# Patient Record
Sex: Male | Born: 1955 | Race: White | Hispanic: No | Smoking: Former smoker
Health system: Southern US, Community
[De-identification: ages and names within clinical notes are randomized; demographics above are authoritative.]

## PROBLEM LIST (undated history)

## (undated) DIAGNOSIS — R5382 Chronic fatigue, unspecified: Secondary | ICD-10-CM

## (undated) DIAGNOSIS — Z972 Presence of dental prosthetic device (complete) (partial): Secondary | ICD-10-CM

## (undated) DIAGNOSIS — C3491 Malignant neoplasm of unspecified part of right bronchus or lung: Secondary | ICD-10-CM

## (undated) DIAGNOSIS — E039 Hypothyroidism, unspecified: Secondary | ICD-10-CM

## (undated) DIAGNOSIS — K635 Polyp of colon: Secondary | ICD-10-CM

## (undated) DIAGNOSIS — I10 Essential (primary) hypertension: Secondary | ICD-10-CM

## (undated) DIAGNOSIS — E785 Hyperlipidemia, unspecified: Secondary | ICD-10-CM

## (undated) DIAGNOSIS — F209 Schizophrenia, unspecified: Secondary | ICD-10-CM

## (undated) HISTORY — PX: COLONOSCOPY: SHX174

## (undated) HISTORY — DX: Malignant neoplasm of unspecified part of right bronchus or lung: C34.91

---

## 2004-03-10 ENCOUNTER — Ambulatory Visit: Payer: Self-pay | Admitting: Internal Medicine

## 2004-03-23 ENCOUNTER — Ambulatory Visit: Payer: Self-pay | Admitting: Internal Medicine

## 2004-04-06 ENCOUNTER — Ambulatory Visit: Payer: Self-pay | Admitting: Internal Medicine

## 2008-09-10 ENCOUNTER — Ambulatory Visit: Payer: Self-pay | Admitting: Gastroenterology

## 2014-07-03 ENCOUNTER — Ambulatory Visit: Payer: Self-pay | Admitting: Internal Medicine

## 2014-07-03 ENCOUNTER — Ambulatory Visit
Admit: 2014-07-03 | Disposition: A | Discharge status: Home / Self Care | Payer: Self-pay | Attending: Internal Medicine | Admitting: Internal Medicine

## 2014-07-09 ENCOUNTER — Ambulatory Visit
Admit: 2014-07-09 | Disposition: A | Discharge status: Home / Self Care | Payer: Self-pay | Attending: Internal Medicine | Admitting: Internal Medicine

## 2014-07-11 LAB — CBC CANCER CENTER
Basophil #: 0 x10 3/mm (ref 0.0–0.1)
Basophil %: 0.1 %
EOS PCT: 1.1 %
Eosinophil #: 0.1 x10 3/mm (ref 0.0–0.7)
HCT: 36.4 % — ABNORMAL LOW (ref 40.0–52.0)
HGB: 12 g/dL — ABNORMAL LOW (ref 13.0–18.0)
Lymphocyte #: 1.7 x10 3/mm (ref 1.0–3.6)
Lymphocyte %: 16.7 %
MCH: 30.1 pg (ref 26.0–34.0)
MCHC: 33 g/dL (ref 32.0–36.0)
MCV: 91 fL (ref 80–100)
MONO ABS: 1 x10 3/mm (ref 0.2–1.0)
MONOS PCT: 10.5 %
Neutrophil #: 7.1 x10 3/mm — ABNORMAL HIGH (ref 1.4–6.5)
Neutrophil %: 71.6 %
PLATELETS: 386 x10 3/mm (ref 150–440)
RBC: 4 10*6/uL — ABNORMAL LOW (ref 4.40–5.90)
RDW: 11.9 % (ref 11.5–14.5)
WBC: 9.9 x10 3/mm (ref 3.8–10.6)

## 2014-07-11 LAB — HEPATIC FUNCTION PANEL A (ARMC)
ALBUMIN: 3.4 g/dL — AB
ALT: 115 U/L — AB
Alkaline Phosphatase: 186 U/L — ABNORMAL HIGH
Bilirubin, Direct: <0.1 mg/dL
Bilirubin,Total: 0.3 mg/dL
SGOT(AST): 87 U/L — ABNORMAL HIGH
TOTAL PROTEIN: 6.9 g/dL

## 2014-07-11 LAB — CREATININE, SERUM
CREATININE: 1.16 mg/dL
EGFR (African American): >60
EGFR (Non-African Amer.): >60

## 2014-07-11 LAB — CALCIUM: CALCIUM: 9.2 mg/dL

## 2014-07-14 LAB — CEA: CEA: 3.6 ng/mL (ref 0.0–4.7)

## 2014-07-17 ENCOUNTER — Ambulatory Visit
Admit: 2014-07-17 | Disposition: A | Discharge status: Home / Self Care | Payer: Self-pay | Attending: Internal Medicine | Admitting: Internal Medicine

## 2014-07-18 ENCOUNTER — Ambulatory Visit
Admit: 2014-07-18 | Disposition: A | Discharge status: Home / Self Care | Payer: Self-pay | Attending: Internal Medicine | Admitting: Internal Medicine

## 2014-07-23 LAB — CBC CANCER CENTER
BASOS PCT: 0.1 %
Basophil #: 0 x10 3/mm (ref 0.0–0.1)
Eosinophil #: 0.1 x10 3/mm (ref 0.0–0.7)
Eosinophil %: 0.6 %
HCT: 37.4 % — ABNORMAL LOW (ref 40.0–52.0)
HGB: 12.6 g/dL — AB (ref 13.0–18.0)
Lymphocyte #: 2.2 x10 3/mm (ref 1.0–3.6)
Lymphocyte %: 21.9 %
MCH: 30.2 pg (ref 26.0–34.0)
MCHC: 33.7 g/dL (ref 32.0–36.0)
MCV: 90 fL (ref 80–100)
Monocyte #: 1 x10 3/mm (ref 0.2–1.0)
Monocyte %: 10.3 %
Neutrophil #: 6.6 x10 3/mm — ABNORMAL HIGH (ref 1.4–6.5)
Neutrophil %: 67.1 %
Platelet: 470 x10 3/mm — ABNORMAL HIGH (ref 150–440)
RBC: 4.17 10*6/uL — AB (ref 4.40–5.90)
RDW: 12 % (ref 11.5–14.5)
WBC: 9.9 x10 3/mm (ref 3.8–10.6)

## 2014-07-23 LAB — HEPATIC FUNCTION PANEL A (ARMC)
ALT: 24 U/L
AST: 24 U/L
Albumin: 4.2 g/dL
Alkaline Phosphatase: 108 U/L
Bilirubin, Direct: <0.1 mg/dL
Bilirubin,Total: 0.5 mg/dL
Total Protein: 7.6 g/dL

## 2014-07-23 LAB — CREATININE, SERUM
Creatinine: 1.02 mg/dL
EGFR (African American): >60

## 2014-07-23 LAB — APTT: Activated PTT: 35.3 secs (ref 23.6–35.9)

## 2014-07-23 LAB — PROTIME-INR
INR: 1
PROTHROMBIN TIME: 13.3 s

## 2014-07-28 ENCOUNTER — Ambulatory Visit
Admit: 2014-07-28 | Disposition: A | Discharge status: Home / Self Care | Payer: Self-pay | Attending: Internal Medicine | Admitting: Internal Medicine

## 2014-07-28 LAB — BASIC METABOLIC PANEL
Anion Gap: 9 (ref 7–16)
BUN: 19 mg/dL
CO2: 25 mmol/L
Calcium, Total: 9.1 mg/dL
Chloride: 101 mmol/L
Creatinine: 1.36 mg/dL — ABNORMAL HIGH
GFR CALC NON AF AMER: 57 — AB
Glucose: 135 mg/dL — ABNORMAL HIGH
POTASSIUM: 4.3 mmol/L
SODIUM: 135 mmol/L

## 2014-07-30 ENCOUNTER — Ambulatory Visit
Admit: 2014-07-30 | Disposition: A | Discharge status: Home / Self Care | Payer: Self-pay | Attending: Internal Medicine | Admitting: Internal Medicine

## 2014-07-30 HISTORY — PX: ENDOBRONCHIAL ULTRASOUND: SHX5096

## 2014-07-31 ENCOUNTER — Institutional Professional Consult (permissible substitution): Payer: Self-pay | Admitting: Internal Medicine

## 2014-08-06 ENCOUNTER — Institutional Professional Consult (permissible substitution): Payer: Self-pay | Admitting: Pulmonary Disease

## 2014-08-11 LAB — CYTOLOGY - NON PAP

## 2014-08-18 ENCOUNTER — Ambulatory Visit: Payer: Medicare Other | Admitting: Radiation Oncology

## 2014-08-18 NOTE — Consult Note (Signed)
Reason for Visit: This 59 year old Male patient presents to the clinic for initial evaluation of  Lung cancer .   Referred by Dr Ma Hillock.  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  Pathology Report Pathology report reviewed   Imaging Report PET/CT and CT scans reviewed   Referral Report Clinical notes reviewed   Planned Treatment Regimen Concurrent chemotherapy and radiation therapy with curative intent   HPI   Patient is a 59 year old male presented with some slight weight loss and increasing dyspnea on exertion and intermittent cough. Chest x-ray and CT scan demonstrated a right hilar mass. Went on to have a PET CT scan demonstrating hypermetabolic central right hilar node concerning for primary bronchogenic carcinoma. There is also hypermetabolic nodule in the left upper lobe which may represent a secondary primary. He also had to hypermetabolic nodules in the right middle lobe concerning for either primary disease or metastatic disease. Bronchoscopy was performed and cytology was positive for squamous cell carcinoma. I have been asked to die with the patient for possible radiation therapy. He is doing fairly well. Having no pain at this time. PET CT did not demonstrate any disease outside his chest.  Past, Family and Social History:  Past Medical History positive   Cardiovascular hyperlipidemia; hypertension   Endocrine diabetes mellitus; hypothyroidism   Past Medical History Comments Chronic fatigue   Family History positive   Family History Comments Family history of stomach cancer   Social History positive   Social History Comments 30 year pack smoking history claims only 5 cigarettes a day. Social EtOH use history   Additional Past Medical and Surgical History Seen by himself today   Allergies:   No Known Allergies:    Home Meds:  Home Medications: Medication Instructions Status  dexamethasone 4 mg oral tablet Take 2 tablets  (8 mg) orally at 10PM the night before and 2 tablets (8 mg) at 7AM the morning of Taxol chemo.  (first dose of Taxol treatment is planned on 08/26/14) Active  hydrochlorothiazide 25 mg oral tablet 1 tab(s) orally once a day Active  levothyroxine 88 mcg (0.088 mg) oral capsule 1 cap(s) orally once a day Active  lisinopril 5 mg oral tablet 1 tab(s) orally once a day Active  OLANZapine 20 mg oral tablet 2 tab(s) orally once a day (at bedtime) Active  atorvastatin 10 mg oral tablet 1 tab(s) orally once a day (at bedtime) Active  Vitamin D 50,000 units 1 tab(s) orally once a week Active  propranolol 10 mg oral tablet 1 tab(s) orally 3 times a day Active   Review of Systems:  General negative   Performance Status (ECOG) 0   Skin negative   Breast negative   Ophthalmologic negative   ENMT negative   Respiratory and Thorax negative   Cardiovascular negative   Gastrointestinal negative   Genitourinary negative   Musculoskeletal negative   Neurological negative   Psychiatric negative   Hematology/Lymphatics negative   Endocrine negative   Allergic/Immunologic negative   Review of Systems   denies any weight loss, fatigue, weakness, fever, chills or night sweats. Patient denies any loss of vision, blurred vision. Patient denies any ringing  of the ears or hearing loss. No irregular heartbeat. Patient denies heart murmur or history of fainting. Patient denies any chest pain or pain radiating to her upper extremities. Patient denies any  shortness of breath, difficulty breathing at night, cough or hemoptysis. Patient denies any swelling in the lower legs. Patient denies any nausea vomiting, vomiting of blood, or coffee ground material in the vomitus. Patient denies any stomach pain. Patient states has had normal bowel movements no significant constipation or diarrhea.  Patient denies any dysuria, hematuria or significant nocturia. Patient denies any problems walking, swelling in the joints or loss of balance. Patient denies any skin changes, loss of hair or loss of weight. Patient denies any excessive worrying or anxiety or significant depression. Patient denies any problems with insomnia. Patient denies excessive thirst, polyuria, polydipsia. Patient denies any swollen glands, patient denies easy bruising or easy bleeding. Patient denies any recent infections, allergies or URI. Patient "s visual fields have not changed significantly in recent time.   Physical Exam:  General/Skin/HEENT:  General normal   Skin normal   Eyes normal   ENMT normal   Head and Neck normal   Additional PE Well-developed well-nourished male in NAD. Lungs are clear to A&P cardiac examination shows regular rate and rhythm abdomen is benign. No cervical or supraclavicular adenopathy is identified.   Breasts/Resp/CV/GI/GU:  Respiratory and Thorax normal   Cardiovascular normal   Gastrointestinal normal   Genitourinary normal   MS/Neuro/Psych/Lymph:  Musculoskeletal normal   Neurological normal   Lymphatics normal   Other Results:  Radiology Results: Nuclear Med:    31-Mar-16 09:57, PET/CT Scan Lung Cancer Diagnosis  PET/CT Scan Lung Cancer Diagnosis   REASON FOR EXAM:    Newly Diagnosed RUL Lung Nodule  COMMENTS:       PROCEDURE: PET - PET/CT DX LUNG CA  - Jul 17 2014  9:57AM     CLINICAL DATA:  Initial treatment strategy for lung carcinoma. Right  upper lobe lung nodule.Marland Kitchen    EXAM:  NUCLEAR MEDICINE PET SKULL BASE TO THIGH    TECHNIQUE:  12.8 mCi F-18 FDG was injected intravenously. Full-ring PET imaging  was performed from the skull base to thigh after the radiotracer. CT  data was obtained and used for attenuation correction and anatomic  localization.    FASTING BLOOD GLUCOSE:  Value: 105 mg/dl    COMPARISON:  CT  03/23/2004    FINDINGS:  NECK    No hypermetabolic lymph nodes in the neck.    CHEST    There is a central hypermetabolic nodule in the right hilum which is  difficult to measure on the noncontrast CT - 15 mm (image 163 of  series 3). This nodule has intense metabolic activity SUV max equal  16.5. Peripheral to this nodule there is right upper lobe  consolidation. This consolidative process measures 5.1 x 3.0 cm and  has moderate metabolic activity with SUV max equaled 8.8.    There is a second hypermetabolic nodule in the right middle lobe  which is small measuring 5 mm with SUV max equal 2.6. Additional  hypermetabolic nodule along the right middle lobe bronchus on image  112 of the fused data set.    Within the left upper lobe 15 mm irregular nodule (image 132, series  3) has moderate metabolic activity with SUV max equal 4.6    ABDOMEN/PELVIS  No abnormal metabolic activity within liver. Gallbladder normal. No  hypermetabolic abdominal lymph nodes. Mild activity through the  distal esophagus likely related to esophagitis.    SKELETON    No focal hypermetabolic activity to suggest skeletal metastasis.     IMPRESSION:  1. Hypermetabolic central right hilar nodule  is concern for primary  bronchogenic carcinoma.  2. Consolidative process peripheral to this central nodule is felt  to represent postobstructive pneumonitis; however cannot exclude  local tumor extension.  3. Hypermetabolic nodule associated witha central right middle lobe  bronchus is concern for other foci of carcinoma.  4. Hypermetabolic nodule in the parenchyma of the right middle lobe  concern for metastatic disease.  5. Hypermetabolic nodule the left upper lobe either represents a  secondary primary or metastatic disease.  6. No evidence of hypermetabolic mediastinal lymph nodes or distant  metastasis.      Electronically Signed    By: Suzy Bouchard M.D.    On: 07/17/2014 11:13          Verified By: Rennis Golden, M.D.,   Relevent Results:   Relevant Scans and Labs PET/CT and CT scans are reviewed and compatible with the above-stated findings.   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 Plan:   I have personally discussed the case with medical oncology. Can treat the entire hypermetabolic regions of the right lung within 1 treatment field with curative intent along with systemic chemotherapy. Faint left lung nodule which is slightly hypermetabolic I would continue to observe at this time and could always use SB RT on that if it becomes clear we are dealing with another malignancy. Risks and benefits of treatment including increasing dysphagia from possible radiation esophagitis, alteration of blood counts, increasing pulmonary symptoms secondary to loss of normal lung volume skin reaction fatigue all were discussed in detail with the patient. He seems to comprehend my treatment plan well. I have set up and ordered CT simulation on him for next week. Patient also be scheduled for concurrent chemotherapy.  I would like to take this opportunity for allowing me to participate in the care of your patient..  Electronic Signatures: Armstead Peaks (MD)  (Signed [phone] 11:42)  Authored: HPI, Diagnosis, PFSH, Allergies, Home Meds, ROS, Physical Exam, Other Results, Relevent Results, Encounter Assessment and Plan   Last Updated: 02-May-16 11:42 by Armstead Peaks (MD)

## 2014-08-19 ENCOUNTER — Inpatient Hospital Stay: Payer: Medicare Other | Attending: Internal Medicine

## 2014-08-19 DIAGNOSIS — Z79899 Other long term (current) drug therapy: Secondary | ICD-10-CM | POA: Insufficient documentation

## 2014-08-19 DIAGNOSIS — J449 Chronic obstructive pulmonary disease, unspecified: Secondary | ICD-10-CM | POA: Insufficient documentation

## 2014-08-19 DIAGNOSIS — E785 Hyperlipidemia, unspecified: Secondary | ICD-10-CM | POA: Insufficient documentation

## 2014-08-19 DIAGNOSIS — Z5111 Encounter for antineoplastic chemotherapy: Secondary | ICD-10-CM | POA: Insufficient documentation

## 2014-08-19 DIAGNOSIS — I10 Essential (primary) hypertension: Secondary | ICD-10-CM | POA: Insufficient documentation

## 2014-08-19 DIAGNOSIS — R5382 Chronic fatigue, unspecified: Secondary | ICD-10-CM | POA: Insufficient documentation

## 2014-08-19 DIAGNOSIS — C3411 Malignant neoplasm of upper lobe, right bronchus or lung: Secondary | ICD-10-CM | POA: Insufficient documentation

## 2014-08-19 DIAGNOSIS — E119 Type 2 diabetes mellitus without complications: Secondary | ICD-10-CM | POA: Insufficient documentation

## 2014-08-19 DIAGNOSIS — R918 Other nonspecific abnormal finding of lung field: Secondary | ICD-10-CM | POA: Insufficient documentation

## 2014-08-19 NOTE — Patient Instructions (Addendum)
Paclitaxel injection What is this medicine? PACLITAXEL (PAK li TAX el) is a chemotherapy drug. It targets fast dividing cells, like cancer cells, and causes these cells to die. This medicine is used to treat ovarian cancer, breast cancer, and other cancers. This medicine may be used for other purposes; ask your health care provider or pharmacist if you have questions. COMMON BRAND NAME(S): Onxol, Taxol What should I tell my health care provider before I take this medicine? They need to know if you have any of these conditions: -blood disorders -irregular heartbeat -infection (especially a virus infection such as chickenpox, cold sores, or herpes) -liver disease -previous or ongoing radiation therapy -an unusual or allergic reaction to paclitaxel, alcohol, polyoxyethylated castor oil, other chemotherapy agents, other medicines, foods, dyes, or preservatives -pregnant or trying to get pregnant -breast-feeding How should I use this medicine? This drug is given as an infusion into a vein. It is administered in a hospital or clinic by a specially trained health care professional. Talk to your pediatrician regarding the use of this medicine in children. Special care may be needed. Overdosage: If you think you have taken too much of this medicine contact a poison control center or emergency room at once. NOTE: This medicine is only for you. Do not share this medicine with others. What if I miss a dose? It is important not to miss your dose. Call your doctor or health care professional if you are unable to keep an appointment. What may interact with this medicine? Do not take this medicine with any of the following medications: -disulfiram -metronidazole This medicine may also interact with the following medications: -cyclosporine -diazepam -ketoconazole -medicines to increase blood counts like filgrastim, pegfilgrastim, sargramostim -other chemotherapy drugs like cisplatin, doxorubicin,  epirubicin, etoposide, teniposide, vincristine -quinidine -testosterone -vaccines -verapamil Talk to your doctor or health care professional before taking any of these medicines: -acetaminophen -aspirin -ibuprofen -ketoprofen -naproxen This list may not describe all possible interactions. Give your health care provider a list of all the medicines, herbs, non-prescription drugs, or dietary supplements you use. Also tell them if you smoke, drink alcohol, or use illegal drugs. Some items may interact with your medicine. What should I watch for while using this medicine? Your condition will be monitored carefully while you are receiving this medicine. You will need important blood work done while you are taking this medicine. This drug may make you feel generally unwell. This is not uncommon, as chemotherapy can affect healthy cells as well as cancer cells. Report any side effects. Continue your course of treatment even though you feel ill unless your doctor tells you to stop. In some cases, you may be given additional medicines to help with side effects. Follow all directions for their use. Call your doctor or health care professional for advice if you get a fever, chills or sore throat, or other symptoms of a cold or flu. Do not treat yourself. This drug decreases your body's ability to fight infections. Try to avoid being around people who are sick. This medicine may increase your risk to bruise or bleed. Call your doctor or health care professional if you notice any unusual bleeding. Be careful brushing and flossing your teeth or using a toothpick because you may get an infection or bleed more easily. If you have any dental work done, tell your dentist you are receiving this medicine. Avoid taking products that contain aspirin, acetaminophen, ibuprofen, naproxen, or ketoprofen unless instructed by your doctor. These medicines may hide a fever.   Do not become pregnant while taking this medicine.  Women should inform their doctor if they wish to become pregnant or think they might be pregnant. There is a potential for serious side effects to an unborn child. Talk to your health care professional or pharmacist for more information. Do not breast-feed an infant while taking this medicine. Men are advised not to father a child while receiving this medicine. What side effects may I notice from receiving this medicine? Side effects that you should report to your doctor or health care professional as soon as possible: -allergic reactions like skin rash, itching or hives, swelling of the face, lips, or tongue -low blood counts - This drug may decrease the number of white blood cells, red blood cells and platelets. You may be at increased risk for infections and bleeding. -signs of infection - fever or chills, cough, sore throat, pain or difficulty passing urine -signs of decreased platelets or bleeding - bruising, pinpoint red spots on the skin, black, tarry stools, nosebleeds -signs of decreased red blood cells - unusually weak or tired, fainting spells, lightheadedness -breathing problems -chest pain -high or low blood pressure -mouth sores -nausea and vomiting -pain, swelling, redness or irritation at the injection site -pain, tingling, numbness in the hands or feet -slow or irregular heartbeat -swelling of the ankle, feet, hands Side effects that usually do not require medical attention (report to your doctor or health care professional if they continue or are bothersome): -bone pain -complete hair loss including hair on your head, underarms, pubic hair, eyebrows, and eyelashes -changes in the color of fingernails -diarrhea -loosening of the fingernails -loss of appetite -muscle or joint pain -red flush to skin -sweating This list may not describe all possible side effects. Call your doctor for medical advice about side effects. You may report side effects to FDA at  1-800-FDA-1088. Where should I keep my medicine? This drug is given in a hospital or clinic and will not be stored at home. NOTE: This sheet is a summary. It may not cover all possible information. If you have questions about this medicine, talk to your doctor, pharmacist, or health care provider.  2015, Elsevier/Gold Standard. (2012-05-28 16:41:21)  Carboplatin injection What is this medicine? CARBOPLATIN (KAR boe pla tin) is a chemotherapy drug. It targets fast dividing cells, like cancer cells, and causes these cells to die. This medicine is used to treat ovarian cancer and many other cancers. This medicine may be used for other purposes; ask your health care provider or pharmacist if you have questions. COMMON BRAND NAME(S): Paraplatin What should I tell my health care provider before I take this medicine? They need to know if you have any of these conditions: -blood disorders -hearing problems -kidney disease -recent or ongoing radiation therapy -an unusual or allergic reaction to carboplatin, cisplatin, other chemotherapy, other medicines, foods, dyes, or preservatives -pregnant or trying to get pregnant -breast-feeding How should I use this medicine? This drug is usually given as an infusion into a vein. It is administered in a hospital or clinic by a specially trained health care professional. Talk to your pediatrician regarding the use of this medicine in children. Special care may be needed. Overdosage: If you think you have taken too much of this medicine contact a poison control center or emergency room at once. NOTE: This medicine is only for you. Do not share this medicine with others. What if I miss a dose? It is important not to miss a dose. Call your   doctor or health care professional if you are unable to keep an appointment. What may interact with this medicine? -medicines for seizures -medicines to increase blood counts like filgrastim, pegfilgrastim,  sargramostim -some antibiotics like amikacin, gentamicin, neomycin, streptomycin, tobramycin -vaccines Talk to your doctor or health care professional before taking any of these medicines: -acetaminophen -aspirin -ibuprofen -ketoprofen -naproxen This list may not describe all possible interactions. Give your health care provider a list of all the medicines, herbs, non-prescription drugs, or dietary supplements you use. Also tell them if you smoke, drink alcohol, or use illegal drugs. Some items may interact with your medicine. What should I watch for while using this medicine? Your condition will be monitored carefully while you are receiving this medicine. You will need important blood work done while you are taking this medicine. This drug may make you feel generally unwell. This is not uncommon, as chemotherapy can affect healthy cells as well as cancer cells. Report any side effects. Continue your course of treatment even though you feel ill unless your doctor tells you to stop. In some cases, you may be given additional medicines to help with side effects. Follow all directions for their use. Call your doctor or health care professional for advice if you get a fever, chills or sore throat, or other symptoms of a cold or flu. Do not treat yourself. This drug decreases your body's ability to fight infections. Try to avoid being around people who are sick. This medicine may increase your risk to bruise or bleed. Call your doctor or health care professional if you notice any unusual bleeding. Be careful brushing and flossing your teeth or using a toothpick because you may get an infection or bleed more easily. If you have any dental work done, tell your dentist you are receiving this medicine. Avoid taking products that contain aspirin, acetaminophen, ibuprofen, naproxen, or ketoprofen unless instructed by your doctor. These medicines may hide a fever. Do not become pregnant while taking this  medicine. Women should inform their doctor if they wish to become pregnant or think they might be pregnant. There is a potential for serious side effects to an unborn child. Talk to your health care professional or pharmacist for more information. Do not breast-feed an infant while taking this medicine. What side effects may I notice from receiving this medicine? Side effects that you should report to your doctor or health care professional as soon as possible: -allergic reactions like skin rash, itching or hives, swelling of the face, lips, or tongue -signs of infection - fever or chills, cough, sore throat, pain or difficulty passing urine -signs of decreased platelets or bleeding - bruising, pinpoint red spots on the skin, black, tarry stools, nosebleeds -signs of decreased red blood cells - unusually weak or tired, fainting spells, lightheadedness -breathing problems -changes in hearing -changes in vision -chest pain -high blood pressure -low blood counts - This drug may decrease the number of white blood cells, red blood cells and platelets. You may be at increased risk for infections and bleeding. -nausea and vomiting -pain, swelling, redness or irritation at the injection site -pain, tingling, numbness in the hands or feet -problems with balance, talking, walking -trouble passing urine or change in the amount of urine Side effects that usually do not require medical attention (report to your doctor or health care professional if they continue or are bothersome): -hair loss -loss of appetite -metallic taste in the mouth or changes in taste This list may not describe all   possible side effects. Call your doctor for medical advice about side effects. You may report side effects to FDA at 1-800-FDA-1088. Where should I keep my medicine? This drug is given in a hospital or clinic and will not be stored at home. NOTE: This sheet is a summary. It may not cover all possible information. If you  have questions about this medicine, talk to your doctor, pharmacist, or health care provider.  2015, Elsevier/Gold Standard. (2007-07-10 14:38:05)  

## 2014-08-19 NOTE — Progress Notes (Signed)
Taxol and Carboplatin

## 2014-08-20 ENCOUNTER — Ambulatory Visit
Admission: RE | Admit: 2014-08-20 | Discharge: 2014-08-20 | Disposition: A | Discharge status: Home / Self Care | Payer: Medicare Other | Source: Ambulatory Visit | Attending: Radiation Oncology | Admitting: Radiation Oncology

## 2014-08-20 DIAGNOSIS — C3411 Malignant neoplasm of upper lobe, right bronchus or lung: Secondary | ICD-10-CM | POA: Insufficient documentation

## 2014-08-20 DIAGNOSIS — Z510 Encounter for antineoplastic radiation therapy: Secondary | ICD-10-CM | POA: Insufficient documentation

## 2014-08-21 ENCOUNTER — Encounter
Admission: AD | Disposition: A | Discharge status: Home / Self Care | Payer: Self-pay | Source: Ambulatory Visit | Attending: Vascular Surgery

## 2014-08-21 ENCOUNTER — Ambulatory Visit
Admission: AD | Admit: 2014-08-21 | Discharge: 2014-08-21 | Disposition: A | Discharge status: Home / Self Care | Payer: Medicare Other | Source: Ambulatory Visit | Attending: Vascular Surgery | Admitting: Vascular Surgery

## 2014-08-21 ENCOUNTER — Ambulatory Visit: Admission: RE | Admit: 2014-08-21 | Payer: Medicare Other | Source: Ambulatory Visit | Admitting: Vascular Surgery

## 2014-08-21 ENCOUNTER — Encounter: Payer: Self-pay | Admitting: *Deleted

## 2014-08-21 DIAGNOSIS — I10 Essential (primary) hypertension: Secondary | ICD-10-CM | POA: Insufficient documentation

## 2014-08-21 DIAGNOSIS — C3490 Malignant neoplasm of unspecified part of unspecified bronchus or lung: Secondary | ICD-10-CM | POA: Insufficient documentation

## 2014-08-21 DIAGNOSIS — Z79899 Other long term (current) drug therapy: Secondary | ICD-10-CM | POA: Diagnosis not present

## 2014-08-21 DIAGNOSIS — E119 Type 2 diabetes mellitus without complications: Secondary | ICD-10-CM | POA: Diagnosis not present

## 2014-08-21 DIAGNOSIS — E039 Hypothyroidism, unspecified: Secondary | ICD-10-CM | POA: Insufficient documentation

## 2014-08-21 HISTORY — PX: PERIPHERAL VASCULAR CATHETERIZATION: SHX172C

## 2014-08-21 HISTORY — DX: Essential (primary) hypertension: I10

## 2014-08-21 HISTORY — DX: Chronic fatigue, unspecified: R53.82

## 2014-08-21 HISTORY — DX: Schizophrenia, unspecified: F20.9

## 2014-08-21 HISTORY — DX: Hyperlipidemia, unspecified: E78.5

## 2014-08-21 LAB — GLUCOSE, CAPILLARY: GLUCOSE-CAPILLARY: 108 mg/dL — AB (ref 70–99)

## 2014-08-21 LAB — GLUCOSE, POCT (MANUAL RESULT ENTRY): POC Glucose: 108 mg/dl — AB (ref 70–99)

## 2014-08-21 SURGERY — PORTA CATH INSERTION
Anesthesia: Moderate Sedation

## 2014-08-21 MED ORDER — SODIUM CHLORIDE 0.9 % IV SOLN
INTRAVENOUS | Status: DC
  Administered 2014-08-21: 10:00:00 via INTRAVENOUS

## 2014-08-21 MED ORDER — FENTANYL CITRATE (PF) 100 MCG/2ML IJ SOLN
INTRAMUSCULAR | Status: AC
  Filled 2014-08-21: qty 2

## 2014-08-21 MED ORDER — CEFAZOLIN SODIUM 1-5 GM-% IV SOLN
INTRAVENOUS | Status: AC
  Filled 2014-08-21: qty 50

## 2014-08-21 MED ORDER — HYDROMORPHONE HCL 1 MG/ML IJ SOLN: 1.0000 mg | Freq: Once | INTRAMUSCULAR | Status: DC | PRN

## 2014-08-21 MED ORDER — SODIUM CHLORIDE 0.9 % IR SOLN
Freq: Once | Status: DC
  Filled 2014-08-21: qty 2

## 2014-08-21 MED ORDER — FENTANYL CITRATE (PF) 100 MCG/2ML IJ SOLN: 50.0000 ug | Freq: Once | INTRAMUSCULAR | Status: DC

## 2014-08-21 MED ORDER — MIDAZOLAM HCL 5 MG/ML IJ SOLN
2.0000 mg | Freq: Once | INTRAMUSCULAR | Status: AC
  Administered 2014-08-21: 2 mg via INTRAVENOUS
  Filled 2014-08-21: qty 1

## 2014-08-21 MED ORDER — FENTANYL CITRATE (PF) 100 MCG/2ML IJ SOLN
50.0000 ug | Freq: Once | INTRAMUSCULAR | Status: AC
  Administered 2014-08-21: 100 ug via INTRAVENOUS

## 2014-08-21 MED ORDER — MIDAZOLAM HCL 5 MG/5ML IJ SOLN
INTRAMUSCULAR | Status: AC
  Filled 2014-08-21: qty 5

## 2014-08-21 MED ORDER — CEFAZOLIN SODIUM 1-5 GM-% IV SOLN
1.0000 g | Freq: Once | INTRAVENOUS | Status: AC
  Administered 2014-08-21: 1 g via INTRAVENOUS

## 2014-08-21 MED ORDER — ONDANSETRON HCL 4 MG/2ML IJ SOLN: 4.0000 mg | Freq: Once | INTRAMUSCULAR | Status: DC | PRN

## 2014-08-21 MED ORDER — ATROPINE SULFATE 1 MG/ML IJ SOLN
0.5000 mg | Freq: Once | INTRAMUSCULAR | Status: DC | PRN
  Filled 2014-08-21: qty 0.5

## 2014-08-21 MED ORDER — HEPARIN (PORCINE) IN NACL 2-0.9 UNIT/ML-% IJ SOLN
INTRAMUSCULAR | Status: AC
  Filled 2014-08-21: qty 500

## 2014-08-21 MED ORDER — LIDOCAINE-EPINEPHRINE (PF) 1 %-1:200000 IJ SOLN
INTRAMUSCULAR | Status: AC
  Filled 2014-08-21: qty 30

## 2014-08-21 SURGICAL SUPPLY — 3 items
PACK ANGIOGRAPHY (CUSTOM PROCEDURE TRAY) ×3 IMPLANT
PORTACATH POWER 8F (Port) ×3 IMPLANT
TOWEL OR 17X26 4PK STRL BLUE (TOWEL DISPOSABLE) ×3 IMPLANT

## 2014-08-21 NOTE — H&P (Signed)
[facility] History & Physical Update  The patient was interviewed and re-examined.  The patient's previous History and Physical has been reviewed and is unchanged.  There is no change in the plan of care.  Tritia Endo, MD  08/21/2014, 9:35 AM

## 2014-08-21 NOTE — Op Note (Signed)
Preoperative diagnosis: Lung cancer Postoperative diagnosis: Lung cancer Procedures: #1. Ultrasound guidance for vascular access to the left internal jugular vein. #2. Fluoroscopic guidance for placement of catheter. #3. Placement of CT compatible Port-A-Cath, left internal jugular vein.  Surgeon: Leotis Pain, MD. Anesthesia: Local with moderate conscious sedation. Fluoroscopy time less than 1 minute Contrast used: 0 Estimated blood loss: Minimal Indication for the procedure: The patient is a recently diagnosed lung cancer patient. He needs a Port-A-Cath for durable venous access, chemotherapy, lab draws, and CT scans. We are asked to place this. Risks and benefits were discussed and informed consent was obtained. Description of procedure: The patient was brought to the vascular and interventional radiology suite. The left neck chest and shoulder were sterilely prepped and draped, and a sterile surgical field was created. Ultrasound was used to help visualize a patent left internal jugular vein. This was then accessed under direct ultrasound guidance without difficulty with the Seldinger needle and a permanent image was recorded. A J-wire was placed. After skin nick and dilatation, the peel-away sheath was then placed over the wire. I then anesthetized an area under the clavicle approximately 1-2 fingerbreadths. A transverse incision was created and an inferior pocket was created with electrocautery and blunt dissection. The port was then brought onto the field, placed into the pocket and secured to the chest wall with 2 Prolene sutures. The catheter was connected to the port and tunneled from the subclavicular incision to the access site. Fluoroscopic guidance was then used to cut the catheter to an appropriate length. The catheter was then placed through the peel-away sheath and the peel-away sheath was removed. The catheter tip was parked in excellent location in the proximal superior vena cava. The  pocket was then irrigated with antibiotic impregnated saline and the wound was closed with a running 3-0 Vicryl and a 4-0 Monocryl. The access incision was closed with a single 4-0 Monocryl. The Huber needle was then used to withdraw blood and flush the port with heparinized saline. Dermabond was then placed as a dressing. The patient tolerated the procedure well and was taken to the recovery room in stable condition.

## 2014-08-22 ENCOUNTER — Encounter: Payer: Self-pay | Admitting: *Deleted

## 2014-08-22 ENCOUNTER — Other Ambulatory Visit: Payer: Self-pay | Admitting: *Deleted

## 2014-08-22 DIAGNOSIS — C3491 Malignant neoplasm of unspecified part of right bronchus or lung: Secondary | ICD-10-CM

## 2014-08-22 HISTORY — DX: Malignant neoplasm of unspecified part of right bronchus or lung: C34.91

## 2014-08-26 ENCOUNTER — Ambulatory Visit: Payer: Self-pay

## 2014-08-26 ENCOUNTER — Inpatient Hospital Stay: Payer: Medicare Other

## 2014-08-26 ENCOUNTER — Ambulatory Visit: Payer: Self-pay | Admitting: Internal Medicine

## 2014-08-26 ENCOUNTER — Inpatient Hospital Stay (HOSPITAL_BASED_OUTPATIENT_CLINIC_OR_DEPARTMENT_OTHER): Payer: Medicare Other | Admitting: Internal Medicine

## 2014-08-26 ENCOUNTER — Other Ambulatory Visit: Payer: Self-pay

## 2014-08-26 VITALS — BP 129/77 | HR 90 | Temp 95.5°F | Resp 18 | Ht 72.0 in | Wt 220.9 lb

## 2014-08-26 VITALS — BP 123/74 | HR 75 | Resp 18

## 2014-08-26 DIAGNOSIS — Z510 Encounter for antineoplastic radiation therapy: Secondary | ICD-10-CM | POA: Diagnosis not present

## 2014-08-26 DIAGNOSIS — E119 Type 2 diabetes mellitus without complications: Secondary | ICD-10-CM | POA: Diagnosis not present

## 2014-08-26 DIAGNOSIS — J449 Chronic obstructive pulmonary disease, unspecified: Secondary | ICD-10-CM | POA: Diagnosis not present

## 2014-08-26 DIAGNOSIS — Z5111 Encounter for antineoplastic chemotherapy: Secondary | ICD-10-CM | POA: Diagnosis not present

## 2014-08-26 DIAGNOSIS — Z79899 Other long term (current) drug therapy: Secondary | ICD-10-CM

## 2014-08-26 DIAGNOSIS — I10 Essential (primary) hypertension: Secondary | ICD-10-CM

## 2014-08-26 DIAGNOSIS — E785 Hyperlipidemia, unspecified: Secondary | ICD-10-CM

## 2014-08-26 DIAGNOSIS — C3491 Malignant neoplasm of unspecified part of right bronchus or lung: Secondary | ICD-10-CM

## 2014-08-26 DIAGNOSIS — R5382 Chronic fatigue, unspecified: Secondary | ICD-10-CM

## 2014-08-26 DIAGNOSIS — C3411 Malignant neoplasm of upper lobe, right bronchus or lung: Secondary | ICD-10-CM

## 2014-08-26 DIAGNOSIS — R918 Other nonspecific abnormal finding of lung field: Secondary | ICD-10-CM | POA: Diagnosis not present

## 2014-08-26 LAB — CBC WITH DIFFERENTIAL/PLATELET
BASOS ABS: 0 10*3/uL (ref 0–0.1)
BASOS PCT: 0 %
Eosinophils Absolute: 0 10*3/uL (ref 0–0.7)
Eosinophils Relative: 0 %
HCT: 38.2 % — ABNORMAL LOW (ref 40.0–52.0)
HEMOGLOBIN: 12.6 g/dL — AB (ref 13.0–18.0)
Lymphocytes Relative: 6 %
Lymphs Abs: 0.8 10*3/uL — ABNORMAL LOW (ref 1.0–3.6)
MCH: 29.5 pg (ref 26.0–34.0)
MCHC: 33.1 g/dL (ref 32.0–36.0)
MCV: 89.2 fL (ref 80.0–100.0)
MONO ABS: 0.1 10*3/uL — AB (ref 0.2–1.0)
Monocytes Relative: 1 %
NEUTROS ABS: 13.1 10*3/uL — AB (ref 1.4–6.5)
NEUTROS PCT: 93 %
PLATELETS: 282 10*3/uL (ref 150–440)
RBC: 4.28 MIL/uL — ABNORMAL LOW (ref 4.40–5.90)
RDW: 12.8 % (ref 11.5–14.5)
WBC: 14.1 10*3/uL — ABNORMAL HIGH (ref 3.8–10.6)

## 2014-08-26 LAB — COMPREHENSIVE METABOLIC PANEL
ALBUMIN: 4 g/dL (ref 3.5–5.0)
ALT: 21 U/L (ref 17–63)
ANION GAP: 8 (ref 5–15)
AST: 39 U/L (ref 15–41)
Alkaline Phosphatase: 93 U/L (ref 38–126)
BUN: 21 mg/dL — ABNORMAL HIGH (ref 6–20)
CO2: 24 mmol/L (ref 22–32)
CREATININE: 0.98 mg/dL (ref 0.61–1.24)
Calcium: 9.2 mg/dL (ref 8.9–10.3)
Chloride: 100 mmol/L — ABNORMAL LOW (ref 101–111)
GFR calc Af Amer: >60 mL/min (ref >60)
GFR calc non Af Amer: >60 mL/min (ref >60)
Glucose, Bld: 221 mg/dL — ABNORMAL HIGH (ref 65–99)
Potassium: 4.1 mmol/L (ref 3.5–5.1)
Sodium: 132 mmol/L — ABNORMAL LOW (ref 135–145)
Total Bilirubin: 0.5 mg/dL (ref 0.3–1.2)
Total Protein: 7.4 g/dL (ref 6.5–8.1)

## 2014-08-26 MED ORDER — DIPHENHYDRAMINE HCL 50 MG/ML IJ SOLN
25.0000 mg | Freq: Once | INTRAMUSCULAR | Status: AC
  Administered 2014-08-26: 25 mg via INTRAVENOUS
  Filled 2014-08-26: qty 1

## 2014-08-26 MED ORDER — DEXTROSE 5 % IV SOLN
60.0000 mg/m2 | Freq: Once | INTRAVENOUS | Status: AC
  Administered 2014-08-26: 138 mg via INTRAVENOUS
  Filled 2014-08-26: qty 23

## 2014-08-26 MED ORDER — HEPARIN SOD (PORK) LOCK FLUSH 100 UNIT/ML IV SOLN
500.0000 [IU] | Freq: Once | INTRAVENOUS | Status: AC | PRN
  Administered 2014-08-26: 500 [IU]
  Filled 2014-08-26: qty 5

## 2014-08-26 MED ORDER — SODIUM CHLORIDE 0.9 % IV SOLN
285.8000 mg | Freq: Once | INTRAVENOUS | Status: AC
  Administered 2014-08-26: 290 mg via INTRAVENOUS
  Filled 2014-08-26: qty 29

## 2014-08-26 MED ORDER — SODIUM CHLORIDE 0.9 % IV SOLN
Freq: Once | INTRAVENOUS | Status: AC
  Administered 2014-08-26: 12:00:00 via INTRAVENOUS
  Filled 2014-08-26: qty 5

## 2014-08-26 MED ORDER — PALONOSETRON HCL INJECTION 0.25 MG/5ML
0.2500 mg | Freq: Once | INTRAVENOUS | Status: AC
  Administered 2014-08-26: 0.25 mg via INTRAVENOUS
  Filled 2014-08-26: qty 5

## 2014-08-26 MED ORDER — DEXAMETHASONE SODIUM PHOSPHATE 100 MG/10ML IJ SOLN: Freq: Once | INTRAMUSCULAR | Status: DC

## 2014-08-26 MED ORDER — FAMOTIDINE IN NACL 20-0.9 MG/50ML-% IV SOLN
20.0000 mg | Freq: Once | INTRAVENOUS | Status: AC
  Administered 2014-08-26: 20 mg via INTRAVENOUS
  Filled 2014-08-26: qty 50

## 2014-08-26 MED ORDER — SODIUM CHLORIDE 0.9 % IV SOLN
Freq: Once | INTRAVENOUS | Status: AC
  Administered 2014-08-26: 11:00:00 via INTRAVENOUS
  Filled 2014-08-26: qty 250

## 2014-08-26 NOTE — Progress Notes (Signed)
Pt here for chemo today and states he has a non productive cough. In the past he used mucinex but not on anything right now for cough

## 2014-08-27 ENCOUNTER — Other Ambulatory Visit: Payer: Self-pay | Admitting: Internal Medicine

## 2014-08-29 ENCOUNTER — Encounter: Payer: Self-pay | Admitting: Vascular Surgery

## 2014-09-01 ENCOUNTER — Ambulatory Visit: Admission: RE | Admit: 2014-09-01 | Payer: Medicare Other | Source: Ambulatory Visit

## 2014-09-01 ENCOUNTER — Ambulatory Visit
Admission: RE | Admit: 2014-09-01 | Discharge: 2014-09-01 | Disposition: A | Discharge status: Home / Self Care | Payer: Medicare Other | Source: Ambulatory Visit | Attending: Radiation Oncology | Admitting: Radiation Oncology

## 2014-09-01 DIAGNOSIS — Z510 Encounter for antineoplastic radiation therapy: Secondary | ICD-10-CM | POA: Diagnosis not present

## 2014-09-02 ENCOUNTER — Inpatient Hospital Stay: Payer: Medicare Other

## 2014-09-02 ENCOUNTER — Ambulatory Visit
Admission: RE | Admit: 2014-09-02 | Discharge: 2014-09-02 | Disposition: A | Discharge status: Home / Self Care | Payer: Medicare Other | Source: Ambulatory Visit | Attending: Radiation Oncology | Admitting: Radiation Oncology

## 2014-09-02 ENCOUNTER — Other Ambulatory Visit: Payer: Medicare Other

## 2014-09-02 ENCOUNTER — Ambulatory Visit: Payer: Medicare Other

## 2014-09-02 VITALS — BP 117/73 | HR 79 | Temp 96.1°F | Resp 18

## 2014-09-02 DIAGNOSIS — C3491 Malignant neoplasm of unspecified part of right bronchus or lung: Secondary | ICD-10-CM

## 2014-09-02 DIAGNOSIS — Z5111 Encounter for antineoplastic chemotherapy: Secondary | ICD-10-CM | POA: Diagnosis not present

## 2014-09-02 DIAGNOSIS — Z510 Encounter for antineoplastic radiation therapy: Secondary | ICD-10-CM | POA: Diagnosis not present

## 2014-09-02 LAB — BASIC METABOLIC PANEL
ANION GAP: 8 (ref 5–15)
BUN: 29 mg/dL — ABNORMAL HIGH (ref 6–20)
CHLORIDE: 100 mmol/L — AB (ref 101–111)
CO2: 27 mmol/L (ref 22–32)
Calcium: 9 mg/dL (ref 8.9–10.3)
Creatinine, Ser: 0.99 mg/dL (ref 0.61–1.24)
GFR calc Af Amer: >60 mL/min (ref >60)
GFR calc non Af Amer: >60 mL/min (ref >60)
GLUCOSE: 118 mg/dL — AB (ref 65–99)
Potassium: 4.5 mmol/L (ref 3.5–5.1)
Sodium: 135 mmol/L (ref 135–145)

## 2014-09-02 LAB — CBC WITH DIFFERENTIAL/PLATELET
Basophils Absolute: 0 10*3/uL (ref 0–0.1)
Basophils Relative: 0 %
Eosinophils Absolute: 0.1 10*3/uL (ref 0–0.7)
Eosinophils Relative: 1 %
HCT: 37.9 % — ABNORMAL LOW (ref 40.0–52.0)
Hemoglobin: 12.4 g/dL — ABNORMAL LOW (ref 13.0–18.0)
LYMPHS ABS: 1.1 10*3/uL (ref 1.0–3.6)
Lymphocytes Relative: 11 %
MCH: 29.3 pg (ref 26.0–34.0)
MCHC: 32.8 g/dL (ref 32.0–36.0)
MCV: 89.4 fL (ref 80.0–100.0)
MONOS PCT: 2 %
Monocytes Absolute: 0.2 10*3/uL (ref 0.2–1.0)
Neutro Abs: 9.1 10*3/uL — ABNORMAL HIGH (ref 1.4–6.5)
Neutrophils Relative %: 86 %
Platelets: 273 10*3/uL (ref 150–440)
RBC: 4.24 MIL/uL — ABNORMAL LOW (ref 4.40–5.90)
RDW: 12.8 % (ref 11.5–14.5)
WBC: 10.5 10*3/uL (ref 3.8–10.6)

## 2014-09-02 LAB — HEPATIC FUNCTION PANEL
ALBUMIN: 4 g/dL (ref 3.5–5.0)
ALK PHOS: 90 U/L (ref 38–126)
ALT: 17 U/L (ref 17–63)
AST: 20 U/L (ref 15–41)
Bilirubin, Direct: <0.1 mg/dL — ABNORMAL LOW (ref 0.1–0.5)
TOTAL PROTEIN: 6.7 g/dL (ref 6.5–8.1)
Total Bilirubin: 0.7 mg/dL (ref 0.3–1.2)

## 2014-09-02 MED ORDER — CARBOPLATIN CHEMO INJECTION 450 MG/45ML
283.4000 mg | Freq: Once | INTRAVENOUS | Status: AC
  Administered 2014-09-02: 280 mg via INTRAVENOUS
  Filled 2014-09-02: qty 28

## 2014-09-02 MED ORDER — SODIUM CHLORIDE 0.9 % IV SOLN
Freq: Once | INTRAVENOUS | Status: AC
  Administered 2014-09-02: 12:00:00 via INTRAVENOUS
  Filled 2014-09-02: qty 8

## 2014-09-02 MED ORDER — SODIUM CHLORIDE 0.9 % IV SOLN
Freq: Once | INTRAVENOUS | Status: AC
  Administered 2014-09-02: 12:00:00 via INTRAVENOUS
  Filled 2014-09-02: qty 1000

## 2014-09-02 MED ORDER — DIPHENHYDRAMINE HCL 50 MG/ML IJ SOLN
25.0000 mg | Freq: Once | INTRAMUSCULAR | Status: AC
  Administered 2014-09-02: 25 mg via INTRAVENOUS
  Filled 2014-09-02: qty 1

## 2014-09-02 MED ORDER — PACLITAXEL CHEMO INJECTION 300 MG/50ML
60.0000 mg/m2 | Freq: Once | INTRAVENOUS | Status: AC
  Administered 2014-09-02: 138 mg via INTRAVENOUS
  Filled 2014-09-02: qty 23

## 2014-09-02 MED ORDER — HEPARIN SOD (PORK) LOCK FLUSH 100 UNIT/ML IV SOLN
500.0000 [IU] | Freq: Once | INTRAVENOUS | Status: AC | PRN
  Administered 2014-09-02: 500 [IU]
  Filled 2014-09-02: qty 5

## 2014-09-02 MED ORDER — FAMOTIDINE IN NACL 20-0.9 MG/50ML-% IV SOLN
20.0000 mg | Freq: Once | INTRAVENOUS | Status: AC
  Administered 2014-09-02: 20 mg via INTRAVENOUS
  Filled 2014-09-02: qty 50

## 2014-09-02 MED ORDER — SODIUM CHLORIDE 0.9 % IJ SOLN
10.0000 mL | INTRAMUSCULAR | Status: DC | PRN
  Administered 2014-09-02: 10 mL
  Filled 2014-09-02: qty 10

## 2014-09-03 ENCOUNTER — Ambulatory Visit
Admission: RE | Admit: 2014-09-03 | Discharge: 2014-09-03 | Disposition: A | Discharge status: Home / Self Care | Payer: Medicare Other | Source: Ambulatory Visit | Attending: Radiation Oncology | Admitting: Radiation Oncology

## 2014-09-03 DIAGNOSIS — Z510 Encounter for antineoplastic radiation therapy: Secondary | ICD-10-CM | POA: Diagnosis not present

## 2014-09-04 ENCOUNTER — Ambulatory Visit
Admission: RE | Admit: 2014-09-04 | Discharge: 2014-09-04 | Disposition: A | Discharge status: Home / Self Care | Payer: Medicare Other | Source: Ambulatory Visit | Attending: Radiation Oncology | Admitting: Radiation Oncology

## 2014-09-04 DIAGNOSIS — Z510 Encounter for antineoplastic radiation therapy: Secondary | ICD-10-CM | POA: Diagnosis not present

## 2014-09-05 ENCOUNTER — Ambulatory Visit
Admission: RE | Admit: 2014-09-05 | Discharge: 2014-09-05 | Disposition: A | Discharge status: Home / Self Care | Payer: Medicare Other | Source: Ambulatory Visit | Attending: Radiation Oncology | Admitting: Radiation Oncology

## 2014-09-05 DIAGNOSIS — Z510 Encounter for antineoplastic radiation therapy: Secondary | ICD-10-CM | POA: Diagnosis not present

## 2014-09-08 ENCOUNTER — Ambulatory Visit
Admission: RE | Admit: 2014-09-08 | Discharge: 2014-09-08 | Disposition: A | Discharge status: Home / Self Care | Payer: Medicare Other | Source: Ambulatory Visit | Attending: Radiation Oncology | Admitting: Radiation Oncology

## 2014-09-08 DIAGNOSIS — Z510 Encounter for antineoplastic radiation therapy: Secondary | ICD-10-CM | POA: Diagnosis not present

## 2014-09-09 ENCOUNTER — Ambulatory Visit: Payer: Medicare Other

## 2014-09-09 ENCOUNTER — Inpatient Hospital Stay: Payer: Medicare Other

## 2014-09-09 ENCOUNTER — Ambulatory Visit
Admission: RE | Admit: 2014-09-09 | Discharge: 2014-09-09 | Disposition: A | Discharge status: Home / Self Care | Payer: Medicare Other | Source: Ambulatory Visit | Attending: Radiation Oncology | Admitting: Radiation Oncology

## 2014-09-09 ENCOUNTER — Other Ambulatory Visit: Payer: Medicare Other

## 2014-09-09 VITALS — BP 108/69 | HR 78 | Temp 96.3°F | Resp 18

## 2014-09-09 DIAGNOSIS — Z510 Encounter for antineoplastic radiation therapy: Secondary | ICD-10-CM | POA: Diagnosis not present

## 2014-09-09 DIAGNOSIS — C3491 Malignant neoplasm of unspecified part of right bronchus or lung: Secondary | ICD-10-CM

## 2014-09-09 DIAGNOSIS — Z5111 Encounter for antineoplastic chemotherapy: Secondary | ICD-10-CM | POA: Diagnosis not present

## 2014-09-09 LAB — CBC WITH DIFFERENTIAL/PLATELET
Basophils Absolute: 0 10*3/uL (ref 0–0.1)
Basophils Relative: 0 %
EOS PCT: 1 %
Eosinophils Absolute: 0 10*3/uL (ref 0–0.7)
HCT: 34 % — ABNORMAL LOW (ref 40.0–52.0)
Hemoglobin: 11.3 g/dL — ABNORMAL LOW (ref 13.0–18.0)
LYMPHS PCT: 10 %
Lymphs Abs: 0.5 10*3/uL — ABNORMAL LOW (ref 1.0–3.6)
MCH: 29.7 pg (ref 26.0–34.0)
MCHC: 33.2 g/dL (ref 32.0–36.0)
MCV: 89.6 fL (ref 80.0–100.0)
Monocytes Absolute: 0.3 10*3/uL (ref 0.2–1.0)
Monocytes Relative: 6 %
NEUTROS ABS: 4.3 10*3/uL (ref 1.4–6.5)
NEUTROS PCT: 83 %
Platelets: 229 10*3/uL (ref 150–440)
RBC: 3.8 MIL/uL — ABNORMAL LOW (ref 4.40–5.90)
RDW: 12.7 % (ref 11.5–14.5)
WBC: 5.2 10*3/uL (ref 3.8–10.6)

## 2014-09-09 LAB — CREATININE, SERUM
Creatinine, Ser: 0.97 mg/dL (ref 0.61–1.24)
GFR calc non Af Amer: >60 mL/min (ref >60)

## 2014-09-09 MED ORDER — SODIUM CHLORIDE 0.9 % IV SOLN
288.2000 mg | Freq: Once | INTRAVENOUS | Status: AC
  Administered 2014-09-09: 290 mg via INTRAVENOUS
  Filled 2014-09-09: qty 29

## 2014-09-09 MED ORDER — DIPHENHYDRAMINE HCL 50 MG/ML IJ SOLN
25.0000 mg | Freq: Once | INTRAMUSCULAR | Status: AC
  Administered 2014-09-09: 25 mg via INTRAVENOUS
  Filled 2014-09-09: qty 1

## 2014-09-09 MED ORDER — SODIUM CHLORIDE 0.9 % IJ SOLN
10.0000 mL | INTRAMUSCULAR | Status: DC | PRN
  Administered 2014-09-09: 10 mL
  Filled 2014-09-09: qty 10

## 2014-09-09 MED ORDER — FAMOTIDINE IN NACL 20-0.9 MG/50ML-% IV SOLN
20.0000 mg | Freq: Once | INTRAVENOUS | Status: AC
  Administered 2014-09-09: 20 mg via INTRAVENOUS
  Filled 2014-09-09: qty 50

## 2014-09-09 MED ORDER — SODIUM CHLORIDE 0.9 % IV SOLN
Freq: Once | INTRAVENOUS | Status: AC
  Administered 2014-09-09: 09:00:00 via INTRAVENOUS
  Filled 2014-09-09: qty 250

## 2014-09-09 MED ORDER — HEPARIN SOD (PORK) LOCK FLUSH 100 UNIT/ML IV SOLN
500.0000 [IU] | Freq: Once | INTRAVENOUS | Status: AC | PRN
  Administered 2014-09-09: 500 [IU]
  Filled 2014-09-09: qty 5

## 2014-09-09 MED ORDER — PALONOSETRON HCL INJECTION 0.25 MG/5ML
0.2500 mg | Freq: Once | INTRAVENOUS | Status: AC
  Administered 2014-09-09: 0.25 mg via INTRAVENOUS
  Filled 2014-09-09: qty 5

## 2014-09-09 MED ORDER — SODIUM CHLORIDE 0.9 % IV SOLN
Freq: Once | INTRAVENOUS | Status: AC
  Administered 2014-09-09: 10:00:00 via INTRAVENOUS
  Filled 2014-09-09: qty 5

## 2014-09-09 MED ORDER — SODIUM CHLORIDE 0.9 % IV SOLN: Freq: Once | INTRAVENOUS | Status: DC

## 2014-09-09 MED ORDER — PACLITAXEL CHEMO INJECTION 300 MG/50ML
60.0000 mg/m2 | Freq: Once | INTRAVENOUS | Status: AC
  Administered 2014-09-09: 138 mg via INTRAVENOUS
  Filled 2014-09-09: qty 23

## 2014-09-10 ENCOUNTER — Ambulatory Visit
Admission: RE | Admit: 2014-09-10 | Discharge: 2014-09-10 | Disposition: A | Discharge status: Home / Self Care | Payer: Medicare Other | Source: Ambulatory Visit | Attending: Radiation Oncology | Admitting: Radiation Oncology

## 2014-09-10 DIAGNOSIS — Z510 Encounter for antineoplastic radiation therapy: Secondary | ICD-10-CM | POA: Diagnosis not present

## 2014-09-10 NOTE — Progress Notes (Signed)
[place]  Telephone:(336) [phone] Fax:(336) [phone]     ID: AMED DATTA OB: Jul 18, 1955  MR#: [id number]  CSN#:[id number]  Patient Care Team: [name], MD as PCP - General (Internal Medicine)  CHIEF COMPLAINT/DIAGNOSIS:  Right non-small cell right upper lobe lung cancer (Squamous), at least stage IIIB (possibly stage IV if left lung nodule is metastatic. On EBUS, could not get left lung nodule bx done due to BP falling). 07/30/14 - Bronchoscopy. DIAGNOSIS: LUNG, RIGHT HILUM; EBUS-GUIDED FNA: SQUAMOUS CELL CARCINOMA. 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 PET Scan on 07/17/14 - IMPRESSION: 1. Hypermetabolic central right hilar nodule is concern for primary bronchogenic carcinoma. 2. Consolidative process peripheral to this central nodule is felt to represent postobstructive pneumonitis; however cannot exclude local tumor extension. 3. Hypermetabolic nodule associated with a central right middle lobe bronchus is concern for other foci of carcinoma. 4. Hypermetabolic nodule in the parenchyma of the right middle lobe concern for metastatic disease. 5. Hypermetabolic nodule the left upper lobe either represents a secondary primary or metastatic disease. 6. No evidence of hypermetabolic mediastinal lymph nodes or distant metastasis.    HISTORY OF PRESENT ILLNESS:  Patient returns for continued oncology followup and plan chemo for lung cancer. Clinically states that he is doing about the same. Chronic mild dyspnea on exertion is same along with intermittent cough. Denies hemoptysis, or chest pain. Eating well. No fever or chills. Remains fairly physically active. No new bone pains. No fever or chills. No major pain issues. No new mood disturbances.   REVIEW OF SYSTEMS:   ROS As in HPI above. In addition, no fever, chills or sweats. No new headaches or focal weakness.  No new mood disturbances. No  sore throat, cough, shortness of breath, sputum,  hemoptysis or chest pain. No dizziness or palpitation. No abdominal pain, constipation, diarrhea, dysuria or hematuria. No new skin rash or bleeding symptoms. No new paresthesias in extremities.  Otherwise, a complete review of systems is negative.  PAST MEDICAL HISTORY: Past Medical History  Diagnosis Date  . Schizophrenia   . Diabetes mellitus without complication   . Hypertension   . Hyperlipemia   . Chronic fatigue   . Squamous cell carcinoma of right lung 08/22/2014          Diabetes mellitus  Hypertension  Hyperlipidemia  Hypothyroidism  Chronic fatigue  Colonoscopy >10 years ago, 2-3 small polyps removed per patient report.  PAST SURGICAL HISTORY: Past Surgical History  Procedure Laterality Date  . Colonoscopy    . Peripheral vascular catheterization N/A 08/21/2014    Procedure: Glori Luis Cath Insertion;  Surgeon: Algernon Huxley, MD;  Location: [place] [facility];  Service: Cardiovascular;  Laterality: N/A;    FAMILY HISTORY: remarkable for stomach cancer.   ADVANCED DIRECTIVES:   SOCIAL HISTORY: History  Substance Use Topics  . Smoking status: Current Every Day Smoker -- 0.25 packs/day for 42 years    Types: Cigarettes  . Smokeless tobacco: Not on file  . Alcohol Use: 1.2 oz/week    2 Cans of beer per week    Not on File  Current Outpatient Prescriptions  Medication Sig Dispense Refill  . atorvastatin (LIPITOR) 10 MG tablet Take 10 mg by mouth daily.    . hydrochlorothiazide (HYDRODIURIL) 25 MG tablet Take 25 mg by mouth daily.    Marland Kitchen levothyroxine (SYNTHROID, LEVOTHROID) 88 MCG tablet Take 88 mcg by mouth daily before breakfast.    . lisinopril (PRINIVIL,ZESTRIL) 5 MG tablet Take  5 mg by mouth daily.    Marland Kitchen OLANZapine (ZYPREXA) 20 MG tablet Take 40 mg by mouth at bedtime.    . propranolol (INDERAL) 10 MG tablet Take 10 mg by mouth 3 (three) times daily.    . Vitamin D, Ergocalciferol, (DRISDOL) 50000 UNITS CAPS capsule Take 50,000 Units by mouth every 7 (seven) days.       No current facility-administered medications for this visit.    OBJECTIVE: Filed Vitals:   08/26/14 1010  BP: 129/77  Pulse: 90  Temp: 95.5 F (35.3 C)  Resp: 18     Body mass index is 29.95 kg/(m^2).    ECOG FS:2 - Symptomatic, <50% confined to bed  GENERAL: Alert and oriented and in no acute distress. There is no icterus. HEENT: EOMs intact. Oral exam negative for thrush or lesions. No cervical lymphadenopathy. CVS: S1S2, regular LUNGS: Bilaterally clear to auscultation, no rhonchi. ABDOMEN: Soft, nontender. No hepatosplenomegaly clinically.  NEURO: grossly nonfocal, cranial nerves are intact. Gait unremarkable. EXTREMITIES: No pedal edema.  LAB RESULTS:     Component Value Date/Time   NA 135 09/02/2014 0851   NA 135 07/28/2014 1121   K 4.5 09/02/2014 0851   K 4.3 07/28/2014 1121   CL 100* 09/02/2014 0851   CL 101 07/28/2014 1121   CO2 27 09/02/2014 0851   CO2 25 07/28/2014 1121   GLUCOSE 118* 09/02/2014 0851   GLUCOSE 135* 07/28/2014 1121   BUN 29* 09/02/2014 0851   BUN 19 07/28/2014 1121   CREATININE 0.97 09/09/2014 0836   CREATININE 1.36* 07/28/2014 1121   CALCIUM 9.0 09/02/2014 0851   CALCIUM 9.1 07/28/2014 1121   PROT 6.7 09/02/2014 0851   PROT 7.6 07/23/2014 1042   ALBUMIN 4.0 09/02/2014 0851   ALBUMIN 4.2 07/23/2014 1042   AST 20 09/02/2014 0851   AST 24 07/23/2014 1042   ALT 17 09/02/2014 0851   ALT 24 07/23/2014 1042   ALKPHOS 90 09/02/2014 0851   ALKPHOS 108 07/23/2014 1042   BILITOT 0.7 09/02/2014 0851   GFRNONAA >60 09/09/2014 0836   GFRNONAA 57* 07/28/2014 1121   GFRAA >60 09/09/2014 0836   GFRAA >60 07/28/2014 1121         Chemistry      Component Value Date/Time   NA 135 09/02/2014 0851   NA 135 07/28/2014 1121   K 4.5 09/02/2014 0851   K 4.3 07/28/2014 1121   CL 100* 09/02/2014 0851   CL 101 07/28/2014 1121   CO2 27 09/02/2014 0851   CO2 25 07/28/2014 1121   BUN 29* 09/02/2014 0851   BUN 19 07/28/2014 1121   CREATININE  0.97 09/09/2014 0836   CREATININE 1.36* 07/28/2014 1121      Component Value Date/Time   CALCIUM 9.0 09/02/2014 0851   CALCIUM 9.1 07/28/2014 1121   ALKPHOS 90 09/02/2014 0851   ALKPHOS 108 07/23/2014 1042   AST 20 09/02/2014 0851   AST 24 07/23/2014 1042   ALT 17 09/02/2014 0851   ALT 24 07/23/2014 1042   BILITOT 0.7 09/02/2014 0851      STUDIES: 07/02/14 - CT scan of the chest with contrast - right upper lobe nodule with suggestion of endobronchial component (apical segment), concerning for primary malignancy. Opacities elsewhere in the right upper lobe could represent postobstructive atelectasis, obstructive pneumonia, or endobronchial spread of tumor. Left upper lobe spiculated mass 1.7 cm, favor a second primary, though metastasis also a consideration. No lymphadenopathy by size criteria. No appreciable distant metastasis. PET/CT could  be considered for further staging evaluation. COPD/emphysema.  07/17/14 - PET scan.  IMPRESSION: 1. Hypermetabolic central right hilar nodule is concern for primary bronchogenic carcinoma. 2. Consolidative process peripheral to this central nodule is felt to represent postobstructive pneumonitis; however cannot exclude local tumor extension. 3. Hypermetabolic nodule associated with a central right middle lobe bronchus is concern for other foci of carcinoma. 4. Hypermetabolic nodule in the parenchyma of the right middle lobe concern for metastatic disease. 5. Hypermetabolic nodule the left upper lobe either represents a secondary primary or metastatic disease. 6. No evidence of hypermetabolic mediastinal lymph nodes or distant metastasis.  07/30/14 - Bronchoscopy. DIAGNOSIS: LUNG, RIGHT HILUM; EBUS-GUIDED FNA: SQUAMOUS CELL CARCINOMA. 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   ASSESSMENT / PLAN:   Right non-small cell right upper lobe lung cancer (Squamous), at least stage IIIB (possibly stage IV if left lung nodule is  metastatic. On EBUS, could not get left lung nodule bx done due to BP falling). On 07/30/14,  Bronchoscopy/EBUS. DIAGNOSIS: LUNG, RIGHT HILUM; EBUS-GUIDED FNA: SQUAMOUS CELL CARCINOMA. -  Have reviewed biopsy report and d/w patient, and explained that he has at least stage IIIB non-small cell lung Ca (possibly stage IV if left lung nodule is metastatic), and that it repesents advanced stage and likely incurable disease and treatments offered are with palliative intent only. Have d/w rad-onc Dr.Chrystal who feels pursuing concurrent chemoradiation is a reasonable option given hedoes not have multiple areas of metastatic disease. Have again explained possible response rates and possible side effects of concurrent weekly Taxol/Carboplatin, he is agreeable to this and expressed verbal consent. Plan is to start on dose 1 Taxol 60 mg/m2 IV and Carboplatin AUC of 2 IV today (3 weeks on and 1 week off cycle). Labs and chemo at 1 week and 2 weeks. Lab at 3 weeks and f/u at 4 weeks to plan continued treatment.   In between visits, patient advised to call or come to ER in case of any new symptoms or acute sickness. He is agreeable to this plan.    Leia Alf, MD   09/10/2014 6:02 PM

## 2014-09-11 ENCOUNTER — Ambulatory Visit
Admission: RE | Admit: 2014-09-11 | Discharge: 2014-09-11 | Disposition: A | Discharge status: Home / Self Care | Payer: Medicare Other | Source: Ambulatory Visit | Attending: Radiation Oncology | Admitting: Radiation Oncology

## 2014-09-11 DIAGNOSIS — Z510 Encounter for antineoplastic radiation therapy: Secondary | ICD-10-CM | POA: Diagnosis not present

## 2014-09-12 ENCOUNTER — Ambulatory Visit
Admission: RE | Admit: 2014-09-12 | Discharge: 2014-09-12 | Disposition: A | Discharge status: Home / Self Care | Payer: Medicare Other | Source: Ambulatory Visit | Attending: Radiation Oncology | Admitting: Radiation Oncology

## 2014-09-12 DIAGNOSIS — Z510 Encounter for antineoplastic radiation therapy: Secondary | ICD-10-CM | POA: Diagnosis not present

## 2014-09-16 ENCOUNTER — Other Ambulatory Visit: Payer: Medicare Other

## 2014-09-16 ENCOUNTER — Ambulatory Visit
Admission: RE | Admit: 2014-09-16 | Discharge: 2014-09-16 | Disposition: A | Discharge status: Home / Self Care | Payer: Medicare Other | Source: Ambulatory Visit | Attending: Radiation Oncology | Admitting: Radiation Oncology

## 2014-09-16 DIAGNOSIS — Z510 Encounter for antineoplastic radiation therapy: Secondary | ICD-10-CM | POA: Diagnosis not present

## 2014-09-17 ENCOUNTER — Inpatient Hospital Stay: Payer: Medicare Other | Attending: Radiation Oncology

## 2014-09-17 ENCOUNTER — Ambulatory Visit
Admission: RE | Admit: 2014-09-17 | Discharge: 2014-09-17 | Disposition: A | Discharge status: Home / Self Care | Payer: Medicare Other | Source: Ambulatory Visit | Attending: Radiation Oncology | Admitting: Radiation Oncology

## 2014-09-17 DIAGNOSIS — Z5111 Encounter for antineoplastic chemotherapy: Secondary | ICD-10-CM | POA: Diagnosis present

## 2014-09-17 DIAGNOSIS — Z9181 History of falling: Secondary | ICD-10-CM | POA: Diagnosis not present

## 2014-09-17 DIAGNOSIS — R110 Nausea: Secondary | ICD-10-CM | POA: Insufficient documentation

## 2014-09-17 DIAGNOSIS — E039 Hypothyroidism, unspecified: Secondary | ICD-10-CM | POA: Insufficient documentation

## 2014-09-17 DIAGNOSIS — R634 Abnormal weight loss: Secondary | ICD-10-CM | POA: Diagnosis not present

## 2014-09-17 DIAGNOSIS — I10 Essential (primary) hypertension: Secondary | ICD-10-CM | POA: Insufficient documentation

## 2014-09-17 DIAGNOSIS — R5382 Chronic fatigue, unspecified: Secondary | ICD-10-CM | POA: Insufficient documentation

## 2014-09-17 DIAGNOSIS — D649 Anemia, unspecified: Secondary | ICD-10-CM | POA: Insufficient documentation

## 2014-09-17 DIAGNOSIS — E119 Type 2 diabetes mellitus without complications: Secondary | ICD-10-CM | POA: Insufficient documentation

## 2014-09-17 DIAGNOSIS — R630 Anorexia: Secondary | ICD-10-CM | POA: Diagnosis not present

## 2014-09-17 DIAGNOSIS — E785 Hyperlipidemia, unspecified: Secondary | ICD-10-CM | POA: Insufficient documentation

## 2014-09-17 DIAGNOSIS — F17210 Nicotine dependence, cigarettes, uncomplicated: Secondary | ICD-10-CM | POA: Insufficient documentation

## 2014-09-17 DIAGNOSIS — Z79899 Other long term (current) drug therapy: Secondary | ICD-10-CM | POA: Insufficient documentation

## 2014-09-17 DIAGNOSIS — C3491 Malignant neoplasm of unspecified part of right bronchus or lung: Secondary | ICD-10-CM

## 2014-09-17 DIAGNOSIS — C3411 Malignant neoplasm of upper lobe, right bronchus or lung: Secondary | ICD-10-CM | POA: Diagnosis not present

## 2014-09-17 DIAGNOSIS — F209 Schizophrenia, unspecified: Secondary | ICD-10-CM | POA: Insufficient documentation

## 2014-09-17 DIAGNOSIS — Z510 Encounter for antineoplastic radiation therapy: Secondary | ICD-10-CM | POA: Diagnosis not present

## 2014-09-17 LAB — CBC WITH DIFFERENTIAL/PLATELET
BASOS ABS: 0 10*3/uL (ref 0–0.1)
BASOS PCT: 0 %
EOS ABS: 0 10*3/uL (ref 0–0.7)
EOS PCT: 0 %
HEMATOCRIT: 32.3 % — AB (ref 40.0–52.0)
HEMOGLOBIN: 10.7 g/dL — AB (ref 13.0–18.0)
Lymphocytes Relative: 17 %
Lymphs Abs: 0.9 10*3/uL — ABNORMAL LOW (ref 1.0–3.6)
MCH: 29.8 pg (ref 26.0–34.0)
MCHC: 33.2 g/dL (ref 32.0–36.0)
MCV: 89.9 fL (ref 80.0–100.0)
MONOS PCT: 15 %
Monocytes Absolute: 0.8 10*3/uL (ref 0.2–1.0)
NEUTROS PCT: 68 %
Neutro Abs: 3.4 10*3/uL (ref 1.4–6.5)
PLATELETS: 283 10*3/uL (ref 150–440)
RBC: 3.59 MIL/uL — ABNORMAL LOW (ref 4.40–5.90)
RDW: 13.2 % (ref 11.5–14.5)
WBC: 5 10*3/uL (ref 3.8–10.6)

## 2014-09-18 ENCOUNTER — Ambulatory Visit
Admission: RE | Admit: 2014-09-18 | Discharge: 2014-09-18 | Disposition: A | Discharge status: Home / Self Care | Payer: Medicare Other | Source: Ambulatory Visit | Attending: Radiation Oncology | Admitting: Radiation Oncology

## 2014-09-18 DIAGNOSIS — Z510 Encounter for antineoplastic radiation therapy: Secondary | ICD-10-CM | POA: Diagnosis not present

## 2014-09-19 ENCOUNTER — Ambulatory Visit
Admission: RE | Admit: 2014-09-19 | Discharge: 2014-09-19 | Disposition: A | Discharge status: Home / Self Care | Payer: Medicare Other | Source: Ambulatory Visit | Attending: Radiation Oncology | Admitting: Radiation Oncology

## 2014-09-19 DIAGNOSIS — Z510 Encounter for antineoplastic radiation therapy: Secondary | ICD-10-CM | POA: Diagnosis not present

## 2014-09-22 ENCOUNTER — Ambulatory Visit
Admission: RE | Admit: 2014-09-22 | Discharge: 2014-09-22 | Disposition: A | Discharge status: Home / Self Care | Payer: Medicare Other | Source: Ambulatory Visit | Attending: Radiation Oncology | Admitting: Radiation Oncology

## 2014-09-22 DIAGNOSIS — Z510 Encounter for antineoplastic radiation therapy: Secondary | ICD-10-CM | POA: Diagnosis not present

## 2014-09-23 ENCOUNTER — Encounter (INDEPENDENT_AMBULATORY_CARE_PROVIDER_SITE_OTHER): Payer: Self-pay

## 2014-09-23 ENCOUNTER — Other Ambulatory Visit: Payer: Medicare Other

## 2014-09-23 ENCOUNTER — Ambulatory Visit: Payer: Medicare Other

## 2014-09-23 ENCOUNTER — Inpatient Hospital Stay (HOSPITAL_BASED_OUTPATIENT_CLINIC_OR_DEPARTMENT_OTHER): Payer: Medicare Other | Admitting: Internal Medicine

## 2014-09-23 ENCOUNTER — Ambulatory Visit: Payer: Medicare Other | Admitting: Internal Medicine

## 2014-09-23 ENCOUNTER — Inpatient Hospital Stay: Payer: Medicare Other

## 2014-09-23 ENCOUNTER — Ambulatory Visit
Admission: RE | Admit: 2014-09-23 | Discharge: 2014-09-23 | Disposition: A | Discharge status: Home / Self Care | Payer: Medicare Other | Source: Ambulatory Visit | Attending: Radiation Oncology | Admitting: Radiation Oncology

## 2014-09-23 VITALS — BP 102/72 | HR 80 | Temp 95.5°F | Resp 18 | Ht 72.0 in | Wt 213.8 lb

## 2014-09-23 DIAGNOSIS — C3411 Malignant neoplasm of upper lobe, right bronchus or lung: Secondary | ICD-10-CM

## 2014-09-23 DIAGNOSIS — I10 Essential (primary) hypertension: Secondary | ICD-10-CM

## 2014-09-23 DIAGNOSIS — R5382 Chronic fatigue, unspecified: Secondary | ICD-10-CM

## 2014-09-23 DIAGNOSIS — D649 Anemia, unspecified: Secondary | ICD-10-CM | POA: Diagnosis not present

## 2014-09-23 DIAGNOSIS — C3491 Malignant neoplasm of unspecified part of right bronchus or lung: Secondary | ICD-10-CM

## 2014-09-23 DIAGNOSIS — E039 Hypothyroidism, unspecified: Secondary | ICD-10-CM

## 2014-09-23 DIAGNOSIS — E785 Hyperlipidemia, unspecified: Secondary | ICD-10-CM

## 2014-09-23 DIAGNOSIS — R0609 Other forms of dyspnea: Secondary | ICD-10-CM | POA: Diagnosis not present

## 2014-09-23 DIAGNOSIS — R944 Abnormal results of kidney function studies: Secondary | ICD-10-CM | POA: Diagnosis not present

## 2014-09-23 DIAGNOSIS — F17210 Nicotine dependence, cigarettes, uncomplicated: Secondary | ICD-10-CM

## 2014-09-23 DIAGNOSIS — R05 Cough: Secondary | ICD-10-CM

## 2014-09-23 DIAGNOSIS — E119 Type 2 diabetes mellitus without complications: Secondary | ICD-10-CM

## 2014-09-23 DIAGNOSIS — Z510 Encounter for antineoplastic radiation therapy: Secondary | ICD-10-CM | POA: Diagnosis not present

## 2014-09-23 DIAGNOSIS — Z79899 Other long term (current) drug therapy: Secondary | ICD-10-CM

## 2014-09-23 LAB — CBC WITH DIFFERENTIAL/PLATELET
BASOS ABS: 0 10*3/uL (ref 0–0.1)
BASOS PCT: 0 %
EOS PCT: 0 %
Eosinophils Absolute: 0 10*3/uL (ref 0–0.7)
HEMATOCRIT: 32.7 % — AB (ref 40.0–52.0)
Hemoglobin: 10.9 g/dL — ABNORMAL LOW (ref 13.0–18.0)
LYMPHS ABS: 0.8 10*3/uL — AB (ref 1.0–3.6)
Lymphocytes Relative: 15 %
MCH: 29.8 pg (ref 26.0–34.0)
MCHC: 33.3 g/dL (ref 32.0–36.0)
MCV: 89.7 fL (ref 80.0–100.0)
MONO ABS: 1.1 10*3/uL — AB (ref 0.2–1.0)
Monocytes Relative: 19 %
Neutro Abs: 3.7 10*3/uL (ref 1.4–6.5)
Neutrophils Relative %: 66 %
PLATELETS: 242 10*3/uL (ref 150–440)
RBC: 3.64 MIL/uL — ABNORMAL LOW (ref 4.40–5.90)
RDW: 14 % (ref 11.5–14.5)
WBC: 5.6 10*3/uL (ref 3.8–10.6)

## 2014-09-23 LAB — CREATININE, SERUM
Creatinine, Ser: 0.92 mg/dL (ref 0.61–1.24)
GFR calc non Af Amer: >60 mL/min (ref >60)

## 2014-09-23 MED ORDER — DIPHENHYDRAMINE HCL 50 MG/ML IJ SOLN
INTRAMUSCULAR | Status: AC
  Filled 2014-09-23: qty 1

## 2014-09-23 MED ORDER — DIPHENHYDRAMINE HCL 50 MG/ML IJ SOLN
25.0000 mg | Freq: Once | INTRAMUSCULAR | Status: AC
  Administered 2014-09-23: 25 mg via INTRAVENOUS

## 2014-09-23 MED ORDER — SODIUM CHLORIDE 0.9 % IV SOLN
Freq: Once | INTRAVENOUS | Status: AC
  Administered 2014-09-23: 10:00:00 via INTRAVENOUS
  Filled 2014-09-23: qty 5

## 2014-09-23 MED ORDER — SODIUM CHLORIDE 0.9 % IV SOLN
Freq: Once | INTRAVENOUS | Status: AC
  Administered 2014-09-23: 10:00:00 via INTRAVENOUS
  Filled 2014-09-23: qty 1000

## 2014-09-23 MED ORDER — SODIUM CHLORIDE 0.9 % IV SOLN: Freq: Once | INTRAVENOUS | Status: DC

## 2014-09-23 MED ORDER — HEPARIN SOD (PORK) LOCK FLUSH 100 UNIT/ML IV SOLN
500.0000 [IU] | Freq: Once | INTRAVENOUS | Status: AC
  Administered 2014-09-23: 500 [IU] via INTRAVENOUS

## 2014-09-23 MED ORDER — PACLITAXEL CHEMO INJECTION 300 MG/50ML
60.0000 mg/m2 | Freq: Once | INTRAVENOUS | Status: AC
  Administered 2014-09-23: 138 mg via INTRAVENOUS
  Filled 2014-09-23: qty 23

## 2014-09-23 MED ORDER — PALONOSETRON HCL INJECTION 0.25 MG/5ML
0.2500 mg | Freq: Once | INTRAVENOUS | Status: AC
  Administered 2014-09-23: 0.25 mg via INTRAVENOUS
  Filled 2014-09-23: qty 5

## 2014-09-23 MED ORDER — FAMOTIDINE IN NACL 20-0.9 MG/50ML-% IV SOLN
20.0000 mg | Freq: Once | INTRAVENOUS | Status: AC
  Administered 2014-09-23: 20 mg via INTRAVENOUS
  Filled 2014-09-23: qty 50

## 2014-09-23 MED ORDER — HEPARIN SOD (PORK) LOCK FLUSH 100 UNIT/ML IV SOLN
INTRAVENOUS | Status: AC
  Filled 2014-09-23: qty 5

## 2014-09-23 MED ORDER — CARBOPLATIN CHEMO INJECTION 450 MG/45ML
290.0000 mg | Freq: Once | INTRAVENOUS | Status: AC
  Administered 2014-09-23: 290 mg via INTRAVENOUS
  Filled 2014-09-23: qty 29

## 2014-09-24 ENCOUNTER — Ambulatory Visit
Admission: RE | Admit: 2014-09-24 | Discharge: 2014-09-24 | Disposition: A | Discharge status: Home / Self Care | Payer: Medicare Other | Source: Ambulatory Visit | Attending: Radiation Oncology | Admitting: Radiation Oncology

## 2014-09-24 DIAGNOSIS — Z510 Encounter for antineoplastic radiation therapy: Secondary | ICD-10-CM | POA: Diagnosis not present

## 2014-09-25 ENCOUNTER — Ambulatory Visit
Admission: RE | Admit: 2014-09-25 | Discharge: 2014-09-25 | Disposition: A | Discharge status: Home / Self Care | Payer: Medicare Other | Source: Ambulatory Visit | Attending: Radiation Oncology | Admitting: Radiation Oncology

## 2014-09-25 DIAGNOSIS — Z510 Encounter for antineoplastic radiation therapy: Secondary | ICD-10-CM | POA: Diagnosis not present

## 2014-09-26 ENCOUNTER — Ambulatory Visit
Admission: RE | Admit: 2014-09-26 | Discharge: 2014-09-26 | Disposition: A | Discharge status: Home / Self Care | Payer: Medicare Other | Source: Ambulatory Visit | Attending: Radiation Oncology | Admitting: Radiation Oncology

## 2014-09-26 DIAGNOSIS — Z510 Encounter for antineoplastic radiation therapy: Secondary | ICD-10-CM | POA: Diagnosis not present

## 2014-09-29 ENCOUNTER — Ambulatory Visit
Admission: RE | Admit: 2014-09-29 | Discharge: 2014-09-29 | Disposition: A | Discharge status: Home / Self Care | Payer: Medicare Other | Source: Ambulatory Visit | Attending: Radiation Oncology | Admitting: Radiation Oncology

## 2014-09-29 DIAGNOSIS — Z510 Encounter for antineoplastic radiation therapy: Secondary | ICD-10-CM | POA: Diagnosis not present

## 2014-09-29 NOTE — Progress Notes (Signed)
[place]  Telephone:(336) [phone] Fax:(336) [phone]     ID: Travis Maddox OB: 1956/01/24  MR#: [id number]  CSN#:[id number]  Patient Care Team: [name], MD as PCP - General (Internal Medicine)  CHIEF COMPLAINT/DIAGNOSIS:  Right non-small cell right upper lobe lung cancer (Squamous), at least stage IIIB (possibly stage IV if left lung nodule is metastatic. On EBUS, could not get left lung nodule bx done due to BP falling). 07/30/14 - Bronchoscopy. DIAGNOSIS: LUNG, RIGHT HILUM; EBUS-GUIDED FNA: SQUAMOUS CELL CARCINOMA. 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 PET Scan on 07/17/14 - IMPRESSION: 1. Hypermetabolic central right hilar nodule is concern for primary bronchogenic carcinoma. 2. Consolidative process peripheral to this central nodule is felt to represent postobstructive pneumonitis; however cannot exclude local tumor extension. 3. Hypermetabolic nodule associated with a central right middle lobe bronchus is concern for other foci of carcinoma. 4. Hypermetabolic nodule in the parenchyma of the right middle lobe concern for metastatic disease. 5. Hypermetabolic nodule the left upper lobe either represents a secondary primary or metastatic disease. 6. No evidence of hypermetabolic mediastinal lymph nodes or distant metastasis.    HISTORY OF PRESENT ILLNESS:  Patient returns for continued oncology followup and plan next dose of chemo for lung cancer. States that he is doing about the same. No nusea or vomiting. Chronic mild dyspnea on exertion is same along with intermittent cough. Denies hemoptysis, or chest pain. Eating well. No fever or chills. Remains fairly physically active. No new bone pains. No fever or chills. No new mood disturbances. No major pain issues, 0/10.   REVIEW OF SYSTEMS:   ROS As in HPI above. In addition, no fever, chills or sweats. No new headaches or focal weakness.  No new mood disturbances. No  sore throat, cough,  shortness of breath, sputum, hemoptysis or chest pain. No dizziness or palpitation. No abdominal pain, constipation, diarrhea, dysuria or hematuria. No new skin rash or bleeding symptoms. No new paresthesias in extremities. PS ECOG 1.  PAST MEDICAL HISTORY: Reviewed Past Medical History  Diagnosis Date  . Schizophrenia   . Diabetes mellitus without complication   . Hypertension   . Hyperlipemia   . Chronic fatigue   . Squamous cell carcinoma of right lung 08/22/2014          Diabetes mellitus Hypertension Hyperlipidemia Hypothyroidism Chronic fatigue Colonoscopy >10 years ago, 2-3 small polyps removed per patient report  PAST SURGICAL HISTORY:Reviewed Past Surgical History  Procedure Laterality Date  . Colonoscopy    . Peripheral vascular catheterization N/A 08/21/2014    Procedure: Glori Luis Cath Insertion;  Surgeon: Algernon Huxley, MD;  Location: [place] [facility];  Service: Cardiovascular;  Laterality: N/A;    FAMILY HISTORY:Reviewed Remarkable for stomach cancer.  ADVANCED DIRECTIVES:  No - patient declined information  SOCIAL HISTORY: Reviewed History  Substance Use Topics  . Smoking status: Current Every Day Smoker -- 0.25 packs/day for 42 years    Types: Cigarettes  . Smokeless tobacco: Not on file  . Alcohol Use: 1.2 oz/week    2 Cans of beer per week    No Known Allergies  Current Outpatient Prescriptions  Medication Sig Dispense Refill  . atorvastatin (LIPITOR) 10 MG tablet Take 10 mg by mouth daily.    . hydrochlorothiazide (HYDRODIURIL) 25 MG tablet Take 25 mg by mouth daily.    Marland Kitchen levothyroxine (SYNTHROID, LEVOTHROID) 88 MCG tablet Take 88 mcg by mouth daily before breakfast.    . lisinopril (PRINIVIL,ZESTRIL) 5 MG  tablet Take 5 mg by mouth daily.    Marland Kitchen OLANZapine (ZYPREXA) 20 MG tablet Take 40 mg by mouth at bedtime.    . propranolol (INDERAL) 10 MG tablet Take 10 mg by mouth 3 (three) times daily.    . Vitamin D,  Ergocalciferol, (DRISDOL) 50000 UNITS CAPS capsule Take 50,000 Units by mouth every 7 (seven) days.     No current facility-administered medications for this visit.    OBJECTIVE: Filed Vitals:   09/23/14 0857  BP:   Pulse:   Temp:   Resp: 18     Body mass index is 29 kg/(m^2).    ECOG FS:1 - Symptomatic but completely ambulatory  GENERAL: Patient is alert and oriented and in no acute distress. There is no icterus. HEENT: EOMs intact. Oral exam negative for thrush or lesions. No cervical lymphadenopathy. CVS: S1S2, regular LUNGS: Bilaterally clear to auscultation, no rhonchi. ABDOMEN: Soft, nontender. No hepatosplenomegaly clinically.  NEURO: grossly nonfocal, cranial nerves are intact. Gait unremarkable. EXTREMITIES: No pedal edema.   LAB RESULTS:     Component Value Date/Time   NA 135 09/02/2014 0851   NA 135 07/28/2014 1121   K 4.5 09/02/2014 0851   K 4.3 07/28/2014 1121   CL 100* 09/02/2014 0851   CL 101 07/28/2014 1121   CO2 27 09/02/2014 0851   CO2 25 07/28/2014 1121   GLUCOSE 118* 09/02/2014 0851   GLUCOSE 135* 07/28/2014 1121   BUN 29* 09/02/2014 0851   BUN 19 07/28/2014 1121   CREATININE 0.92 09/23/2014 0822   CREATININE 1.36* 07/28/2014 1121   CALCIUM 9.0 09/02/2014 0851   CALCIUM 9.1 07/28/2014 1121   PROT 6.7 09/02/2014 0851   PROT 7.6 07/23/2014 1042   ALBUMIN 4.0 09/02/2014 0851   ALBUMIN 4.2 07/23/2014 1042   AST 20 09/02/2014 0851   AST 24 07/23/2014 1042   ALT 17 09/02/2014 0851   ALT 24 07/23/2014 1042   ALKPHOS 90 09/02/2014 0851   ALKPHOS 108 07/23/2014 1042   BILITOT 0.7 09/02/2014 0851   GFRNONAA >60 09/23/2014 0822   GFRNONAA 57* 07/28/2014 1121   GFRAA >60 09/23/2014 0822   GFRAA >60 07/28/2014 1121   Lab Results  Component Value Date   WBC 5.6 09/23/2014   NEUTROABS 3.7 09/23/2014   HGB 10.9* 09/23/2014   HCT 32.7* 09/23/2014   MCV 89.7 09/23/2014   PLT 242 09/23/2014     STUDIES: 07/02/14 - CT scan of the chest with  contrast - right upper lobe nodule with suggestion of endobronchial component (apical segment), concerning for primary malignancy. Opacities elsewhere in the right upper lobe could represent postobstructive atelectasis, obstructive pneumonia, or endobronchial spread of tumor. Left upper lobe spiculated mass 1.7 cm, favor a second primary, though metastasis also a consideration. No lymphadenopathy by size criteria. No appreciable distant metastasis. PET/CT could be considered for further staging evaluation. COPD/emphysema.  07/17/14 - PET scan. IMPRESSION: 1. Hypermetabolic central right hilar nodule is concern for primary bronchogenic carcinoma. 2. Consolidative process peripheral to this central nodule is felt to represent postobstructive pneumonitis; however cannot exclude local tumor extension. 3. Hypermetabolic nodule associated with a central right middle lobe bronchus is concern for other foci of carcinoma. 4. Hypermetabolic nodule in the parenchyma of the right middle lobe concern for metastatic disease. 5. Hypermetabolic nodule the left upper lobe either represents a secondary primary or metastatic disease. 6. No evidence of hypermetabolic mediastinal lymph nodes or distant metastasis.  07/30/14 - Bronchoscopy. DIAGNOSIS: LUNG, RIGHT HILUM; EBUS-GUIDED FNA:  SQUAMOUS CELL CARCINOMA. 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   STAGING: Squamous cell carcinoma of right lung   Staging form: Lung, AJCC 7th Edition     Clinical stage from 07/17/2014: Stage IV (T4, N0, M1a) - Signed by Leia Alf, MD on 09/29/2014.   ASSESSMENT / PLAN:    1. Right non-small cell right upper lobe lung cancer (Squamous), at least stage IIIB (possibly stage IV if left lung nodule is metastatic. On EBUS, could not get left lung nodule bx done due to BP falling). On 07/30/14, Bronchoscopy/EBUS. DIAGNOSIS: LUNG, RIGHT HILUM; EBUS-GUIDED FNA: SQUAMOUS CELL CARCINOMA. - Have reviewed labs and  d/w patient. Overall tolerating current chemo regimen well without major side effects. No symptoms of peripheral neuropathy. Blood counts are adequate, will proceed with next dose of chemo with Taxol 60 mg/m2 IV and Carboplatin AUC of 2 IV today (3 weeks on and 1 week off cycle). Labs and chemo at 1 week and 2 weeks. Lab at 3 weeks and f/u at 4 weeks to plan continued treatment.  2. Anemia - likely from chemotherapy. Mild, no new symptoms, will monitor. 3. Renal function test shows serum Cr just above normal range. Have advised to increase oral fluid intake. Will monitor. 4. In between visits, patient advised to call or come to ER in case of any new symptoms or acute sickness. He is agreeable to this plan.   Leia Alf, MD   09/29/2014 6:50 AM

## 2014-09-30 ENCOUNTER — Ambulatory Visit
Admission: RE | Admit: 2014-09-30 | Discharge: 2014-09-30 | Disposition: A | Discharge status: Home / Self Care | Payer: Medicare Other | Source: Ambulatory Visit | Attending: Radiation Oncology | Admitting: Radiation Oncology

## 2014-09-30 ENCOUNTER — Inpatient Hospital Stay: Payer: Medicare Other

## 2014-09-30 ENCOUNTER — Ambulatory Visit: Payer: Medicare Other

## 2014-09-30 ENCOUNTER — Telehealth: Payer: Self-pay | Admitting: Pharmacist

## 2014-09-30 ENCOUNTER — Inpatient Hospital Stay
Admission: RE | Admit: 2014-09-30 | Discharge: 2014-09-30 | Disposition: A | Discharge status: Home / Self Care | Payer: Self-pay | Source: Ambulatory Visit | Attending: Radiation Oncology | Admitting: Radiation Oncology

## 2014-09-30 VITALS — BP 98/55 | HR 90 | Resp 20

## 2014-09-30 DIAGNOSIS — Z510 Encounter for antineoplastic radiation therapy: Secondary | ICD-10-CM | POA: Diagnosis not present

## 2014-09-30 DIAGNOSIS — C3411 Malignant neoplasm of upper lobe, right bronchus or lung: Secondary | ICD-10-CM | POA: Diagnosis not present

## 2014-09-30 DIAGNOSIS — C3491 Malignant neoplasm of unspecified part of right bronchus or lung: Secondary | ICD-10-CM

## 2014-09-30 LAB — CBC WITH DIFFERENTIAL/PLATELET
BASOS ABS: 0 10*3/uL (ref 0–0.1)
Basophils Relative: 0 %
Eosinophils Absolute: 0 10*3/uL (ref 0–0.7)
Eosinophils Relative: 0 %
HCT: 32.5 % — ABNORMAL LOW (ref 40.0–52.0)
HEMOGLOBIN: 10.7 g/dL — AB (ref 13.0–18.0)
LYMPHS ABS: 1.2 10*3/uL (ref 1.0–3.6)
LYMPHS PCT: 11 %
MCH: 29.3 pg (ref 26.0–34.0)
MCHC: 32.8 g/dL (ref 32.0–36.0)
MCV: 89.3 fL (ref 80.0–100.0)
MONO ABS: 0.9 10*3/uL (ref 0.2–1.0)
Monocytes Relative: 8 %
NEUTROS ABS: 8.9 10*3/uL — AB (ref 1.4–6.5)
Neutrophils Relative %: 81 %
Platelets: 274 10*3/uL (ref 150–440)
RBC: 3.64 MIL/uL — AB (ref 4.40–5.90)
RDW: 14.3 % (ref 11.5–14.5)
WBC: 11.1 10*3/uL — ABNORMAL HIGH (ref 3.8–10.6)

## 2014-09-30 LAB — CREATININE, SERUM
Creatinine, Ser: 1.75 mg/dL — ABNORMAL HIGH (ref 0.61–1.24)
GFR, EST AFRICAN AMERICAN: 48 mL/min — AB (ref >60)
GFR, EST NON AFRICAN AMERICAN: 41 mL/min — AB (ref >60)

## 2014-09-30 MED ORDER — PACLITAXEL CHEMO INJECTION 300 MG/50ML
60.0000 mg/m2 | Freq: Once | INTRAVENOUS | Status: AC
  Administered 2014-09-30: 138 mg via INTRAVENOUS
  Filled 2014-09-30: qty 23

## 2014-09-30 MED ORDER — SODIUM CHLORIDE 0.9 % IV SOLN
Freq: Once | INTRAVENOUS | Status: AC
  Administered 2014-09-30: 11:00:00 via INTRAVENOUS
  Filled 2014-09-30: qty 1000

## 2014-09-30 MED ORDER — SODIUM CHLORIDE 0.9 % IV SOLN
182.0000 mg | Freq: Once | INTRAVENOUS | Status: AC
  Administered 2014-09-30: 180 mg via INTRAVENOUS
  Filled 2014-09-30: qty 18

## 2014-09-30 MED ORDER — SODIUM CHLORIDE 0.9 % IV SOLN
Freq: Once | INTRAVENOUS | Status: AC
  Administered 2014-09-30: 11:00:00 via INTRAVENOUS
  Filled 2014-09-30: qty 8

## 2014-09-30 MED ORDER — HEPARIN SOD (PORK) LOCK FLUSH 100 UNIT/ML IV SOLN
500.0000 [IU] | Freq: Once | INTRAVENOUS | Status: DC | PRN
  Filled 2014-09-30: qty 5

## 2014-09-30 MED ORDER — FAMOTIDINE IN NACL 20-0.9 MG/50ML-% IV SOLN
20.0000 mg | Freq: Once | INTRAVENOUS | Status: AC
  Administered 2014-09-30: 20 mg via INTRAVENOUS
  Filled 2014-09-30: qty 50

## 2014-09-30 MED ORDER — DIPHENHYDRAMINE HCL 50 MG/ML IJ SOLN
25.0000 mg | Freq: Once | INTRAMUSCULAR | Status: AC
  Administered 2014-09-30: 25 mg via INTRAVENOUS
  Filled 2014-09-30: qty 1

## 2014-09-30 NOTE — Telephone Encounter (Signed)
Scr increased to 1.7. Carbo dosed decreased and approved by Dr. Grayland Ormond

## 2014-10-01 ENCOUNTER — Other Ambulatory Visit: Payer: Self-pay | Admitting: *Deleted

## 2014-10-01 ENCOUNTER — Ambulatory Visit
Admission: RE | Admit: 2014-10-01 | Discharge: 2014-10-01 | Disposition: A | Discharge status: Home / Self Care | Payer: Medicare Other | Source: Ambulatory Visit | Attending: Radiation Oncology | Admitting: Radiation Oncology

## 2014-10-01 ENCOUNTER — Telehealth: Payer: Self-pay | Admitting: *Deleted

## 2014-10-01 DIAGNOSIS — Z510 Encounter for antineoplastic radiation therapy: Secondary | ICD-10-CM | POA: Diagnosis not present

## 2014-10-01 NOTE — Telephone Encounter (Signed)
Ana sent me a message that pt was nauseated and did not have nausea med.  i check with Dr. Grayland Ormond covering for Dr. Ma Hillock while on vacation and told me I could call in compazine 10 mg po take 1 tablet every 6 hours as needed for nausea #60 and 1 refill.  Called in the drug to [facility].  Called and spoke to curtis at [facility] and he says it is ok to call in med to [facility] but he needs a paper written stating he can have medication in [facility].  His fax 3 is [phone] and i got finnegan to write order and faxed it to the # given

## 2014-10-02 ENCOUNTER — Ambulatory Visit
Admission: RE | Admit: 2014-10-02 | Discharge: 2014-10-02 | Disposition: A | Discharge status: Home / Self Care | Payer: Medicare Other | Source: Ambulatory Visit | Attending: Radiation Oncology | Admitting: Radiation Oncology

## 2014-10-02 DIAGNOSIS — Z510 Encounter for antineoplastic radiation therapy: Secondary | ICD-10-CM | POA: Diagnosis not present

## 2014-10-03 ENCOUNTER — Ambulatory Visit
Admission: RE | Admit: 2014-10-03 | Discharge: 2014-10-03 | Disposition: A | Discharge status: Home / Self Care | Payer: Medicare Other | Source: Ambulatory Visit | Attending: Radiation Oncology | Admitting: Radiation Oncology

## 2014-10-03 DIAGNOSIS — Z510 Encounter for antineoplastic radiation therapy: Secondary | ICD-10-CM | POA: Diagnosis not present

## 2014-10-06 ENCOUNTER — Other Ambulatory Visit: Payer: Self-pay | Admitting: Internal Medicine

## 2014-10-06 ENCOUNTER — Ambulatory Visit: Admission: RE | Admit: 2014-10-06 | Payer: Medicare Other | Source: Ambulatory Visit

## 2014-10-06 DIAGNOSIS — C3491 Malignant neoplasm of unspecified part of right bronchus or lung: Secondary | ICD-10-CM

## 2014-10-07 ENCOUNTER — Inpatient Hospital Stay: Payer: Medicare Other

## 2014-10-07 ENCOUNTER — Inpatient Hospital Stay
Admission: RE | Admit: 2014-10-07 | Discharge: 2014-10-07 | Disposition: A | Discharge status: Home / Self Care | Payer: Self-pay | Source: Ambulatory Visit | Attending: Radiation Oncology | Admitting: Radiation Oncology

## 2014-10-07 ENCOUNTER — Ambulatory Visit
Admission: RE | Admit: 2014-10-07 | Discharge: 2014-10-07 | Disposition: A | Discharge status: Home / Self Care | Payer: Medicare Other | Source: Ambulatory Visit | Attending: Radiation Oncology | Admitting: Radiation Oncology

## 2014-10-07 ENCOUNTER — Inpatient Hospital Stay (HOSPITAL_BASED_OUTPATIENT_CLINIC_OR_DEPARTMENT_OTHER): Payer: Medicare Other | Admitting: Internal Medicine

## 2014-10-07 VITALS — BP 105/72 | HR 99 | Temp 96.3°F | Resp 18 | Ht 72.0 in | Wt 213.0 lb

## 2014-10-07 DIAGNOSIS — R630 Anorexia: Secondary | ICD-10-CM

## 2014-10-07 DIAGNOSIS — E785 Hyperlipidemia, unspecified: Secondary | ICD-10-CM

## 2014-10-07 DIAGNOSIS — C3491 Malignant neoplasm of unspecified part of right bronchus or lung: Secondary | ICD-10-CM

## 2014-10-07 DIAGNOSIS — D649 Anemia, unspecified: Secondary | ICD-10-CM

## 2014-10-07 DIAGNOSIS — C3411 Malignant neoplasm of upper lobe, right bronchus or lung: Secondary | ICD-10-CM

## 2014-10-07 DIAGNOSIS — R110 Nausea: Secondary | ICD-10-CM

## 2014-10-07 DIAGNOSIS — E039 Hypothyroidism, unspecified: Secondary | ICD-10-CM

## 2014-10-07 DIAGNOSIS — R5382 Chronic fatigue, unspecified: Secondary | ICD-10-CM

## 2014-10-07 DIAGNOSIS — Z510 Encounter for antineoplastic radiation therapy: Secondary | ICD-10-CM | POA: Diagnosis not present

## 2014-10-07 DIAGNOSIS — F17210 Nicotine dependence, cigarettes, uncomplicated: Secondary | ICD-10-CM

## 2014-10-07 DIAGNOSIS — R634 Abnormal weight loss: Secondary | ICD-10-CM

## 2014-10-07 DIAGNOSIS — Z79899 Other long term (current) drug therapy: Secondary | ICD-10-CM

## 2014-10-07 DIAGNOSIS — E119 Type 2 diabetes mellitus without complications: Secondary | ICD-10-CM

## 2014-10-07 DIAGNOSIS — I10 Essential (primary) hypertension: Secondary | ICD-10-CM

## 2014-10-07 LAB — BASIC METABOLIC PANEL
Anion gap: 7 (ref 5–15)
BUN: 20 mg/dL (ref 6–20)
CALCIUM: 8 mg/dL — AB (ref 8.9–10.3)
CO2: 22 mmol/L (ref 22–32)
CREATININE: 0.94 mg/dL (ref 0.61–1.24)
Chloride: 98 mmol/L — ABNORMAL LOW (ref 101–111)
GFR calc Af Amer: >60 mL/min (ref >60)
GFR calc non Af Amer: >60 mL/min (ref >60)
GLUCOSE: 121 mg/dL — AB (ref 65–99)
Potassium: 4.5 mmol/L (ref 3.5–5.1)
Sodium: 127 mmol/L — ABNORMAL LOW (ref 135–145)

## 2014-10-07 LAB — HEPATIC FUNCTION PANEL
ALBUMIN: 3.2 g/dL — AB (ref 3.5–5.0)
ALT: 22 U/L (ref 17–63)
AST: 24 U/L (ref 15–41)
Alkaline Phosphatase: 92 U/L (ref 38–126)
Bilirubin, Direct: <0.1 mg/dL — ABNORMAL LOW (ref 0.1–0.5)
TOTAL PROTEIN: 6 g/dL — AB (ref 6.5–8.1)
Total Bilirubin: 0.4 mg/dL (ref 0.3–1.2)

## 2014-10-07 LAB — CBC WITH DIFFERENTIAL/PLATELET
Basophils Absolute: 0 10*3/uL (ref 0–0.1)
Basophils Relative: 0 %
EOS ABS: 0 10*3/uL (ref 0–0.7)
Eosinophils Relative: 0 %
HCT: 28.4 % — ABNORMAL LOW (ref 40.0–52.0)
Hemoglobin: 9.5 g/dL — ABNORMAL LOW (ref 13.0–18.0)
LYMPHS ABS: 0.4 10*3/uL — AB (ref 1.0–3.6)
Lymphocytes Relative: 6 %
MCH: 30.3 pg (ref 26.0–34.0)
MCHC: 33.6 g/dL (ref 32.0–36.0)
MCV: 90.1 fL (ref 80.0–100.0)
Monocytes Absolute: 0.2 10*3/uL (ref 0.2–1.0)
Monocytes Relative: 4 %
NEUTROS ABS: 5.5 10*3/uL (ref 1.4–6.5)
Neutrophils Relative %: 90 %
PLATELETS: 261 10*3/uL (ref 150–440)
RBC: 3.15 MIL/uL — ABNORMAL LOW (ref 4.40–5.90)
RDW: 15.6 % — ABNORMAL HIGH (ref 11.5–14.5)
WBC: 6.1 10*3/uL (ref 3.8–10.6)

## 2014-10-07 MED ORDER — SODIUM CHLORIDE 0.9 % IV SOLN
Freq: Once | INTRAVENOUS | Status: AC
  Administered 2014-10-07: 12:00:00 via INTRAVENOUS
  Filled 2014-10-07: qty 8

## 2014-10-07 MED ORDER — DIPHENHYDRAMINE HCL 50 MG/ML IJ SOLN
25.0000 mg | Freq: Once | INTRAMUSCULAR | Status: AC
  Administered 2014-10-07: 25 mg via INTRAVENOUS
  Filled 2014-10-07: qty 1

## 2014-10-07 MED ORDER — PACLITAXEL CHEMO INJECTION 300 MG/50ML
60.0000 mg/m2 | Freq: Once | INTRAVENOUS | Status: AC
  Administered 2014-10-07: 138 mg via INTRAVENOUS
  Filled 2014-10-07: qty 23

## 2014-10-07 MED ORDER — CARBOPLATIN CHEMO INJECTION 450 MG/45ML
290.0000 mg | Freq: Once | INTRAVENOUS | Status: AC
  Administered 2014-10-07: 290 mg via INTRAVENOUS
  Filled 2014-10-07: qty 29

## 2014-10-07 MED ORDER — SODIUM CHLORIDE 0.9 % IV SOLN
Freq: Once | INTRAVENOUS | Status: AC
  Administered 2014-10-07: 12:00:00 via INTRAVENOUS
  Filled 2014-10-07: qty 1000

## 2014-10-07 MED ORDER — HEPARIN SOD (PORK) LOCK FLUSH 100 UNIT/ML IV SOLN
500.0000 [IU] | Freq: Once | INTRAVENOUS | Status: AC | PRN
  Administered 2014-10-07: 500 [IU]

## 2014-10-07 MED ORDER — SODIUM CHLORIDE 0.9 % IJ SOLN
10.0000 mL | INTRAMUSCULAR | Status: DC | PRN
  Filled 2014-10-07: qty 10

## 2014-10-07 MED ORDER — FAMOTIDINE IN NACL 20-0.9 MG/50ML-% IV SOLN
20.0000 mg | Freq: Once | INTRAVENOUS | Status: AC
  Administered 2014-10-07: 20 mg via INTRAVENOUS
  Filled 2014-10-07: qty 50

## 2014-10-07 NOTE — Progress Notes (Signed)
Patient's caregiver states that patient felt down after his treatment last week. She states that he lost some weight because he did not have much of an appetite and had some nausea.

## 2014-10-08 ENCOUNTER — Ambulatory Visit
Admission: RE | Admit: 2014-10-08 | Discharge: 2014-10-08 | Disposition: A | Discharge status: Home / Self Care | Payer: Medicare Other | Source: Ambulatory Visit | Attending: Radiation Oncology | Admitting: Radiation Oncology

## 2014-10-08 DIAGNOSIS — Z510 Encounter for antineoplastic radiation therapy: Secondary | ICD-10-CM | POA: Diagnosis not present

## 2014-10-09 ENCOUNTER — Ambulatory Visit: Payer: Medicare Other

## 2014-10-10 ENCOUNTER — Ambulatory Visit: Payer: Medicare Other

## 2014-10-13 ENCOUNTER — Ambulatory Visit
Admission: RE | Admit: 2014-10-13 | Discharge: 2014-10-13 | Disposition: A | Discharge status: Home / Self Care | Payer: Medicare Other | Source: Ambulatory Visit | Attending: Radiation Oncology | Admitting: Radiation Oncology

## 2014-10-13 DIAGNOSIS — Z510 Encounter for antineoplastic radiation therapy: Secondary | ICD-10-CM | POA: Diagnosis not present

## 2014-10-13 NOTE — Progress Notes (Signed)
[place]  Telephone:(336) [phone] Fax:(336) [phone]     ID: Travis Maddox OB: 1955/09/09  MR#: [id number]  CSN#:[id number]  Patient Care Team: [name], MD as PCP - General (Internal Medicine)  CHIEF COMPLAINT/DIAGNOSIS:  Right non-small cell right upper lobe lung cancer (Squamous), at least stage IIIB (possibly stage IV if left lung nodule is metastatic. On EBUS, could not get left lung nodule bx done due to BP falling). 07/30/14 - Bronchoscopy. DIAGNOSIS: LUNG, RIGHT HILUM; EBUS-GUIDED FNA: SQUAMOUS CELL CARCINOMA. 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 PET Scan on 07/17/14 - IMPRESSION: 1. Hypermetabolic central right hilar nodule is concern for primary bronchogenic carcinoma. 2. Consolidative process peripheral to this central nodule is felt to represent postobstructive pneumonitis; however cannot exclude local tumor extension. 3. Hypermetabolic nodule associated with a central right middle lobe bronchus is concern for other foci of carcinoma. 4. Hypermetabolic nodule in the parenchyma of the right middle lobe concern for metastatic disease. 5. Hypermetabolic nodule the left upper lobe either represents a secondary primary or metastatic disease. 6. No evidence of hypermetabolic mediastinal lymph nodes or distant metastasis.  On chemoradiation.    HISTORY OF PRESENT ILLNESS:  Patient returns for continued oncology followup and plan next dose of chemotherapy with taxol/carboplatin for lung cancer. Denies new complaints, states that he is doing about the same. No nusea or vomiting. Chronic mild dyspnea on exertion is same along with intermittent cough. Denies hemoptysis, or chest pain. Eating well. No fever or chills. Remains fairly physically active. No new bone pains. No fever or chills. No new mood disturbances. No major pain issues, 0/10.   REVIEW OF SYSTEMS:   ROS  As in HPI above. In addition, no fevers or sweats. No new headaches  or focal weakness.  No new sore throat, cough, shortness of breath, sputum, hemoptysis or chest pain. No dizziness or palpitation. No abdominal pain, constipation, diarrhea, dysuria or hematuria. No new skin rash or bleeding symptoms. No new paresthesias in extremities. No polyuria polydipsia. PS ECOG 1.  PAST MEDICAL HISTORY: Reviewed Past Medical History  Diagnosis Date  . Schizophrenia   . Diabetes mellitus without complication   . Hypertension   . Hyperlipemia   . Chronic fatigue   . Squamous cell carcinoma of right lung 08/22/2014          Diabetes mellitus Hypertension Hyperlipidemia Hypothyroidism Chronic fatigue Colonoscopy >10 years ago, 2-3 small polyps removed per patient report  PAST SURGICAL HISTORY:Reviewed Past Surgical History  Procedure Laterality Date  . Colonoscopy    . Peripheral vascular catheterization N/A 08/21/2014    Procedure: Glori Luis Cath Insertion;  Surgeon: Algernon Huxley, MD;  Location: [facility];  Service: Cardiovascular;  Laterality: N/A;    FAMILY HISTORY:Reviewed Remarkable for stomach cancer.  ADVANCED DIRECTIVES:  No - patient declined information  SOCIAL HISTORY: Reviewed History  Substance Use Topics  . Smoking status: Current Every Day Smoker -- 0.25 packs/day for 42 years    Types: Cigarettes  . Smokeless tobacco: Not on file  . Alcohol Use: 1.2 oz/week    2 Cans of beer per week    No Known Allergies  Current Outpatient Prescriptions  Medication Sig Dispense Refill  . atorvastatin (LIPITOR) 10 MG tablet Take 10 mg by mouth daily.    . hydrochlorothiazide (HYDRODIURIL) 25 MG tablet Take 25 mg by mouth daily.    Marland Kitchen levothyroxine (SYNTHROID, LEVOTHROID) 88 MCG tablet Take 88 mcg by mouth daily before breakfast.    .  lisinopril (PRINIVIL,ZESTRIL) 5 MG tablet Take 5 mg by mouth daily.    Marland Kitchen OLANZapine (ZYPREXA) 20 MG tablet Take 40 mg by mouth at bedtime.    . prochlorperazine  (COMPAZINE) 10 MG tablet Take 10 mg by mouth every 6 (six) hours as needed for nausea or vomiting.    . propranolol (INDERAL) 10 MG tablet Take 10 mg by mouth 3 (three) times daily.    . Vitamin D, Ergocalciferol, (DRISDOL) 50000 UNITS CAPS capsule Take 50,000 Units by mouth every 7 (seven) days.     No current facility-administered medications for this visit.    PHYSICAL EXAM: Filed Vitals:   10/07/14 1103  BP: 105/72  Pulse: 99  Temp: 96.3 F (35.7 C)  Resp: 18     Body mass index is 28.88 kg/(m^2).    ECOG FS:1 - Symptomatic but completely ambulatory  GENERAL: Alert and oriented and in no acute distress. No icterus. HEENT: EOMs intact. Oral exam negative for thrush or lesions. No cervical lymphadenopathy. CVS: S1S2, regular LUNGS: Bilaterally clear to auscultation, no creps or rhonchi. ABDOMEN: Soft, nontender. No hepatomegaly clinically.  NEURO: grossly nonfocal, cranial nerves are intact. Gait unremarkable. EXTREMITIES: No pedal edema.   LAB RESULTS:     Component Value Date/Time   NA 127* 10/07/2014 0925   NA 135 07/28/2014 1121   K 4.5 10/07/2014 0925   K 4.3 07/28/2014 1121   CL 98* 10/07/2014 0925   CL 101 07/28/2014 1121   CO2 22 10/07/2014 0925   CO2 25 07/28/2014 1121   GLUCOSE 121* 10/07/2014 0925   GLUCOSE 135* 07/28/2014 1121   BUN 20 10/07/2014 0925   BUN 19 07/28/2014 1121   CREATININE 0.94 10/07/2014 0925   CREATININE 1.36* 07/28/2014 1121   CALCIUM 8.0* 10/07/2014 0925   CALCIUM 9.1 07/28/2014 1121   PROT 6.0* 10/07/2014 0925   PROT 7.6 07/23/2014 1042   ALBUMIN 3.2* 10/07/2014 0925   ALBUMIN 4.2 07/23/2014 1042   AST 24 10/07/2014 0925   AST 24 07/23/2014 1042   ALT 22 10/07/2014 0925   ALT 24 07/23/2014 1042   ALKPHOS 92 10/07/2014 0925   ALKPHOS 108 07/23/2014 1042   BILITOT 0.4 10/07/2014 0925   GFRNONAA >60 10/07/2014 0925   GFRNONAA 57* 07/28/2014 1121   GFRAA >60 10/07/2014 0925   GFRAA >60 07/28/2014 1121   Lab Results    Component Value Date   WBC 6.1 10/07/2014   NEUTROABS 5.5 10/07/2014   HGB 9.5* 10/07/2014   HCT 28.4* 10/07/2014   MCV 90.1 10/07/2014   PLT 261 10/07/2014     STUDIES: 07/02/14 - CT scan of the chest with contrast - right upper lobe nodule with suggestion of endobronchial component (apical segment), concerning for primary malignancy. Opacities elsewhere in the right upper lobe could represent postobstructive atelectasis, obstructive pneumonia, or endobronchial spread of tumor. Left upper lobe spiculated mass 1.7 cm, favor a second primary, though metastasis also a consideration. No lymphadenopathy by size criteria. No appreciable distant metastasis. PET/CT could be considered for further staging evaluation. COPD/emphysema.  07/17/14 - PET scan. IMPRESSION: 1. Hypermetabolic central right hilar nodule is concern for primary bronchogenic carcinoma. 2. Consolidative process peripheral to this central nodule is felt to represent postobstructive pneumonitis; however cannot exclude local tumor extension. 3. Hypermetabolic nodule associated with a central right middle lobe bronchus is concern for other foci of carcinoma. 4. Hypermetabolic nodule in the parenchyma of the right middle lobe concern for metastatic disease. 5. Hypermetabolic nodule the left  upper lobe either represents a secondary primary or metastatic disease. 6. No evidence of hypermetabolic mediastinal lymph nodes or distant metastasis.  07/30/14 - Bronchoscopy. DIAGNOSIS: LUNG, RIGHT HILUM; EBUS-GUIDED FNA: SQUAMOUS CELL CARCINOMA. 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   STAGING: Squamous cell carcinoma of right lung   Staging form: Lung, AJCC 7th Edition     Clinical stage from 07/17/2014: Stage IV (T4, N0, M1a) - Signed by Leia Alf, MD on 09/29/2014.   ASSESSMENT / PLAN:    1. Right non-small cell right upper lobe lung cancer (Squamous), at least stage IIIB (possibly stage IV if left lung  nodule is metastatic. On EBUS, could not get left lung nodule bx done due to BP falling). On 07/30/14, Bronchoscopy/EBUS. DIAGNOSIS: LUNG, RIGHT HILUM; EBUS-GUIDED FNA: SQUAMOUS CELL CARCINOMA. - reviewed labs and d/w patient. He is doing steady, no major side effects form ongoing chemotherapy regimen. No symptoms of peripheral neuropathy. Blood counts are adequate, will proceed with next dose of chemo with Taxol 60 mg/m2 IV and Carboplatin AUC of 2 IV today (3 weeks on and 1 week off cycle). Labs at 1 week. Lab and chemo at 2 weeks. Next MD f/u at 3 weeks and plan continued treatment.  2. Anemia - likely from chemotherapy. Mild, no new symptoms, will monitor. 3. Renal function test shows serum Cr just above normal range. Have advised to increase oral fluid intake. Will monitor. 4. In between visits, patient advised to call or come to ER in case of any new symptoms or acute sickness. He is agreeable to this plan.   Leia Alf, MD   10/13/2014 8:36 AM

## 2014-10-14 ENCOUNTER — Inpatient Hospital Stay: Payer: Medicare Other

## 2014-10-14 ENCOUNTER — Ambulatory Visit: Payer: Medicare Other

## 2014-10-14 ENCOUNTER — Other Ambulatory Visit: Payer: Medicare Other

## 2014-10-14 ENCOUNTER — Ambulatory Visit
Admission: RE | Admit: 2014-10-14 | Discharge: 2014-10-14 | Disposition: A | Discharge status: Home / Self Care | Payer: Medicare Other | Source: Ambulatory Visit | Attending: Radiation Oncology | Admitting: Radiation Oncology

## 2014-10-14 DIAGNOSIS — C3491 Malignant neoplasm of unspecified part of right bronchus or lung: Secondary | ICD-10-CM

## 2014-10-14 DIAGNOSIS — Z510 Encounter for antineoplastic radiation therapy: Secondary | ICD-10-CM | POA: Diagnosis not present

## 2014-10-14 DIAGNOSIS — C3411 Malignant neoplasm of upper lobe, right bronchus or lung: Secondary | ICD-10-CM | POA: Diagnosis not present

## 2014-10-14 LAB — CBC WITH DIFFERENTIAL/PLATELET
Basophils Absolute: 0 10*3/uL (ref 0–0.1)
Basophils Relative: 0 %
EOS ABS: 0 10*3/uL (ref 0–0.7)
Eosinophils Relative: 0 %
HEMATOCRIT: 27.4 % — AB (ref 40.0–52.0)
Hemoglobin: 9.2 g/dL — ABNORMAL LOW (ref 13.0–18.0)
LYMPHS ABS: 0.6 10*3/uL — AB (ref 1.0–3.6)
LYMPHS PCT: 14 %
MCH: 30.7 pg (ref 26.0–34.0)
MCHC: 33.7 g/dL (ref 32.0–36.0)
MCV: 90.9 fL (ref 80.0–100.0)
MONOS PCT: 12 %
Monocytes Absolute: 0.5 10*3/uL (ref 0.2–1.0)
NEUTROS PCT: 74 %
Neutro Abs: 3.3 10*3/uL (ref 1.4–6.5)
Platelets: 233 10*3/uL (ref 150–440)
RBC: 3.01 MIL/uL — AB (ref 4.40–5.90)
RDW: 17 % — ABNORMAL HIGH (ref 11.5–14.5)
WBC: 4.5 10*3/uL (ref 3.8–10.6)

## 2014-10-14 LAB — CREATININE, SERUM
Creatinine, Ser: 1.31 mg/dL — ABNORMAL HIGH (ref 0.61–1.24)
GFR calc Af Amer: >60 mL/min (ref >60)
GFR, EST NON AFRICAN AMERICAN: 59 mL/min — AB (ref >60)

## 2014-10-15 ENCOUNTER — Emergency Department
Admission: EM | Admit: 2014-10-15 | Discharge: 2014-10-15 | Disposition: A | Discharge status: Home / Self Care | Payer: Medicare Other | Attending: Emergency Medicine | Admitting: Emergency Medicine

## 2014-10-15 ENCOUNTER — Encounter: Payer: Self-pay | Admitting: Emergency Medicine

## 2014-10-15 ENCOUNTER — Emergency Department: Payer: Medicare Other

## 2014-10-15 ENCOUNTER — Other Ambulatory Visit: Payer: Self-pay

## 2014-10-15 ENCOUNTER — Ambulatory Visit
Admission: RE | Admit: 2014-10-15 | Discharge: 2014-10-15 | Disposition: A | Discharge status: Home / Self Care | Payer: Medicare Other | Source: Ambulatory Visit | Attending: Radiation Oncology | Admitting: Radiation Oncology

## 2014-10-15 ENCOUNTER — Ambulatory Visit: Admission: RE | Admit: 2014-10-15 | Payer: Medicare Other | Source: Ambulatory Visit

## 2014-10-15 ENCOUNTER — Ambulatory Visit: Payer: Medicare Other

## 2014-10-15 ENCOUNTER — Other Ambulatory Visit: Payer: Medicare Other

## 2014-10-15 ENCOUNTER — Encounter: Payer: Self-pay | Admitting: *Deleted

## 2014-10-15 DIAGNOSIS — I10 Essential (primary) hypertension: Secondary | ICD-10-CM | POA: Insufficient documentation

## 2014-10-15 DIAGNOSIS — Z720 Tobacco use: Secondary | ICD-10-CM | POA: Diagnosis not present

## 2014-10-15 DIAGNOSIS — E860 Dehydration: Secondary | ICD-10-CM | POA: Diagnosis not present

## 2014-10-15 DIAGNOSIS — R112 Nausea with vomiting, unspecified: Secondary | ICD-10-CM | POA: Diagnosis not present

## 2014-10-15 DIAGNOSIS — E119 Type 2 diabetes mellitus without complications: Secondary | ICD-10-CM | POA: Diagnosis not present

## 2014-10-15 DIAGNOSIS — Z79899 Other long term (current) drug therapy: Secondary | ICD-10-CM | POA: Diagnosis not present

## 2014-10-15 DIAGNOSIS — R1084 Generalized abdominal pain: Secondary | ICD-10-CM | POA: Insufficient documentation

## 2014-10-15 DIAGNOSIS — C3411 Malignant neoplasm of upper lobe, right bronchus or lung: Secondary | ICD-10-CM | POA: Diagnosis not present

## 2014-10-15 LAB — COMPREHENSIVE METABOLIC PANEL
ALBUMIN: 3.2 g/dL — AB (ref 3.5–5.0)
ALK PHOS: 78 U/L (ref 38–126)
ALT: 28 U/L (ref 17–63)
AST: 29 U/L (ref 15–41)
Anion gap: 14 (ref 5–15)
BUN: 23 mg/dL — ABNORMAL HIGH (ref 6–20)
CALCIUM: 9 mg/dL (ref 8.9–10.3)
CO2: 25 mmol/L (ref 22–32)
Chloride: 92 mmol/L — ABNORMAL LOW (ref 101–111)
Creatinine, Ser: 1.91 mg/dL — ABNORMAL HIGH (ref 0.61–1.24)
GFR calc Af Amer: 43 mL/min — ABNORMAL LOW (ref >60)
GFR calc non Af Amer: 37 mL/min — ABNORMAL LOW (ref >60)
Glucose, Bld: 127 mg/dL — ABNORMAL HIGH (ref 65–99)
POTASSIUM: 4.3 mmol/L (ref 3.5–5.1)
SODIUM: 131 mmol/L — AB (ref 135–145)
TOTAL PROTEIN: 6.1 g/dL — AB (ref 6.5–8.1)
Total Bilirubin: 0.6 mg/dL (ref 0.3–1.2)

## 2014-10-15 LAB — CBC WITH DIFFERENTIAL/PLATELET
BASOS ABS: 0 10*3/uL (ref 0–0.1)
BASOS PCT: 0 %
EOS ABS: 0 10*3/uL (ref 0–0.7)
Eosinophils Relative: 0 %
HCT: 25.1 % — ABNORMAL LOW (ref 40.0–52.0)
HEMOGLOBIN: 8.4 g/dL — AB (ref 13.0–18.0)
Lymphocytes Relative: 8 %
Lymphs Abs: 0.4 10*3/uL — ABNORMAL LOW (ref 1.0–3.6)
MCH: 30.4 pg (ref 26.0–34.0)
MCHC: 33.4 g/dL (ref 32.0–36.0)
MCV: 91.2 fL (ref 80.0–100.0)
Monocytes Absolute: 0.5 10*3/uL (ref 0.2–1.0)
Monocytes Relative: 10 %
Neutro Abs: 4.1 10*3/uL (ref 1.4–6.5)
Neutrophils Relative %: 82 %
Platelets: 205 10*3/uL (ref 150–440)
RBC: 2.75 MIL/uL — ABNORMAL LOW (ref 4.40–5.90)
RDW: 17 % — AB (ref 11.5–14.5)
WBC: 4.9 10*3/uL (ref 3.8–10.6)

## 2014-10-15 LAB — LIPASE, BLOOD: Lipase: 33 U/L (ref 22–51)

## 2014-10-15 LAB — TROPONIN I: Troponin I: <0.03 ng/mL (ref <0.031)

## 2014-10-15 MED ORDER — SODIUM CHLORIDE 0.9 % IV BOLUS (SEPSIS)
2000.0000 mL | Freq: Once | INTRAVENOUS | Status: AC
  Administered 2014-10-15: 2000 mL via INTRAVENOUS

## 2014-10-15 MED ORDER — ONDANSETRON HCL 4 MG PO TABS: 4.0000 mg | ORAL_TABLET | Freq: Four times a day (QID) | ORAL | Status: DC | PRN

## 2014-10-15 MED ORDER — IOHEXOL 240 MG/ML SOLN
25.0000 mL | INTRAMUSCULAR | Status: AC
  Administered 2014-10-15: 25 mL via INTRAVENOUS
  Administered 2014-10-15: 50 mL via INTRAVENOUS

## 2014-10-15 NOTE — Progress Notes (Signed)
Mr. Rho was not feeling well.  Pt is complaining of nausea.  His guardian requested his vital signs be checked.  BP was low at 79/56.  Dr. Ma Hillock notified and stated for the patient to go to the Emergency Room.  His guardian advised of Dr. Beverly Gust recommendation and stated she would take him over to the ER now.

## 2014-10-15 NOTE — Discharge Instructions (Signed)
Dehydration, Adult Dehydration means your body does not have as much fluid as it needs. Your kidneys, brain, and heart will not work properly without the right amount of fluids and salt.  HOME CARE  Ask your doctor how to replace body fluid losses (rehydrate).  Drink enough fluids to keep your pee (urine) clear or pale yellow.  Drink small amounts of fluids often if you feel sick to your stomach (nauseous) or throw up (vomit).  Eat like you normally do.  Avoid:  Foods or drinks high in sugar.  Bubbly (carbonated) drinks.  Juice.  Very hot or cold fluids.  Drinks with caffeine.  Fatty, greasy foods.  Alcohol.  Tobacco.  Eating too much.  Gelatin desserts.  Wash your hands to avoid spreading germs (bacteria, viruses).  Only take medicine as told by your doctor.  Keep all doctor visits as told. GET HELP RIGHT AWAY IF:   You cannot drink something without throwing up.  You get worse even with treatment.  Your vomit has blood in it or looks greenish.  Your poop (stool) has blood in it or looks black and tarry.  You have not peed in 6 to 8 hours.  You pee a small amount of very dark pee.  You have a fever.  You pass out (faint).  You have belly (abdominal) pain that gets worse or stays in one spot (localizes).  You have a rash, stiff neck, or bad headache.  You get easily annoyed, sleepy, or are hard to wake up.  You feel weak, dizzy, or very thirsty. MAKE SURE YOU:   Understand these instructions.  Will watch your condition.  Will get help right away if you are not doing well or get worse. Document Released: 01/29/2009 Document Revised: 06/27/2011 Document Reviewed: 11/22/2010 [facility] Patient Information 2015 [place], [place]. This information is not intended to replace advice given to you by your health care provider. Make sure you discuss any questions you have with your health care provider.  Nausea and Vomiting Nausea is a sick feeling that  often comes before throwing up (vomiting). Vomiting is a reflex where stomach contents come out of your mouth. Vomiting can cause severe loss of body fluids (dehydration). Children and elderly adults can become dehydrated quickly, especially if they also have diarrhea. Nausea and vomiting are symptoms of a condition or disease. It is important to find the cause of your symptoms. CAUSES   Direct irritation of the stomach lining. This irritation can result from increased acid production (gastroesophageal reflux disease), infection, food poisoning, taking certain medicines (such as nonsteroidal anti-inflammatory drugs), alcohol use, or tobacco use.  Signals from the brain.These signals could be caused by a headache, heat exposure, an inner ear disturbance, increased pressure in the brain from injury, infection, a tumor, or a concussion, pain, emotional stimulus, or metabolic problems.  An obstruction in the gastrointestinal tract (bowel obstruction).  Illnesses such as diabetes, hepatitis, gallbladder problems, appendicitis, kidney problems, cancer, sepsis, atypical symptoms of a heart attack, or eating disorders.  Medical treatments such as chemotherapy and radiation.  Receiving medicine that makes you sleep (general anesthetic) during surgery. DIAGNOSIS Your caregiver may ask for tests to be done if the problems do not improve after a few days. Tests may also be done if symptoms are severe or if the reason for the nausea and vomiting is not clear. Tests may include:  Urine tests.  Blood tests.  Stool tests.  Cultures (to look for evidence of infection).  X-rays or  other imaging studies. Test results can help your caregiver make decisions about treatment or the need for additional tests. TREATMENT You need to stay well hydrated. Drink frequently but in small amounts.You may wish to drink water, sports drinks, clear broth, or eat frozen ice pops or gelatin dessert to help stay  hydrated.When you eat, eating slowly may help prevent nausea.There are also some antinausea medicines that may help prevent nausea. HOME CARE INSTRUCTIONS   Take all medicine as directed by your caregiver.  If you do not have an appetite, do not force yourself to eat. However, you must continue to drink fluids.  If you have an appetite, eat a normal diet unless your caregiver tells you differently.  Eat a variety of complex carbohydrates (rice, wheat, potatoes, bread), lean meats, yogurt, fruits, and vegetables.  Avoid high-fat foods because they are more difficult to digest.  Drink enough water and fluids to keep your urine clear or pale yellow.  If you are dehydrated, ask your caregiver for specific rehydration instructions. Signs of dehydration may include:  Severe thirst.  Dry lips and mouth.  Dizziness.  Dark urine.  Decreasing urine frequency and amount.  Confusion.  Rapid breathing or pulse. SEEK IMMEDIATE MEDICAL CARE IF:   You have blood or brown flecks (like coffee grounds) in your vomit.  You have black or bloody stools.  You have a severe headache or stiff neck.  You are confused.  You have severe abdominal pain.  You have chest pain or trouble breathing.  You do not urinate at least once every 8 hours.  You develop cold or clammy skin.  You continue to vomit for longer than 24 to 48 hours.  You have a fever. MAKE SURE YOU:   Understand these instructions.  Will watch your condition.  Will get help right away if you are not doing well or get worse. Document Released: 04/04/2005 Document Revised: 06/27/2011 Document Reviewed: 09/01/2010 [facility] Patient Information 2015 [place], [place]. This information is not intended to replace advice given to you by your health care provider. Make sure you discuss any questions you have with your health care provider.

## 2014-10-15 NOTE — ED Notes (Signed)
Pt to ED from the [facility] with c/o n,v and hypotension, went to cancer center this am for radiation but unable to receive radiation due to hypotension, pt has lung CA

## 2014-10-15 NOTE — ED Provider Notes (Signed)
[facility] Emergency Department Provider Note  ____________________________________________  Time seen: 10:10 AM  I have reviewed the triage vital signs and the nursing notes.   HISTORY  Chief Complaint Emesis and Hypotension    HPI Travis Maddox is a 59 y.o. male who sent to the ED from cancer center due to nausea vomiting and hypotension. Patient has lung cancer and has been getting radiation therapy 5 times a week as well as chemotherapy, the last chemotherapy was 9 days ago. No fever or chills. No cough shortness of breath chest pain dysuria frequency urgency or syncope. The patient has generalized weakness and malaise.He came to his scheduled appointment at the cancer center for radiation today and was noted to be hypotensive, and sent to the ED.     Past Medical History  Diagnosis Date  . Schizophrenia   . Diabetes mellitus without complication   . Hypertension   . Hyperlipemia   . Chronic fatigue   . Squamous cell carcinoma of right lung 08/22/2014    Patient Active Problem List   Diagnosis Date Noted  . Squamous cell carcinoma of right lung 08/22/2014    Past Surgical History  Procedure Laterality Date  . Colonoscopy    . Peripheral vascular catheterization N/A 08/21/2014    Procedure: Glori Luis Cath Insertion;  Surgeon: Algernon Huxley, MD;  Location: [facility];  Service: Cardiovascular;  Laterality: N/A;    Current Outpatient Rx  Name  Route  Sig  Dispense  Refill  . atorvastatin (LIPITOR) 10 MG tablet   Oral   Take 10 mg by mouth every morning.          Marland Kitchen levothyroxine (SYNTHROID, LEVOTHROID) 88 MCG tablet   Oral   Take 88 mcg by mouth every morning.          Marland Kitchen lisinopril (PRINIVIL,ZESTRIL) 5 MG tablet   Oral   Take 5 mg by mouth daily.         Marland Kitchen OLANZapine (ZYPREXA) 20 MG tablet   Oral   Take 30 mg by mouth at bedtime.          . prochlorperazine (COMPAZINE) 10 MG tablet   Oral   Take 10 mg by mouth every 6  (six) hours as needed for nausea or vomiting.         . propranolol (INDERAL) 10 MG tablet   Oral   Take 10 mg by mouth 3 (three) times daily.         . Vitamin D, Ergocalciferol, (DRISDOL) 50000 UNITS CAPS capsule   Oral   Take 50,000 Units by mouth every 7 (seven) days. On Thursday         . ondansetron (ZOFRAN) 4 MG tablet   Oral   Take 1 tablet (4 mg total) by mouth every 6 (six) hours as needed for nausea or vomiting.   20 tablet   1     Allergies Review of patient's allergies indicates no known allergies.  No family history on file.  Social History History  Substance Use Topics  . Smoking status: Current Every Day Smoker -- 0.25 packs/day for 42 years    Types: Cigarettes  . Smokeless tobacco: Not on file  . Alcohol Use: 1.2 oz/week    2 Cans of beer per week    Review of Systems  Constitutional: No fever or chills. No weight changes Eyes:No blurry vision or double vision.  ENT: No sore throat. Cardiovascular: No chest pain. Respiratory: No  dyspnea or cough. Gastrointestinal: Generalized abdominal pain and vomiting as above.  No BRBPR or melena. Genitourinary: Negative for dysuria, urinary retention, bloody urine, or difficulty urinating. Musculoskeletal: Negative for back pain. No joint swelling or pain. Skin: Negative for rash. Neurological: Negative for headaches, focal weakness or numbness. Psychiatric:No anxiety or depression.   Endocrine:No hot/cold intolerance, changes in energy, or sleep difficulty.  10-point ROS otherwise negative.  ____________________________________________   PHYSICAL EXAM:  VITAL SIGNS: ED Triage Vitals  Enc Vitals Group     BP 10/15/14 0957 83/62 mmHg     Pulse Rate 10/15/14 0957 108     Resp 10/15/14 0957 18     Temp 10/15/14 0957 97.5 F (36.4 C)     Temp Source 10/15/14 0957 Oral     SpO2 10/15/14 0957 96 %     Weight 10/15/14 0957 204 lb (92.534 kg)     Height 10/15/14 0957 6' (1.829 m)     Head Cir --       Peak Flow --      Pain Score 10/15/14 0958 0     Pain Loc --      Pain Edu? --      Excl. in [place]? --      Constitutional: Alert and oriented. Mild distress. Eyes: No scleral icterus. No conjunctival pallor. PERRL. EOMI ENT   Head: Normocephalic and atraumatic.   Nose: No congestion/rhinnorhea. No septal hematoma   Mouth/Throat: Dry mucous membranes, no pharyngeal erythema. No peritonsillar mass. No uvula shift.   Neck: No stridor. No SubQ emphysema. No meningismus. Hematological/Lymphatic/Immunilogical: No cervical lymphadenopathy. Cardiovascular: Tachycardia heart rate 110. Normal and symmetric distal pulses are present in all extremities. No murmurs, rubs, or gallops. Respiratory: Normal respiratory effort without tachypnea nor retractions. Breath sounds are clear and equal bilaterally. No wheezes/rales/rhonchi. Gastrointestinal: Soft with diffuse tenderness. No distention. There is no CVA tenderness.  No rebound, rigidity, or guarding. Genitourinary: deferred Musculoskeletal: Nontender with normal range of motion in all extremities. No joint effusions.  No lower extremity tenderness.  No edema. Neurologic:   Baseline speech which is slurred..  CN 2-10 normal. Motor grossly intact. No pronator drift.  Normal gait. No gross focal neurologic deficits are appreciated.  Skin:  Skin is warm, dry and intact. No rash noted.  No petechiae, purpura, or bullae. Psychiatric: Mood and affect are normal. Speech and behavior are normal. Patient exhibits appropriate insight and judgment.  ____________________________________________    LABS (pertinent positives/negatives) (all labs ordered are listed, but only abnormal results are displayed) Labs Reviewed  COMPREHENSIVE METABOLIC PANEL - Abnormal; Notable for the following:    Sodium 131 (*)    Chloride 92 (*)    Glucose, Bld 127 (*)    BUN 23 (*)    Creatinine, Ser 1.91 (*)    Total Protein 6.1 (*)    Albumin 3.2 (*)     GFR calc non Af Amer 37 (*)    GFR calc Af Amer 43 (*)    All other components within normal limits  CBC WITH DIFFERENTIAL/PLATELET - Abnormal; Notable for the following:    RBC 2.75 (*)    Hemoglobin 8.4 (*)    HCT 25.1 (*)    RDW 17.0 (*)    Lymphs Abs 0.4 (*)    All other components within normal limits  LIPASE, BLOOD  TROPONIN I   ____________________________________________   EKG  Interpreted by me  Date: 10/15/2014  Rate: 93  Rhythm: normal sinus rhythm  QRS  Axis: normal  Intervals: normal  ST/T Wave abnormalities: normal  Conduction Disutrbances: none  Narrative Interpretation: unremarkable      ____________________________________________    RADIOLOGY  Chest x-ray unremarkable CT abdomen and pelvis unremarkable  ____________________________________________   PROCEDURES CRITICAL CARE Performed by: Joni Fears, Trayquan Kolakowski   Total critical care time: 35 minutes  Critical care time was exclusive of separately billable procedures and treating other patients.  Critical care was necessary to treat or prevent imminent or life-threatening deterioration.  Critical care was time spent personally by me on the following activities: development of treatment plan with patient and/or surrogate as well as nursing, discussions with consultants, evaluation of patient's response to treatment, examination of patient, obtaining history from patient or surrogate, ordering and performing treatments and interventions, ordering and review of laboratory studies, ordering and review of radiographic studies, pulse oximetry and re-evaluation of patient's condition.  ____________________________________________   INITIAL IMPRESSION / ASSESSMENT AND PLAN / ED COURSE  Pertinent labs & imaging results that were available during my care of the patient were reviewed by me and considered in my medical decision making (see chart for details).  Patient presents with hypotension  tachycardia and nausea vomiting. This appears to be most likely due to dehydration related to his chemotherapy. Radiation therapy. We'll check labs and give aggressive IV fluids. ----------------------------------------- 3:39 PM on 10/15/2014 ----------------------------------------- With IV fluids the patient's vital signs have normalized. He feels well and back to baseline. He is no longer vomiting and is tolerating oral intake. Workup is unremarkable except for a mildly decreased GFR which is likely related to dehydration and will improve now that he has been rehydrated. Workup is otherwise unremarkable. He is not neutropenic. There is no evidence of infection or intra-abdominal pathology. I'll discharge the patient home and have him follow up with primary care and cancer Center. As vital signs normalized right away after the initiation of IV fluid resuscitation and he is completely nontoxic and well-appearing, he is suitable for discharge home despite the episode of hypotension. Low suspicion for sepsis.   ____________________________________________   FINAL CLINICAL IMPRESSION(S) / ED DIAGNOSES  Final diagnoses:  Non-intractable vomiting with nausea, vomiting of unspecified type  Dehydration      Carrie Mew, MD 10/15/14 1540

## 2014-10-16 ENCOUNTER — Ambulatory Visit
Admission: RE | Admit: 2014-10-16 | Discharge: 2014-10-16 | Disposition: A | Discharge status: Home / Self Care | Payer: Medicare Other | Source: Ambulatory Visit | Attending: Radiation Oncology | Admitting: Radiation Oncology

## 2014-10-16 ENCOUNTER — Ambulatory Visit: Payer: Medicare Other

## 2014-10-16 DIAGNOSIS — Z510 Encounter for antineoplastic radiation therapy: Secondary | ICD-10-CM | POA: Diagnosis not present

## 2014-10-17 ENCOUNTER — Ambulatory Visit
Admission: RE | Admit: 2014-10-17 | Discharge: 2014-10-17 | Disposition: A | Discharge status: Home / Self Care | Payer: Medicare Other | Source: Ambulatory Visit | Attending: Radiation Oncology | Admitting: Radiation Oncology

## 2014-10-17 ENCOUNTER — Ambulatory Visit: Payer: Medicare Other

## 2014-10-17 DIAGNOSIS — Z510 Encounter for antineoplastic radiation therapy: Secondary | ICD-10-CM | POA: Diagnosis not present

## 2014-10-21 ENCOUNTER — Ambulatory Visit: Payer: Medicare Other | Admitting: Internal Medicine

## 2014-10-21 ENCOUNTER — Other Ambulatory Visit: Payer: Medicare Other

## 2014-10-21 ENCOUNTER — Inpatient Hospital Stay: Payer: Medicare Other

## 2014-10-21 ENCOUNTER — Inpatient Hospital Stay: Payer: Medicare Other | Attending: Internal Medicine

## 2014-10-21 ENCOUNTER — Ambulatory Visit: Payer: Medicare Other

## 2014-10-21 ENCOUNTER — Ambulatory Visit
Admission: RE | Admit: 2014-10-21 | Discharge: 2014-10-21 | Disposition: A | Discharge status: Home / Self Care | Payer: Medicare Other | Source: Ambulatory Visit | Attending: Radiation Oncology | Admitting: Radiation Oncology

## 2014-10-21 DIAGNOSIS — Z5111 Encounter for antineoplastic chemotherapy: Secondary | ICD-10-CM | POA: Diagnosis present

## 2014-10-21 DIAGNOSIS — R634 Abnormal weight loss: Secondary | ICD-10-CM | POA: Diagnosis not present

## 2014-10-21 DIAGNOSIS — F17210 Nicotine dependence, cigarettes, uncomplicated: Secondary | ICD-10-CM | POA: Diagnosis not present

## 2014-10-21 DIAGNOSIS — E785 Hyperlipidemia, unspecified: Secondary | ICD-10-CM | POA: Diagnosis not present

## 2014-10-21 DIAGNOSIS — Z79899 Other long term (current) drug therapy: Secondary | ICD-10-CM | POA: Insufficient documentation

## 2014-10-21 DIAGNOSIS — R110 Nausea: Secondary | ICD-10-CM | POA: Insufficient documentation

## 2014-10-21 DIAGNOSIS — C3411 Malignant neoplasm of upper lobe, right bronchus or lung: Secondary | ICD-10-CM | POA: Insufficient documentation

## 2014-10-21 DIAGNOSIS — I10 Essential (primary) hypertension: Secondary | ICD-10-CM | POA: Diagnosis not present

## 2014-10-21 DIAGNOSIS — R630 Anorexia: Secondary | ICD-10-CM | POA: Diagnosis not present

## 2014-10-21 DIAGNOSIS — R42 Dizziness and giddiness: Secondary | ICD-10-CM | POA: Insufficient documentation

## 2014-10-21 DIAGNOSIS — C3491 Malignant neoplasm of unspecified part of right bronchus or lung: Secondary | ICD-10-CM

## 2014-10-21 DIAGNOSIS — F209 Schizophrenia, unspecified: Secondary | ICD-10-CM | POA: Insufficient documentation

## 2014-10-21 DIAGNOSIS — R5382 Chronic fatigue, unspecified: Secondary | ICD-10-CM | POA: Insufficient documentation

## 2014-10-21 DIAGNOSIS — D649 Anemia, unspecified: Secondary | ICD-10-CM | POA: Diagnosis not present

## 2014-10-21 DIAGNOSIS — E039 Hypothyroidism, unspecified: Secondary | ICD-10-CM | POA: Diagnosis not present

## 2014-10-21 DIAGNOSIS — Z510 Encounter for antineoplastic radiation therapy: Secondary | ICD-10-CM | POA: Diagnosis not present

## 2014-10-21 DIAGNOSIS — R944 Abnormal results of kidney function studies: Secondary | ICD-10-CM | POA: Insufficient documentation

## 2014-10-21 LAB — CBC WITH DIFFERENTIAL/PLATELET
BASOS ABS: 0 10*3/uL (ref 0–0.1)
BASOS PCT: 0 %
EOS ABS: 0 10*3/uL (ref 0–0.7)
Eosinophils Relative: 0 %
HCT: 28.4 % — ABNORMAL LOW (ref 40.0–52.0)
Hemoglobin: 9.4 g/dL — ABNORMAL LOW (ref 13.0–18.0)
Lymphocytes Relative: 16 %
Lymphs Abs: 0.8 10*3/uL — ABNORMAL LOW (ref 1.0–3.6)
MCH: 30.6 pg (ref 26.0–34.0)
MCHC: 33.1 g/dL (ref 32.0–36.0)
MCV: 92.6 fL (ref 80.0–100.0)
Monocytes Absolute: 0.7 10*3/uL (ref 0.2–1.0)
Monocytes Relative: 13 %
NEUTROS ABS: 3.5 10*3/uL (ref 1.4–6.5)
NEUTROS PCT: 71 %
PLATELETS: 249 10*3/uL (ref 150–440)
RBC: 3.07 MIL/uL — ABNORMAL LOW (ref 4.40–5.90)
RDW: 19.7 % — AB (ref 11.5–14.5)
WBC: 5 10*3/uL (ref 3.8–10.6)

## 2014-10-21 LAB — BASIC METABOLIC PANEL
Anion gap: 7 (ref 5–15)
BUN: 13 mg/dL (ref 6–20)
CALCIUM: 8.2 mg/dL — AB (ref 8.9–10.3)
CO2: 25 mmol/L (ref 22–32)
CREATININE: 1.07 mg/dL (ref 0.61–1.24)
Chloride: 96 mmol/L — ABNORMAL LOW (ref 101–111)
GFR calc non Af Amer: >60 mL/min (ref >60)
Glucose, Bld: 112 mg/dL — ABNORMAL HIGH (ref 65–99)
Potassium: 4.2 mmol/L (ref 3.5–5.1)
Sodium: 128 mmol/L — ABNORMAL LOW (ref 135–145)

## 2014-10-21 LAB — HEPATIC FUNCTION PANEL
ALBUMIN: 3.3 g/dL — AB (ref 3.5–5.0)
ALT: 23 U/L (ref 17–63)
AST: 21 U/L (ref 15–41)
Alkaline Phosphatase: 82 U/L (ref 38–126)
Bilirubin, Direct: <0.1 mg/dL — ABNORMAL LOW (ref 0.1–0.5)
TOTAL PROTEIN: 6.3 g/dL — AB (ref 6.5–8.1)
Total Bilirubin: 0.6 mg/dL (ref 0.3–1.2)

## 2014-10-21 MED ORDER — HEPARIN SOD (PORK) LOCK FLUSH 100 UNIT/ML IV SOLN
500.0000 [IU] | Freq: Once | INTRAVENOUS | Status: AC | PRN
  Administered 2014-10-21: 500 [IU]
  Filled 2014-10-21: qty 5

## 2014-10-21 MED ORDER — FAMOTIDINE IN NACL 20-0.9 MG/50ML-% IV SOLN
20.0000 mg | Freq: Once | INTRAVENOUS | Status: AC
  Administered 2014-10-21: 20 mg via INTRAVENOUS
  Filled 2014-10-21: qty 50

## 2014-10-21 MED ORDER — CARBOPLATIN CHEMO INJECTION 450 MG/45ML
290.0000 mg | Freq: Once | INTRAVENOUS | Status: AC
  Administered 2014-10-21: 290 mg via INTRAVENOUS
  Filled 2014-10-21: qty 29

## 2014-10-21 MED ORDER — DIPHENHYDRAMINE HCL 50 MG/ML IJ SOLN
25.0000 mg | Freq: Once | INTRAMUSCULAR | Status: AC
  Administered 2014-10-21: 25 mg via INTRAVENOUS
  Filled 2014-10-21: qty 1

## 2014-10-21 MED ORDER — DEXTROSE 5 % IV SOLN
60.0000 mg/m2 | Freq: Once | INTRAVENOUS | Status: AC
  Administered 2014-10-21: 138 mg via INTRAVENOUS
  Filled 2014-10-21: qty 23

## 2014-10-21 MED ORDER — SODIUM CHLORIDE 0.9 % IV SOLN
Freq: Once | INTRAVENOUS | Status: AC
  Administered 2014-10-21: 11:00:00 via INTRAVENOUS
  Filled 2014-10-21: qty 1000

## 2014-10-21 MED ORDER — SODIUM CHLORIDE 0.9 % IV SOLN
Freq: Once | INTRAVENOUS | Status: AC
  Administered 2014-10-21: 11:00:00 via INTRAVENOUS
  Filled 2014-10-21: qty 8

## 2014-10-21 MED ORDER — SODIUM CHLORIDE 0.9 % IJ SOLN
10.0000 mL | INTRAMUSCULAR | Status: DC | PRN
  Administered 2014-10-21: 10 mL
  Filled 2014-10-21: qty 10

## 2014-10-22 ENCOUNTER — Ambulatory Visit: Payer: Medicare Other

## 2014-10-28 ENCOUNTER — Inpatient Hospital Stay (HOSPITAL_BASED_OUTPATIENT_CLINIC_OR_DEPARTMENT_OTHER): Payer: Medicare Other | Admitting: Internal Medicine

## 2014-10-28 ENCOUNTER — Inpatient Hospital Stay: Payer: Medicare Other

## 2014-10-28 VITALS — BP 103/67 | HR 97 | Temp 96.4°F | Resp 20 | Ht 72.0 in | Wt 197.5 lb

## 2014-10-28 DIAGNOSIS — R110 Nausea: Secondary | ICD-10-CM

## 2014-10-28 DIAGNOSIS — E785 Hyperlipidemia, unspecified: Secondary | ICD-10-CM

## 2014-10-28 DIAGNOSIS — R944 Abnormal results of kidney function studies: Secondary | ICD-10-CM

## 2014-10-28 DIAGNOSIS — D649 Anemia, unspecified: Secondary | ICD-10-CM

## 2014-10-28 DIAGNOSIS — C3491 Malignant neoplasm of unspecified part of right bronchus or lung: Secondary | ICD-10-CM

## 2014-10-28 DIAGNOSIS — Z5111 Encounter for antineoplastic chemotherapy: Secondary | ICD-10-CM | POA: Diagnosis not present

## 2014-10-28 DIAGNOSIS — R42 Dizziness and giddiness: Secondary | ICD-10-CM

## 2014-10-28 DIAGNOSIS — R634 Abnormal weight loss: Secondary | ICD-10-CM

## 2014-10-28 DIAGNOSIS — C3411 Malignant neoplasm of upper lobe, right bronchus or lung: Secondary | ICD-10-CM

## 2014-10-28 DIAGNOSIS — E039 Hypothyroidism, unspecified: Secondary | ICD-10-CM

## 2014-10-28 DIAGNOSIS — I10 Essential (primary) hypertension: Secondary | ICD-10-CM

## 2014-10-28 DIAGNOSIS — R630 Anorexia: Secondary | ICD-10-CM

## 2014-10-28 DIAGNOSIS — R5382 Chronic fatigue, unspecified: Secondary | ICD-10-CM

## 2014-10-28 DIAGNOSIS — F209 Schizophrenia, unspecified: Secondary | ICD-10-CM

## 2014-10-28 LAB — CBC WITH DIFFERENTIAL/PLATELET
BASOS ABS: 0 10*3/uL (ref 0–0.1)
Basophils Relative: 0 %
Eosinophils Absolute: 0 10*3/uL (ref 0–0.7)
Eosinophils Relative: 0 %
HCT: 26.4 % — ABNORMAL LOW (ref 40.0–52.0)
HEMOGLOBIN: 8.8 g/dL — AB (ref 13.0–18.0)
LYMPHS ABS: 0.9 10*3/uL — AB (ref 1.0–3.6)
Lymphocytes Relative: 11 %
MCH: 30.6 pg (ref 26.0–34.0)
MCHC: 33.4 g/dL (ref 32.0–36.0)
MCV: 91.7 fL (ref 80.0–100.0)
MONOS PCT: 10 %
Monocytes Absolute: 0.9 10*3/uL (ref 0.2–1.0)
Neutro Abs: 6.7 10*3/uL — ABNORMAL HIGH (ref 1.4–6.5)
Neutrophils Relative %: 79 %
Platelets: 353 10*3/uL (ref 150–440)
RBC: 2.88 MIL/uL — ABNORMAL LOW (ref 4.40–5.90)
RDW: 18.6 % — ABNORMAL HIGH (ref 11.5–14.5)
WBC: 8.5 10*3/uL (ref 3.8–10.6)

## 2014-10-28 LAB — BASIC METABOLIC PANEL
Anion gap: 8 (ref 5–15)
BUN: 24 mg/dL — ABNORMAL HIGH (ref 6–20)
CHLORIDE: 91 mmol/L — AB (ref 101–111)
CO2: 26 mmol/L (ref 22–32)
CREATININE: 1.5 mg/dL — AB (ref 0.61–1.24)
Calcium: 8.5 mg/dL — ABNORMAL LOW (ref 8.9–10.3)
GFR, EST AFRICAN AMERICAN: 58 mL/min — AB (ref >60)
GFR, EST NON AFRICAN AMERICAN: 50 mL/min — AB (ref >60)
Glucose, Bld: 117 mg/dL — ABNORMAL HIGH (ref 65–99)
Potassium: 4 mmol/L (ref 3.5–5.1)
SODIUM: 125 mmol/L — AB (ref 135–145)

## 2014-10-28 MED ORDER — HEPARIN SOD (PORK) LOCK FLUSH 100 UNIT/ML IV SOLN
500.0000 [IU] | Freq: Once | INTRAVENOUS | Status: AC | PRN
  Administered 2014-10-28: 500 [IU]

## 2014-10-28 MED ORDER — SODIUM CHLORIDE 0.9 % IV SOLN
Freq: Once | INTRAVENOUS | Status: AC
  Administered 2014-10-28: 10:00:00 via INTRAVENOUS
  Filled 2014-10-28: qty 1000

## 2014-10-28 MED ORDER — SODIUM CHLORIDE 0.9 % IV SOLN
Freq: Once | INTRAVENOUS | Status: AC
  Administered 2014-10-28: 11:00:00 via INTRAVENOUS
  Filled 2014-10-28: qty 4

## 2014-10-28 MED ORDER — HEPARIN SOD (PORK) LOCK FLUSH 100 UNIT/ML IV SOLN
INTRAVENOUS | Status: AC
  Filled 2014-10-28: qty 5

## 2014-10-28 NOTE — Progress Notes (Signed)
Pt still with cough and sometimes productive cough of red and mixed with clear sputum.

## 2014-10-30 ENCOUNTER — Inpatient Hospital Stay: Payer: Medicare Other

## 2014-10-30 ENCOUNTER — Other Ambulatory Visit: Payer: Self-pay | Admitting: Internal Medicine

## 2014-10-30 VITALS — BP 102/61 | HR 84 | Temp 95.2°F | Resp 20

## 2014-10-30 DIAGNOSIS — C3491 Malignant neoplasm of unspecified part of right bronchus or lung: Secondary | ICD-10-CM

## 2014-10-30 DIAGNOSIS — C801 Malignant (primary) neoplasm, unspecified: Secondary | ICD-10-CM

## 2014-10-30 DIAGNOSIS — Z5111 Encounter for antineoplastic chemotherapy: Secondary | ICD-10-CM | POA: Diagnosis not present

## 2014-10-30 LAB — BASIC METABOLIC PANEL
ANION GAP: 5 (ref 5–15)
BUN: 18 mg/dL (ref 6–20)
CO2: 25 mmol/L (ref 22–32)
Calcium: 7.4 mg/dL — ABNORMAL LOW (ref 8.9–10.3)
Chloride: 101 mmol/L (ref 101–111)
Creatinine, Ser: 1.15 mg/dL (ref 0.61–1.24)
GFR calc Af Amer: >60 mL/min (ref >60)
GLUCOSE: 158 mg/dL — AB (ref 65–99)
Potassium: 3.1 mmol/L — ABNORMAL LOW (ref 3.5–5.1)
SODIUM: 131 mmol/L — AB (ref 135–145)

## 2014-10-30 MED ORDER — HEPARIN SOD (PORK) LOCK FLUSH 100 UNIT/ML IV SOLN
500.0000 [IU] | Freq: Once | INTRAVENOUS | Status: AC | PRN
  Administered 2014-10-30: 500 [IU]

## 2014-10-30 MED ORDER — HEPARIN SOD (PORK) LOCK FLUSH 100 UNIT/ML IV SOLN
INTRAVENOUS | Status: AC
  Filled 2014-10-30: qty 5

## 2014-10-30 MED ORDER — SODIUM CHLORIDE 0.9 % IJ SOLN
10.0000 mL | INTRAMUSCULAR | Status: DC | PRN
  Administered 2014-10-30: 10 mL
  Filled 2014-10-30: qty 10

## 2014-10-30 MED ORDER — SODIUM CHLORIDE 0.9 % IV SOLN
Freq: Once | INTRAVENOUS | Status: AC
Start: 2014-10-30 — End: 2014-10-30
  Administered 2014-10-30: 09:00:00 via INTRAVENOUS
  Filled 2014-10-30: qty 1000

## 2014-10-30 MED ORDER — POTASSIUM CHLORIDE CRYS ER 20 MEQ PO TBCR: 20.0000 meq | EXTENDED_RELEASE_TABLET | Freq: Every day | ORAL | Status: DC

## 2014-11-04 ENCOUNTER — Inpatient Hospital Stay: Payer: Medicare Other

## 2014-11-04 ENCOUNTER — Inpatient Hospital Stay (HOSPITAL_BASED_OUTPATIENT_CLINIC_OR_DEPARTMENT_OTHER): Payer: Medicare Other | Admitting: Internal Medicine

## 2014-11-04 VITALS — BP 103/63 | HR 80 | Temp 97.3°F | Resp 18 | Ht 72.0 in | Wt 203.0 lb

## 2014-11-04 VITALS — BP 110/78 | HR 80 | Resp 18

## 2014-11-04 DIAGNOSIS — R110 Nausea: Secondary | ICD-10-CM

## 2014-11-04 DIAGNOSIS — R5382 Chronic fatigue, unspecified: Secondary | ICD-10-CM

## 2014-11-04 DIAGNOSIS — C3491 Malignant neoplasm of unspecified part of right bronchus or lung: Secondary | ICD-10-CM

## 2014-11-04 DIAGNOSIS — C3411 Malignant neoplasm of upper lobe, right bronchus or lung: Secondary | ICD-10-CM

## 2014-11-04 DIAGNOSIS — R944 Abnormal results of kidney function studies: Secondary | ICD-10-CM | POA: Diagnosis not present

## 2014-11-04 DIAGNOSIS — R630 Anorexia: Secondary | ICD-10-CM

## 2014-11-04 DIAGNOSIS — R634 Abnormal weight loss: Secondary | ICD-10-CM

## 2014-11-04 DIAGNOSIS — Z5111 Encounter for antineoplastic chemotherapy: Secondary | ICD-10-CM | POA: Diagnosis not present

## 2014-11-04 DIAGNOSIS — C649 Malignant neoplasm of unspecified kidney, except renal pelvis: Secondary | ICD-10-CM

## 2014-11-04 DIAGNOSIS — F209 Schizophrenia, unspecified: Secondary | ICD-10-CM

## 2014-11-04 DIAGNOSIS — R42 Dizziness and giddiness: Secondary | ICD-10-CM

## 2014-11-04 DIAGNOSIS — E785 Hyperlipidemia, unspecified: Secondary | ICD-10-CM

## 2014-11-04 DIAGNOSIS — E039 Hypothyroidism, unspecified: Secondary | ICD-10-CM

## 2014-11-04 DIAGNOSIS — I10 Essential (primary) hypertension: Secondary | ICD-10-CM

## 2014-11-04 LAB — CBC WITH DIFFERENTIAL/PLATELET
Basophils Absolute: 0 10*3/uL (ref 0–0.1)
Basophils Relative: 0 %
Eosinophils Absolute: 0 10*3/uL (ref 0–0.7)
Eosinophils Relative: 0 %
HCT: 23.4 % — ABNORMAL LOW (ref 40.0–52.0)
Hemoglobin: 7.7 g/dL — ABNORMAL LOW (ref 13.0–18.0)
LYMPHS PCT: 18 %
Lymphs Abs: 0.9 10*3/uL — ABNORMAL LOW (ref 1.0–3.6)
MCH: 30.8 pg (ref 26.0–34.0)
MCHC: 32.7 g/dL (ref 32.0–36.0)
MCV: 94.3 fL (ref 80.0–100.0)
Monocytes Absolute: 0.9 10*3/uL (ref 0.2–1.0)
Monocytes Relative: 16 %
Neutro Abs: 3.4 10*3/uL (ref 1.4–6.5)
Neutrophils Relative %: 66 %
Platelets: 304 10*3/uL (ref 150–440)
RBC: 2.48 MIL/uL — ABNORMAL LOW (ref 4.40–5.90)
RDW: 19.7 % — ABNORMAL HIGH (ref 11.5–14.5)
WBC: 5.2 10*3/uL (ref 3.8–10.6)

## 2014-11-04 LAB — BASIC METABOLIC PANEL
ANION GAP: 7 (ref 5–15)
BUN: 13 mg/dL (ref 6–20)
CO2: 28 mmol/L (ref 22–32)
Calcium: 8 mg/dL — ABNORMAL LOW (ref 8.9–10.3)
Chloride: 96 mmol/L — ABNORMAL LOW (ref 101–111)
Creatinine, Ser: 1.06 mg/dL (ref 0.61–1.24)
GFR calc non Af Amer: >60 mL/min (ref >60)
GLUCOSE: 104 mg/dL — AB (ref 65–99)
Potassium: 4.2 mmol/L (ref 3.5–5.1)
SODIUM: 131 mmol/L — AB (ref 135–145)

## 2014-11-04 MED ORDER — HYDROCORTISONE NA SUCCINATE PF 100 MG IJ SOLR
100.0000 mg | Freq: Once | INTRAMUSCULAR | Status: AC
  Administered 2014-11-04: 100 mg via INTRAVENOUS

## 2014-11-04 MED ORDER — DIPHENHYDRAMINE HCL 50 MG/ML IJ SOLN
25.0000 mg | Freq: Once | INTRAMUSCULAR | Status: AC
  Administered 2014-11-04: 25 mg via INTRAVENOUS
  Filled 2014-11-04: qty 1

## 2014-11-04 MED ORDER — SODIUM CHLORIDE 0.9 % IV SOLN
Freq: Once | INTRAVENOUS | Status: AC
  Administered 2014-11-04: 11:00:00 via INTRAVENOUS
  Filled 2014-11-04: qty 1000

## 2014-11-04 MED ORDER — SODIUM CHLORIDE 0.9 % IV SOLN
268.0000 mg | Freq: Once | INTRAVENOUS | Status: AC
  Administered 2014-11-04: 270 mg via INTRAVENOUS
  Filled 2014-11-04: qty 27

## 2014-11-04 MED ORDER — FAMOTIDINE IN NACL 20-0.9 MG/50ML-% IV SOLN
20.0000 mg | Freq: Once | INTRAVENOUS | Status: AC
  Administered 2014-11-04: 20 mg via INTRAVENOUS
  Filled 2014-11-04: qty 50

## 2014-11-04 MED ORDER — SODIUM CHLORIDE 0.9 % IJ SOLN
10.0000 mL | INTRAMUSCULAR | Status: DC | PRN
Start: 2014-11-04 — End: 2014-11-04
  Administered 2014-11-04: 10 mL
  Filled 2014-11-04: qty 10

## 2014-11-04 MED ORDER — DEXTROSE 5 % IV SOLN
60.0000 mg/m2 | Freq: Once | INTRAVENOUS | Status: AC
  Administered 2014-11-04: 138 mg via INTRAVENOUS
  Filled 2014-11-04: qty 23

## 2014-11-04 MED ORDER — SODIUM CHLORIDE 0.9 % IV SOLN
Freq: Once | INTRAVENOUS | Status: AC
  Administered 2014-11-04: 12:00:00 via INTRAVENOUS
  Filled 2014-11-04: qty 8

## 2014-11-04 MED ORDER — DIPHENHYDRAMINE HCL 50 MG/ML IJ SOLN
25.0000 mg | Freq: Once | INTRAMUSCULAR | Status: AC | PRN
  Administered 2014-11-04: 12.5 mg via INTRAVENOUS

## 2014-11-04 MED ORDER — HEPARIN SOD (PORK) LOCK FLUSH 100 UNIT/ML IV SOLN
500.0000 [IU] | Freq: Once | INTRAVENOUS | Status: AC | PRN
  Administered 2014-11-04: 500 [IU]
  Filled 2014-11-04: qty 5

## 2014-11-04 NOTE — Progress Notes (Signed)
1435 Pt started to cough, went to bathroom and returned SOB, placed on oxygen, stated some difficulty breathing, face flushed. Had finished carbo before going to BR. DR Ma Hillock notified. meds given see MAR and pt observed for approx 1 hr 30 min. Color improved, breathing normal, denied any s/sx. See flow sheet for VS.

## 2014-11-04 NOTE — Progress Notes (Signed)
[place]  [phone] [phone]     ID: TALYN DESSERT OB: May 12, 1955  MR#: [id number]  OVF#:[id number]  Patient Care Team: [name], MD as PCP - General (Internal Medicine)  CHIEF COMPLAINT/DIAGNOSIS:  Right non-small cell right upper lobe lung cancer (Squamous), at least stage IIIB (possibly stage IV if left lung nodule is metastatic. On EBUS, could not get left lung nodule bx done due to BP falling). 07/30/14 - Bronchoscopy. DIAGNOSIS: LUNG, RIGHT HILUM; EBUS-GUIDED FNA: SQUAMOUS CELL CARCINOMA. 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 PET Scan on 07/17/14 - IMPRESSION: 1. Hypermetabolic central right hilar nodule is concern for primary bronchogenic carcinoma. 2. Consolidative process peripheral to this central nodule is felt to represent postobstructive pneumonitis; however cannot exclude local tumor extension. 3. Hypermetabolic nodule associated with a central right middle lobe bronchus is concern for other foci of carcinoma. 4. Hypermetabolic nodule in the parenchyma of the right middle lobe concern for metastatic disease. 5. Hypermetabolic nodule the left upper lobe either represents a secondary primary or metastatic disease. 6. No evidence of hypermetabolic mediastinal lymph nodes or distant metastasis.  On chemoradiation.    HISTORY OF PRESENT ILLNESS:  Patient returns for continued oncology followup and plan next dose of chemotherapy. Chemotherapy was held last week due to dehydration, worsening creatinine and he received IV fluids 2 days as outpatient. States that he is feeling much better. He is eating better and drinking lots of water/Gatorade. Physically active, denies any dizziness on getting up or ambulating. Chronic mild dyspnea on exertion is same along with intermittent cough. Denies hemoptysis, or chest pain. No fever or chills. No major pain issues, 0/10.   REVIEW OF SYSTEMS:   ROS  As in HPI above.  In addition, no fevers or sweats. No new headaches or focal weakness.  No new sore throat or dysphagia. No dizziness or palpitation. No abdominal pain, constipation, diarrhea, dysuria or hematuria. No new skin rash or bleeding symptoms. No new paresthesias in extremities.  PS ECOG 1.  PAST MEDICAL HISTORY: Reviewed Past Medical History  Diagnosis Date  . Schizophrenia   . Diabetes mellitus without complication   . Hypertension   . Hyperlipemia   . Chronic fatigue   . Squamous cell carcinoma of right lung 08/22/2014          Diabetes mellitus Hypertension Hyperlipidemia Hypothyroidism Chronic fatigue Colonoscopy >10 years ago, 2-3 small polyps removed per patient report  PAST SURGICAL HISTORY:Reviewed Past Surgical History  Procedure Laterality Date  . Colonoscopy    . Peripheral vascular catheterization N/A 08/21/2014    Procedure: Glori Luis Cath Insertion;  Surgeon: Algernon Huxley, MD;  Location: [facility];  Service: Cardiovascular;  Laterality: N/A;    FAMILY HISTORY:Reviewed Remarkable for stomach cancer.  ADVANCED DIRECTIVES:  No - patient declined information  SOCIAL HISTORY: Reviewed History  Substance Use Topics  . Smoking status: Current Every Day Smoker -- 0.25 packs/day for 42 years    Types: Cigarettes  . Smokeless tobacco: Not on file  . Alcohol Use: 1.2 oz/week    2 Cans of beer per week    No Known Allergies  Current Outpatient Prescriptions  Medication Sig Dispense Refill  . atorvastatin (LIPITOR) 10 MG tablet Take 10 mg by mouth every morning.     Marland Kitchen levothyroxine (SYNTHROID, LEVOTHROID) 88 MCG tablet Take 88 mcg by mouth every morning.     Marland Kitchen lisinopril (PRINIVIL,ZESTRIL) 5 MG tablet Take 5 mg by mouth daily.    Marland Kitchen  OLANZapine (ZYPREXA) 20 MG tablet Take 30 mg by mouth at bedtime.     . ondansetron (ZOFRAN) 4 MG tablet Take 1 tablet (4 mg total) by mouth every 6 (six) hours as needed for nausea or vomiting. 20  tablet 1  . potassium chloride SA (K-DUR,KLOR-CON) 20 MEQ tablet Take 1 tablet (20 mEq total) by mouth daily. 6 tablet 0  . prochlorperazine (COMPAZINE) 10 MG tablet Take 10 mg by mouth every 6 (six) hours as needed for nausea or vomiting.    . propranolol (INDERAL) 10 MG tablet Take 10 mg by mouth 3 (three) times daily.    . Vitamin D, Ergocalciferol, (DRISDOL) 50000 UNITS CAPS capsule Take 50,000 Units by mouth every 7 (seven) days. On Thursday     No current facility-administered medications for this visit.   Facility-Administered Medications Ordered in Other Visits  Medication Dose Route Frequency Provider Last Rate Last Dose  . CARBOplatin (PARAPLATIN) 270 mg in sodium chloride 0.9 % 100 mL chemo infusion  270 mg Intravenous Once Leia Alf, MD      . famotidine (PEPCID) IVPB 20 mg premix  20 mg Intravenous Once Leia Alf, MD   20 mg at 11/04/14 1130  . heparin lock flush 100 unit/mL  500 Units Intracatheter Once PRN Leia Alf, MD      . ondansetron (ZOFRAN) 16 mg, dexamethasone (DECADRON) 20 mg in sodium chloride 0.9 % 50 mL IVPB   Intravenous Once Leia Alf, MD      . PACLitaxel (TAXOL) 138 mg in dextrose 5 % 250 mL chemo infusion (</= '80mg'$ /m2)  60 mg/m2 (Treatment Plan Actual) Intravenous Once Leia Alf, MD      . sodium chloride 0.9 % injection 10 mL  10 mL Intracatheter PRN Leia Alf, MD        PHYSICAL EXAM: Filed Vitals:   11/04/14 1008  BP: 103/63  Pulse: 80  Temp: 97.3 F (36.3 C)  Resp: 18     Body mass index is 27.53 kg/(m^2).    ECOG FS:1 - Symptomatic but completely ambulatory  GENERAL: Patient overall looks better, alert and oriented and in no acute distress. No icterus. HEENT: EOMs intact. Oral exam negative for thrush, mouth is moist. No cervical lymphadenopathy. CVS: S1S2, regular LUNGS: Bilaterally clear to auscultation, no creps or rhonchi. ABDOMEN: Soft, nontender.    NEURO: grossly nonfocal, cranial nerves are intact. Gait  unremarkable. EXTREMITIES: No pedal edema.   LAB RESULTS:    Component Value Date/Time   NA 131* 11/04/2014 0915   NA 135 07/28/2014 1121   K 4.2 11/04/2014 0915   K 4.3 07/28/2014 1121   CL 96* 11/04/2014 0915   CL 101 07/28/2014 1121   CO2 28 11/04/2014 0915   CO2 25 07/28/2014 1121   GLUCOSE 104* 11/04/2014 0915   GLUCOSE 135* 07/28/2014 1121   BUN 13 11/04/2014 0915   BUN 19 07/28/2014 1121   CREATININE 1.06 11/04/2014 0915   CREATININE 1.36* 07/28/2014 1121   CALCIUM 8.0* 11/04/2014 0915   CALCIUM 9.1 07/28/2014 1121   PROT 6.3* 10/21/2014 1000   PROT 7.6 07/23/2014 1042   ALBUMIN 3.3* 10/21/2014 1000   ALBUMIN 4.2 07/23/2014 1042   AST 21 10/21/2014 1000   AST 24 07/23/2014 1042   ALT 23 10/21/2014 1000   ALT 24 07/23/2014 1042   ALKPHOS 82 10/21/2014 1000   ALKPHOS 108 07/23/2014 1042   BILITOT 0.6 10/21/2014 1000   BILITOT 0.5 07/23/2014 1042   GFRNONAA >60 11/04/2014 0915  GFRNONAA 57* 07/28/2014 1121   GFRAA >60 11/04/2014 0915   GFRAA >60 07/28/2014 1121   Lab Results  Component Value Date   WBC 5.2 11/04/2014   NEUTROABS 3.4 11/04/2014   HGB 7.7* 11/04/2014   HCT 23.4* 11/04/2014   MCV 94.3 11/04/2014   PLT 304 11/04/2014     STUDIES: 07/02/14 - CT scan of the chest with contrast - right upper lobe nodule with suggestion of endobronchial component (apical segment), concerning for primary malignancy. Opacities elsewhere in the right upper lobe could represent postobstructive atelectasis, obstructive pneumonia, or endobronchial spread of tumor. Left upper lobe spiculated mass 1.7 cm, favor a second primary, though metastasis also a consideration. No lymphadenopathy by size criteria. No appreciable distant metastasis. PET/CT could be considered for further staging evaluation. COPD/emphysema.  07/17/14 - PET scan. IMPRESSION: 1. Hypermetabolic central right hilar nodule is concern for primary bronchogenic carcinoma. 2. Consolidative process peripheral to  this central nodule is felt to represent postobstructive pneumonitis; however cannot exclude local tumor extension. 3. Hypermetabolic nodule associated with a central right middle lobe bronchus is concern for other foci of carcinoma. 4. Hypermetabolic nodule in the parenchyma of the right middle lobe concern for metastatic disease. 5. Hypermetabolic nodule the left upper lobe either represents a secondary primary or metastatic disease. 6. No evidence of hypermetabolic mediastinal lymph nodes or distant metastasis.  07/30/14 - Bronchoscopy. DIAGNOSIS: LUNG, RIGHT HILUM; EBUS-GUIDED FNA: SQUAMOUS CELL CARCINOMA. 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   STAGING: Squamous cell carcinoma of right lung   Staging form: Lung, AJCC 7th Edition     Clinical stage from 07/17/2014: Stage IV (T4, N0, M1a) - Signed by Leia Alf, MD on 09/29/2014.   ASSESSMENT / PLAN:    1. Right non-small cell right upper lobe lung cancer (Squamous), at least stage IIIB (possibly stage IV if left lung nodule is metastatic. On EBUS, could not get left lung nodule bx done due to BP falling). On 07/30/14, Bronchoscopy/EBUS. DIAGNOSIS: LUNG, RIGHT HILUM; EBUS-GUIDED FNA: SQUAMOUS CELL CARCINOMA. - Have reviewed labs from today and discussed with patient. Clinically he is doing much better as compared to last week. Creatinine has normalized. Patient is agreeable to resume on chemotherapy. Blood counts are adequate, will proceed with c3d8 dose of chemo with Taxol 60 mg/m2 IV and Carboplatin AUC of 2 IV today (3 weeks on and 1 week off cycle). Labs and chemotherapy at 1 week and 2 weeks from now. Next MD f/u at 4 weeks and plan continued treatment.  2. Anemia - likely from chemotherapy. Denies bleeding symptoms. Advised to take oral iron one tablet daily. We will monitor, may need transfusion support if hemoglobin drops less than 7. 3. Renal function test shows serum Cr is back in normal range. Have  advised to maintain increased oral fluid intake. Will monitor. 4. In between visits, patient advised to call or come to ER in case of any new symptoms or acute sickness. He is agreeable to this plan.   Leia Alf, MD   11/04/2014 11:37 AM

## 2014-11-04 NOTE — Progress Notes (Signed)
[facility]  [phone]) [phone] [phone]) [phone]     ID: Travis [facility]ox OB: May 01, 1955  MR#: [id number]  CSN#:[id number]  Patient Care Team: [name], MD as PCP - General (Internal Medicine)  CHIEF COMPLAINT/DIAGNOSIS:  Right non-small cell right upper lobe lung cancer (Squamous), at least stage IIIB (possibly stage IV if left lung nodule is metastatic. On EBUS, could not get left lung nodule bx done due to BP falling). 07/30/14 - Bronchoscopy. DIAGNOSIS: LUNG, RIGHT HILUM; EBUS-GUIDED FNA: SQUAMOUS CELL CARCINOMA. 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 PET Scan on 07/17/14 - IMPRESSION: 1. Hypermetabolic central right hilar nodule is concern for primary bronchogenic carcinoma. 2. Consolidative process peripheral to this central nodule is felt to represent postobstructive pneumonitis; however cannot exclude local tumor extension. 3. Hypermetabolic nodule associated with a central right middle lobe bronchus is concern for other foci of carcinoma. 4. Hypermetabolic nodule in the parenchyma of the right middle lobe concern for metastatic disease. 5. Hypermetabolic nodule the left upper lobe either represents a secondary primary or metastatic disease. 6. No evidence of hypermetabolic mediastinal lymph nodes or distant metastasis.    HISTORY OF PRESENT ILLNESS:  Patient returns for continued oncology followup and plan chemo for lung cancer. Serum creatinine today has increased to 1.50, it had been in the Travis range. If the patient does not be not eating and drinking well, nauseated. 2-3 days. He has mild lightheadedness when ambulating. He has lost some weight. Intermittent nausea. No diarrhea or bleeding symptoms. No fever or chills.Marland Kitchen   REVIEW OF SYSTEMS:   ROS As in HPI above. In addition, no new headaches or focal weakness.  No new mood disturbances. No  sore throat, cough, shortness of breath, sputum, hemoptysis or chest pain. No  dizziness or palpitation. No abdominal pain, constipation, diarrhea, dysuria or hematuria.  No new paresthesias in extremities.   PAST MEDICAL HISTORY: Past Medical History  Diagnosis Date  . Schizophrenia   . Diabetes mellitus without complication   . Hypertension   . Hyperlipemia   . Chronic fatigue   . Squamous cell carcinoma of right lung 08/22/2014          Diabetes mellitus  Hypertension  Hyperlipidemia  Hypothyroidism  Chronic fatigue  Colonoscopy >10 years ago, 2-3 small polyps removed per patient report.  PAST SURGICAL HISTORY: Past Surgical History  Procedure Laterality Date  . Colonoscopy    . Peripheral vascular catheterization N/A 08/21/2014    Procedure: [place] [facility] Cath Insertion;  Surgeon: Algernon Huxley, MD;  Location: [facility];  Service: Cardiovascular;  Laterality: N/A;    FAMILY HISTORY: remarkable for stomach cancer.   SOCIAL HISTORY: History  Substance Use Topics  . Smoking status: Current Every Day Smoker -- 0.25 packs/day for 42 years    Types: Cigarettes  . Smokeless tobacco: Not on file  . Alcohol Use: 1.2 oz/week    2 Cans of beer per week    No Known Allergies  Current Outpatient Prescriptions  Medication Sig Dispense Refill  . atorvastatin (LIPITOR) 10 MG tablet Take 10 mg by mouth every morning.     Marland Kitchen levothyroxine (SYNTHROID, LEVOTHROID) 88 MCG tablet Take 88 mcg by mouth every morning.     Marland Kitchen lisinopril (PRINIVIL,ZESTRIL) 5 MG tablet Take 5 mg by mouth daily.    Marland Kitchen OLANZapine (ZYPREXA) 20 MG tablet Take 30 mg by mouth at bedtime.     . ondansetron (ZOFRAN) 4 MG tablet Take 1 tablet (4 mg total)  by mouth every 6 (six) hours as needed for nausea or vomiting. 20 tablet 1  . prochlorperazine (COMPAZINE) 10 MG tablet Take 10 mg by mouth every 6 (six) hours as needed for nausea or vomiting.    . propranolol (INDERAL) 10 MG tablet Take 10 mg by mouth 3 (three) times daily.    . Vitamin D, Ergocalciferol, (DRISDOL) 50000 UNITS CAPS capsule Take  50,000 Units by mouth every 7 (seven) days. On Thursday    . potassium chloride SA (K-DUR,KLOR-CON) 20 MEQ tablet Take 1 tablet (20 mEq total) by mouth daily. 6 tablet 0   No current facility-administered medications for this visit.   Facility-Administered Medications Ordered in Other Visits  Medication Dose Route Frequency Provider Last Rate Last Dose  . CARBOplatin (PARAPLATIN) 270 mg in sodium chloride 0.9 % 100 mL chemo infusion  270 mg Intravenous Once Leia Alf, MD 254 mL/hr at 11/04/14 1345 270 mg at 11/04/14 1345  . heparin lock flush 100 unit/mL  500 Units Intracatheter Once PRN Leia Alf, MD      . sodium chloride 0.9 % injection 10 mL  10 mL Intracatheter PRN Leia Alf, MD   10 mL at 11/04/14 0933    OBJECTIVE: Filed Vitals:   10/28/14 0854  BP: 103/67  Pulse: 97  Temp: 96.4 F (35.8 C)  Resp: 20     Body mass index is 26.78 kg/(m^2).    ECOG FS:2 - Symptomatic, <50% confined to bed  GENERAL: Patient is weak and tired looking, otherwise alert and oriented and in no acute distress. There is no icterus. HEENT: EOMs intact. Oral exam negative for thrush or lesions, mouth is dry.  CVS: S1S2, regular LUNGS: Bilaterally clear to auscultation, no rhonchi. ABDOMEN: Soft, nontender.   NEURO: grossly nonfocal, cranial nerves are intact. Gait unremarkable. EXTREMITIES: No pedal edema.  LAB RESULTS:     Component Value Date/Time   NA 131* 11/04/2014 0915   NA 135 07/28/2014 1121   K 4.2 11/04/2014 0915   K 4.3 07/28/2014 1121   CL 96* 11/04/2014 0915   CL 101 07/28/2014 1121   CO2 28 11/04/2014 0915   CO2 25 07/28/2014 1121   GLUCOSE 104* 11/04/2014 0915   GLUCOSE 135* 07/28/2014 1121   BUN 13 11/04/2014 0915   BUN 19 07/28/2014 1121   CREATININE 1.06 11/04/2014 0915   CREATININE 1.36* 07/28/2014 1121   CALCIUM 8.0* 11/04/2014 0915   CALCIUM 9.1 07/28/2014 1121   PROT 6.3* 10/21/2014 1000   PROT 7.6 07/23/2014 1042   ALBUMIN 3.3* 10/21/2014 1000    ALBUMIN 4.2 07/23/2014 1042   AST 21 10/21/2014 1000   AST 24 07/23/2014 1042   ALT 23 10/21/2014 1000   ALT 24 07/23/2014 1042   ALKPHOS 82 10/21/2014 1000   ALKPHOS 108 07/23/2014 1042   BILITOT 0.6 10/21/2014 1000   BILITOT 0.5 07/23/2014 1042   GFRNONAA >60 11/04/2014 0915   GFRNONAA 57* 07/28/2014 1121   GFRAA >60 11/04/2014 0915   GFRAA >60 07/28/2014 1121         Chemistry      Component Value Date/Time   NA 131* 11/04/2014 0915   NA 135 07/28/2014 1121   K 4.2 11/04/2014 0915   K 4.3 07/28/2014 1121   CL 96* 11/04/2014 0915   CL 101 07/28/2014 1121   CO2 28 11/04/2014 0915   CO2 25 07/28/2014 1121   BUN 13 11/04/2014 0915   BUN 19 07/28/2014 1121   CREATININE 1.06 11/04/2014  0915   CREATININE 1.36* 07/28/2014 1121      Component Value Date/Time   CALCIUM 8.0* 11/04/2014 0915   CALCIUM 9.1 07/28/2014 1121   ALKPHOS 82 10/21/2014 1000   ALKPHOS 108 07/23/2014 1042   AST 21 10/21/2014 1000   AST 24 07/23/2014 1042   ALT 23 10/21/2014 1000   ALT 24 07/23/2014 1042   BILITOT 0.6 10/21/2014 1000   BILITOT 0.5 07/23/2014 1042      STUDIES: 07/02/14 - CT scan of the chest with contrast - right upper lobe nodule with suggestion of endobronchial component (apical segment), concerning for primary malignancy. Opacities elsewhere in the right upper lobe could represent postobstructive atelectasis, obstructive pneumonia, or endobronchial spread of tumor. Left upper lobe spiculated mass 1.7 cm, favor a second primary, though metastasis also a consideration. No lymphadenopathy by size criteria. No appreciable distant metastasis. PET/CT could be considered for further staging evaluation. COPD/emphysema.  07/17/14 - PET scan.  IMPRESSION: 1. Hypermetabolic central right hilar nodule is concern for primary bronchogenic carcinoma. 2. Consolidative process peripheral to this central nodule is felt to represent postobstructive pneumonitis; however cannot exclude local tumor  extension. 3. Hypermetabolic nodule associated with a central right middle lobe bronchus is concern for other foci of carcinoma. 4. Hypermetabolic nodule in the parenchyma of the right middle lobe concern for metastatic disease. 5. Hypermetabolic nodule the left upper lobe either represents a secondary primary or metastatic disease. 6. No evidence of hypermetabolic mediastinal lymph nodes or distant metastasis.  07/30/14 - Bronchoscopy. DIAGNOSIS: LUNG, RIGHT HILUM; EBUS-GUIDED FNA: SQUAMOUS CELL CARCINOMA. 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   ASSESSMENT / PLAN:   1. Right non-small cell right upper lobe lung cancer (Squamous), at least stage IIIB (possibly stage IV if left lung nodule is metastatic. On EBUS, could not get left lung nodule bx done due to BP falling). On 07/30/14,  Bronchoscopy/EBUS. DIAGNOSIS: LUNG, RIGHT HILUM; EBUS-GUIDED FNA: SQUAMOUS CELL CARCINOMA. -  Have reviewed labs and discussed with patient, had explained that the creatinine has increased up to 1.50. This is likely from completing [facility] dehydration since he is not eating and drinking well, intermittent nausea. Plan therefore is to hold chemotherapy today, we will give IV fluids 2 L half Travis saline today and again repeat on July 14 (states that he works part-time on his status and cannot come) along with Zofran 8 mg IV today. Will repeat metabolic panel on July 14 to make sure kidney function is getting better.  2. Anemia-likely secondary to chemotherapy effect. We will continue to monitor and transfuse as indicated.   3. In between visits, patient advised to call or come to ER in case of any new symptoms or acute sickness. He is agreeable to this plan.    Leia Alf, MD   11/04/2014 1:58 PM

## 2014-11-11 ENCOUNTER — Other Ambulatory Visit: Payer: Self-pay | Admitting: *Deleted

## 2014-11-11 ENCOUNTER — Inpatient Hospital Stay: Payer: Medicare Other

## 2014-11-11 VITALS — BP 104/63 | HR 74 | Temp 96.8°F | Resp 18

## 2014-11-11 DIAGNOSIS — Z5111 Encounter for antineoplastic chemotherapy: Secondary | ICD-10-CM | POA: Diagnosis not present

## 2014-11-11 DIAGNOSIS — C3411 Malignant neoplasm of upper lobe, right bronchus or lung: Secondary | ICD-10-CM

## 2014-11-11 DIAGNOSIS — R112 Nausea with vomiting, unspecified: Secondary | ICD-10-CM

## 2014-11-11 DIAGNOSIS — C3491 Malignant neoplasm of unspecified part of right bronchus or lung: Secondary | ICD-10-CM

## 2014-11-11 LAB — CBC WITH DIFFERENTIAL/PLATELET
Basophils Absolute: 0 10*3/uL (ref 0–0.1)
Basophils Relative: 0 %
Eosinophils Absolute: 0 10*3/uL (ref 0–0.7)
Eosinophils Relative: 0 %
HCT: 22.9 % — ABNORMAL LOW (ref 40.0–52.0)
Hemoglobin: 7.7 g/dL — ABNORMAL LOW (ref 13.0–18.0)
LYMPHS PCT: 17 %
Lymphs Abs: 1.2 10*3/uL (ref 1.0–3.6)
MCH: 31.6 pg (ref 26.0–34.0)
MCHC: 33.7 g/dL (ref 32.0–36.0)
MCV: 93.8 fL (ref 80.0–100.0)
Monocytes Absolute: 0.8 10*3/uL (ref 0.2–1.0)
Monocytes Relative: 11 %
NEUTROS ABS: 5 10*3/uL (ref 1.4–6.5)
Neutrophils Relative %: 72 %
Platelets: 279 10*3/uL (ref 150–440)
RBC: 2.45 MIL/uL — ABNORMAL LOW (ref 4.40–5.90)
RDW: 18.9 % — ABNORMAL HIGH (ref 11.5–14.5)
WBC: 7 10*3/uL (ref 3.8–10.6)

## 2014-11-11 LAB — BASIC METABOLIC PANEL
Anion gap: 9 (ref 5–15)
BUN: 21 mg/dL — AB (ref 6–20)
CALCIUM: 8.5 mg/dL — AB (ref 8.9–10.3)
CO2: 27 mmol/L (ref 22–32)
CREATININE: 1.63 mg/dL — AB (ref 0.61–1.24)
Chloride: 96 mmol/L — ABNORMAL LOW (ref 101–111)
GFR calc Af Amer: 52 mL/min — ABNORMAL LOW (ref >60)
GFR calc non Af Amer: 45 mL/min — ABNORMAL LOW (ref >60)
Glucose, Bld: 110 mg/dL — ABNORMAL HIGH (ref 65–99)
POTASSIUM: 4 mmol/L (ref 3.5–5.1)
Sodium: 132 mmol/L — ABNORMAL LOW (ref 135–145)

## 2014-11-11 LAB — APTT: Activated PTT: 34 secs (ref 23.6–35.9)

## 2014-11-11 LAB — PROTIME-INR
INR: 0.9
PROTHROMBIN TIME: 12.6 s

## 2014-11-11 MED ORDER — SODIUM CHLORIDE 0.9 % IV SOLN
INTRAVENOUS | Status: AC
  Administered 2014-11-11: 11:00:00 via INTRAVENOUS
  Filled 2014-11-11: qty 1000

## 2014-11-11 MED ORDER — HEPARIN SOD (PORK) LOCK FLUSH 100 UNIT/ML IV SOLN
500.0000 [IU] | Freq: Once | INTRAVENOUS | Status: AC | PRN
  Administered 2014-11-11: 500 [IU]
  Filled 2014-11-11: qty 5

## 2014-11-11 MED ORDER — SODIUM CHLORIDE 0.9 % IV SOLN
Freq: Once | INTRAVENOUS | Status: AC
  Administered 2014-11-11: 15:00:00 via INTRAVENOUS
  Filled 2014-11-11: qty 4

## 2014-11-11 MED ORDER — SUCRALFATE 1 G PO TABS: 1.0000 g | ORAL_TABLET | Freq: Three times a day (TID) | ORAL | Status: DC

## 2014-11-11 MED ORDER — SODIUM CHLORIDE 0.9 % IJ SOLN
10.0000 mL | INTRAMUSCULAR | Status: DC | PRN
  Administered 2014-11-11: 10 mL
  Filled 2014-11-11: qty 10

## 2014-11-18 ENCOUNTER — Inpatient Hospital Stay: Payer: Medicare Other | Attending: Internal Medicine

## 2014-11-18 ENCOUNTER — Inpatient Hospital Stay: Payer: Medicare Other

## 2014-11-18 VITALS — BP 95/78 | HR 78 | Temp 96.2°F | Resp 18

## 2014-11-18 DIAGNOSIS — Z79899 Other long term (current) drug therapy: Secondary | ICD-10-CM | POA: Insufficient documentation

## 2014-11-18 DIAGNOSIS — D649 Anemia, unspecified: Secondary | ICD-10-CM | POA: Insufficient documentation

## 2014-11-18 DIAGNOSIS — Z9221 Personal history of antineoplastic chemotherapy: Secondary | ICD-10-CM | POA: Diagnosis not present

## 2014-11-18 DIAGNOSIS — R630 Anorexia: Secondary | ICD-10-CM | POA: Insufficient documentation

## 2014-11-18 DIAGNOSIS — Z923 Personal history of irradiation: Secondary | ICD-10-CM | POA: Diagnosis not present

## 2014-11-18 DIAGNOSIS — C3411 Malignant neoplasm of upper lobe, right bronchus or lung: Secondary | ICD-10-CM | POA: Insufficient documentation

## 2014-11-18 DIAGNOSIS — I10 Essential (primary) hypertension: Secondary | ICD-10-CM | POA: Insufficient documentation

## 2014-11-18 DIAGNOSIS — R5382 Chronic fatigue, unspecified: Secondary | ICD-10-CM | POA: Insufficient documentation

## 2014-11-18 DIAGNOSIS — R918 Other nonspecific abnormal finding of lung field: Secondary | ICD-10-CM | POA: Insufficient documentation

## 2014-11-18 DIAGNOSIS — E119 Type 2 diabetes mellitus without complications: Secondary | ICD-10-CM | POA: Insufficient documentation

## 2014-11-18 DIAGNOSIS — F209 Schizophrenia, unspecified: Secondary | ICD-10-CM | POA: Diagnosis not present

## 2014-11-18 DIAGNOSIS — C3491 Malignant neoplasm of unspecified part of right bronchus or lung: Secondary | ICD-10-CM

## 2014-11-18 DIAGNOSIS — E785 Hyperlipidemia, unspecified: Secondary | ICD-10-CM | POA: Diagnosis not present

## 2014-11-18 LAB — CBC WITH DIFFERENTIAL/PLATELET
BASOS PCT: 0 %
Basophils Absolute: 0 10*3/uL (ref 0–0.1)
EOS ABS: 0 10*3/uL (ref 0–0.7)
EOS PCT: 0 %
HEMATOCRIT: 23.6 % — AB (ref 40.0–52.0)
HEMOGLOBIN: 7.8 g/dL — AB (ref 13.0–18.0)
LYMPHS ABS: 0.7 10*3/uL — AB (ref 1.0–3.6)
Lymphocytes Relative: 14 %
MCH: 31.5 pg (ref 26.0–34.0)
MCHC: 33.3 g/dL (ref 32.0–36.0)
MCV: 94.5 fL (ref 80.0–100.0)
MONO ABS: 0.8 10*3/uL (ref 0.2–1.0)
Monocytes Relative: 16 %
Neutro Abs: 3.7 10*3/uL (ref 1.4–6.5)
Neutrophils Relative %: 70 %
PLATELETS: 308 10*3/uL (ref 150–440)
RBC: 2.49 MIL/uL — ABNORMAL LOW (ref 4.40–5.90)
RDW: 18.1 % — ABNORMAL HIGH (ref 11.5–14.5)
WBC: 5.2 10*3/uL (ref 3.8–10.6)

## 2014-11-18 LAB — HEPATIC FUNCTION PANEL
ALBUMIN: 2.7 g/dL — AB (ref 3.5–5.0)
ALT: 17 U/L (ref 17–63)
AST: 22 U/L (ref 15–41)
Alkaline Phosphatase: 73 U/L (ref 38–126)
BILIRUBIN TOTAL: 0.4 mg/dL (ref 0.3–1.2)
Bilirubin, Direct: <0.1 mg/dL — ABNORMAL LOW (ref 0.1–0.5)
Total Protein: 6.1 g/dL — ABNORMAL LOW (ref 6.5–8.1)

## 2014-11-18 LAB — BASIC METABOLIC PANEL
Anion gap: 7 (ref 5–15)
BUN: 10 mg/dL (ref 6–20)
CALCIUM: 8.1 mg/dL — AB (ref 8.9–10.3)
CO2: 26 mmol/L (ref 22–32)
Chloride: 97 mmol/L — ABNORMAL LOW (ref 101–111)
Creatinine, Ser: 1.17 mg/dL (ref 0.61–1.24)
GFR calc non Af Amer: >60 mL/min (ref >60)
GLUCOSE: 119 mg/dL — AB (ref 65–99)
POTASSIUM: 3.5 mmol/L (ref 3.5–5.1)
Sodium: 130 mmol/L — ABNORMAL LOW (ref 135–145)

## 2014-11-18 MED ORDER — HEPARIN SOD (PORK) LOCK FLUSH 100 UNIT/ML IV SOLN
500.0000 [IU] | Freq: Once | INTRAVENOUS | Status: AC | PRN
  Filled 2014-11-18: qty 5

## 2014-11-18 MED ORDER — SODIUM CHLORIDE 0.9 % IJ SOLN
10.0000 mL | INTRAMUSCULAR | Status: DC | PRN
  Administered 2014-11-18: 10 mL
  Filled 2014-11-18: qty 10

## 2014-11-18 NOTE — Progress Notes (Signed)
Pt had reaction to [place] last tx .  MD on call DR Grayland Ormond notified. instr to hold tx and see Dr Ma Hillock next week.

## 2014-11-20 ENCOUNTER — Ambulatory Visit: Payer: Medicare Other | Admitting: Radiation Oncology

## 2014-11-22 NOTE — Progress Notes (Signed)
[place]  [phone] [phone]     ID: Travis Maddox OB: 14-Jan-1956  MR#: [id number]  QJJ#:[id number]  Patient Care Team: [name], MD as PCP - General (Internal Medicine)  CHIEF COMPLAINT/DIAGNOSIS:  Right non-small cell right upper lobe lung cancer (Squamous), at least stage IIIB (possibly stage IV if left lung nodule is metastatic. On EBUS, could not get left lung nodule bx done due to BP falling). 07/30/14 - Bronchoscopy. DIAGNOSIS: LUNG, RIGHT HILUM; EBUS-GUIDED FNA: SQUAMOUS CELL CARCINOMA. 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 PET Scan on 07/17/14 - IMPRESSION: 1. Hypermetabolic central right hilar nodule is concern for primary bronchogenic carcinoma. 2. Consolidative process peripheral to this central nodule is felt to represent postobstructive pneumonitis; however cannot exclude local tumor extension. 3. Hypermetabolic nodule associated with a central right middle lobe bronchus is concern for other foci of carcinoma. 4. Hypermetabolic nodule in the parenchyma of the right middle lobe concern for metastatic disease. 5. Hypermetabolic nodule the left upper lobe either represents a secondary primary or metastatic disease. 6. No evidence of hypermetabolic mediastinal lymph nodes or distant metastasis.    HISTORY OF PRESENT ILLNESS:  Patient returns for continued oncology followup and plan chemo for lung cancer. Serum creatinine today is better, states he is drinking better. Feels better overall. No lightheadedness when ambulating. No nausea. No diarrhea or bleeding symptoms. No fever or chills.Marland Kitchen He is agreeable to resume on chemotherapy.  REVIEW OF SYSTEMS:   ROS As in HPI above. In addition, no fevers. No new headaches or focal weakness.  No new sore throat, cough, sputum, hemoptysis or chest pain. No dizziness or palpitation. No abdominal pain, constipation, diarrhea, dysuria or hematuria.  No new paresthesias  in extremities.   PAST MEDICAL HISTORY: Past Medical History  Diagnosis Date  . Schizophrenia   . Diabetes mellitus without complication   . Hypertension   . Hyperlipemia   . Chronic fatigue   . Squamous cell carcinoma of right lung 08/22/2014          Diabetes mellitus  Hypertension  Hyperlipidemia  Hypothyroidism  Chronic fatigue  Colonoscopy >10 years ago, 2-3 small polyps removed per patient report.  PAST SURGICAL HISTORY: Past Surgical History  Procedure Laterality Date  . Colonoscopy    . Peripheral vascular catheterization N/A 08/21/2014    Procedure: Glori Luis Cath Insertion;  Surgeon: Algernon Huxley, MD;  Location: [facility];  Service: Cardiovascular;  Laterality: N/A;    FAMILY HISTORY: remarkable for stomach cancer.   SOCIAL HISTORY: History  Substance Use Topics  . Smoking status: Current Every Day Smoker -- 0.25 packs/day for 42 years    Types: Cigarettes  . Smokeless tobacco: Not on file  . Alcohol Use: 1.2 oz/week    2 Cans of beer per week    No Known Allergies  Current Outpatient Prescriptions  Medication Sig Dispense Refill  . atorvastatin (LIPITOR) 10 MG tablet Take 10 mg by mouth every morning.     Marland Kitchen levothyroxine (SYNTHROID, LEVOTHROID) 88 MCG tablet Take 88 mcg by mouth every morning.     Marland Kitchen lisinopril (PRINIVIL,ZESTRIL) 5 MG tablet Take 5 mg by mouth daily.    Marland Kitchen OLANZapine (ZYPREXA) 20 MG tablet Take 30 mg by mouth at bedtime.     . ondansetron (ZOFRAN) 4 MG tablet Take 1 tablet (4 mg total) by mouth every 6 (six) hours as needed for nausea or vomiting. 20 tablet 1  . potassium chloride SA (K-DUR,KLOR-CON)  20 MEQ tablet Take 1 tablet (20 mEq total) by mouth daily. 6 tablet 0  . prochlorperazine (COMPAZINE) 10 MG tablet Take 10 mg by mouth every 6 (six) hours as needed for nausea or vomiting.    . propranolol (INDERAL) 10 MG tablet Take 10 mg by mouth 3 (three) times daily.    . Vitamin D, Ergocalciferol, (DRISDOL) 50000 UNITS CAPS capsule Take  50,000 Units by mouth every 7 (seven) days. On Thursday    . sucralfate (CARAFATE) 1 G tablet Take 1 tablet (1 g total) by mouth 3 (three) times daily. Dissolve in 2-3 tbsp of warm water, swish and swallow. 90 tablet 3   No current facility-administered medications for this visit.   Facility-Administered Medications Ordered in Other Visits  Medication Dose Route Frequency Provider Last Rate Last Dose  . sodium chloride 0.9 % injection 10 mL  10 mL Intracatheter PRN Leia Alf, MD   10 mL at 11/11/14 0849  . sodium chloride 0.9 % injection 10 mL  10 mL Intracatheter PRN Leia Alf, MD   10 mL at 11/18/14 0820    OBJECTIVE: Filed Vitals:   11/04/14 1008  BP: 103/63  Pulse: 80  Temp: 97.3 F (36.3 C)  Resp: 18     Body mass index is 27.53 kg/(m^2).    ECOG FS:2 - Symptomatic, <50% confined to bed  GENERAL: Patient is lloking better, alert and oriented and in no acute distress. There is no icterus. HEENT: EOMs intact. Oral exam negative for thrush, mouth is moist.   CVS: S1S2, regular LUNGS: Bilaterally clear to auscultation, no rhonchi. ABDOMEN: Soft, nontender.   NEURO: grossly nonfocal, cranial nerves are intact.   EXTREMITIES: No pedal edema.  LAB RESULTS: WBC 5.2, Hb 7.7, plt 279, ANC 5.0.    Component Value Date/Time   NA 130* 11/18/2014 0813   NA 135 07/28/2014 1121   K 3.5 11/18/2014 0813   K 4.3 07/28/2014 1121   CL 97* 11/18/2014 0813   CL 101 07/28/2014 1121   CO2 26 11/18/2014 0813   CO2 25 07/28/2014 1121   GLUCOSE 119* 11/18/2014 0813   GLUCOSE 135* 07/28/2014 1121   BUN 10 11/18/2014 0813   BUN 19 07/28/2014 1121   CREATININE 1.17 11/18/2014 0813   CREATININE 1.36* 07/28/2014 1121   CALCIUM 8.1* 11/18/2014 0813   CALCIUM 9.1 07/28/2014 1121   PROT 6.1* 11/18/2014 0813   PROT 7.6 07/23/2014 1042   ALBUMIN 2.7* 11/18/2014 0813   ALBUMIN 4.2 07/23/2014 1042   AST 22 11/18/2014 0813   AST 24 07/23/2014 1042   ALT 17 11/18/2014 0813   ALT 24  07/23/2014 1042   ALKPHOS 73 11/18/2014 0813   ALKPHOS 108 07/23/2014 1042   BILITOT 0.4 11/18/2014 0813   BILITOT 0.5 07/23/2014 1042   GFRNONAA >60 11/18/2014 0813   GFRNONAA 57* 07/28/2014 1121   GFRAA >60 11/18/2014 0813   GFRAA >60 07/28/2014 1121         Chemistry        STUDIES: 07/02/14 - CT scan of the chest with contrast - right upper lobe nodule with suggestion of endobronchial component (apical segment), concerning for primary malignancy. Opacities elsewhere in the right upper lobe could represent postobstructive atelectasis, obstructive pneumonia, or endobronchial spread of tumor. Left upper lobe spiculated mass 1.7 cm, favor a second primary, though metastasis also a consideration. No lymphadenopathy by size criteria. No appreciable distant metastasis. PET/CT could be considered for further staging evaluation. COPD/emphysema.  07/17/14 -  PET scan.  IMPRESSION: 1. Hypermetabolic central right hilar nodule is concern for primary bronchogenic carcinoma. 2. Consolidative process peripheral to this central nodule is felt to represent postobstructive pneumonitis; however cannot exclude local tumor extension. 3. Hypermetabolic nodule associated with a central right middle lobe bronchus is concern for other foci of carcinoma. 4. Hypermetabolic nodule in the parenchyma of the right middle lobe concern for metastatic disease. 5. Hypermetabolic nodule the left upper lobe either represents a secondary primary or metastatic disease. 6. No evidence of hypermetabolic mediastinal lymph nodes or distant metastasis.  07/30/14 - Bronchoscopy. DIAGNOSIS: LUNG, RIGHT HILUM; EBUS-GUIDED FNA: SQUAMOUS CELL CARCINOMA. 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   ASSESSMENT / PLAN:   1. Right non-small cell right upper lobe lung cancer (Squamous), at least stage IIIB (possibly stage IV if left lung nodule is metastatic. On EBUS, could not get left lung nodule bx done due to  BP falling). On 07/30/14,  Bronchoscopy/EBUS. DIAGNOSIS: LUNG, RIGHT HILUM; EBUS-GUIDED FNA: SQUAMOUS CELL CARCINOMA. -  Have reviewed labs and discussed with patient. Serum creatinine is better at 1.06, he is doing better. He is eating and drinking better. Plan is to resume oon taxol/carbo chemo at same dosage and monitor for side effects, he is agreeable to this. Lab at 1 week and 2 weeks and next 2 doses of chemo (taxol/carbo). Next MD f/u at 4 weeks with lab and chemo (taxol/carbo)..  2. Anemia-likely secondary to chemotherapy effect. We will continue to monitor and transfuse as indicated.   3. In between visits, patient advised to call or come to ER in case of any new symptoms or acute sickness. He is agreeable to this plan.    Leia Alf, MD   11/22/2014 11:39 AM

## 2014-11-25 ENCOUNTER — Inpatient Hospital Stay (HOSPITAL_BASED_OUTPATIENT_CLINIC_OR_DEPARTMENT_OTHER): Payer: Medicare Other | Admitting: Internal Medicine

## 2014-11-25 VITALS — BP 97/66 | HR 79 | Temp 96.8°F | Resp 18 | Ht 72.0 in | Wt 194.2 lb

## 2014-11-25 DIAGNOSIS — F209 Schizophrenia, unspecified: Secondary | ICD-10-CM

## 2014-11-25 DIAGNOSIS — R5382 Chronic fatigue, unspecified: Secondary | ICD-10-CM

## 2014-11-25 DIAGNOSIS — C3411 Malignant neoplasm of upper lobe, right bronchus or lung: Secondary | ICD-10-CM

## 2014-11-25 DIAGNOSIS — C3491 Malignant neoplasm of unspecified part of right bronchus or lung: Secondary | ICD-10-CM

## 2014-11-25 DIAGNOSIS — R918 Other nonspecific abnormal finding of lung field: Secondary | ICD-10-CM

## 2014-11-25 DIAGNOSIS — Z923 Personal history of irradiation: Secondary | ICD-10-CM | POA: Diagnosis not present

## 2014-11-25 DIAGNOSIS — Z9221 Personal history of antineoplastic chemotherapy: Secondary | ICD-10-CM | POA: Diagnosis not present

## 2014-11-25 DIAGNOSIS — I10 Essential (primary) hypertension: Secondary | ICD-10-CM

## 2014-11-25 DIAGNOSIS — R630 Anorexia: Secondary | ICD-10-CM

## 2014-11-25 DIAGNOSIS — D649 Anemia, unspecified: Secondary | ICD-10-CM

## 2014-11-25 DIAGNOSIS — E785 Hyperlipidemia, unspecified: Secondary | ICD-10-CM

## 2014-11-25 DIAGNOSIS — E119 Type 2 diabetes mellitus without complications: Secondary | ICD-10-CM

## 2014-11-25 DIAGNOSIS — Z79899 Other long term (current) drug therapy: Secondary | ICD-10-CM

## 2014-11-25 NOTE — Progress Notes (Signed)
Patient is here for follow-up of lung cancer. He did not receive chemo last week because he had a reaction to it the treatment before and Dr. Grayland Ormond wanted to hold the treatment and for him to see Dr. Ma Hillock. Patient's caregiver states that patient gets nauseated when eating bread, so they took the bread away from him. She states that patient's appetite has not been that great.

## 2014-11-27 ENCOUNTER — Ambulatory Visit
Admission: RE | Admit: 2014-11-27 | Discharge: 2014-11-27 | Disposition: A | Discharge status: Home / Self Care | Payer: Medicare Other | Source: Ambulatory Visit | Attending: Internal Medicine | Admitting: Internal Medicine

## 2014-11-27 DIAGNOSIS — I2510 Atherosclerotic heart disease of native coronary artery without angina pectoris: Secondary | ICD-10-CM | POA: Insufficient documentation

## 2014-11-27 DIAGNOSIS — C3491 Malignant neoplasm of unspecified part of right bronchus or lung: Secondary | ICD-10-CM

## 2014-11-27 DIAGNOSIS — Z08 Encounter for follow-up examination after completed treatment for malignant neoplasm: Secondary | ICD-10-CM | POA: Insufficient documentation

## 2014-11-27 DIAGNOSIS — K449 Diaphragmatic hernia without obstruction or gangrene: Secondary | ICD-10-CM | POA: Insufficient documentation

## 2014-11-27 DIAGNOSIS — Z85118 Personal history of other malignant neoplasm of bronchus and lung: Secondary | ICD-10-CM | POA: Insufficient documentation

## 2014-11-27 MED ORDER — IOHEXOL 300 MG/ML  SOLN
75.0000 mL | Freq: Once | INTRAMUSCULAR | Status: AC | PRN
  Administered 2014-11-27: 75 mL via INTRAVENOUS

## 2014-12-02 ENCOUNTER — Inpatient Hospital Stay (HOSPITAL_BASED_OUTPATIENT_CLINIC_OR_DEPARTMENT_OTHER): Payer: Medicare Other | Admitting: Internal Medicine

## 2014-12-02 ENCOUNTER — Inpatient Hospital Stay: Payer: Medicare Other

## 2014-12-02 ENCOUNTER — Encounter: Payer: Self-pay | Admitting: Radiation Oncology

## 2014-12-02 ENCOUNTER — Ambulatory Visit
Admission: RE | Admit: 2014-12-02 | Discharge: 2014-12-02 | Disposition: A | Discharge status: Home / Self Care | Payer: Medicare Other | Source: Ambulatory Visit | Attending: Radiation Oncology | Admitting: Radiation Oncology

## 2014-12-02 VITALS — BP 90/61 | HR 77 | Temp 95.5°F | Resp 20 | Wt 194.3 lb

## 2014-12-02 VITALS — BP 91/64 | HR 74 | Temp 96.3°F | Resp 18 | Ht 72.0 in | Wt 194.4 lb

## 2014-12-02 DIAGNOSIS — Z9221 Personal history of antineoplastic chemotherapy: Secondary | ICD-10-CM

## 2014-12-02 DIAGNOSIS — R630 Anorexia: Secondary | ICD-10-CM

## 2014-12-02 DIAGNOSIS — C3411 Malignant neoplasm of upper lobe, right bronchus or lung: Secondary | ICD-10-CM

## 2014-12-02 DIAGNOSIS — Z923 Personal history of irradiation: Secondary | ICD-10-CM | POA: Diagnosis not present

## 2014-12-02 DIAGNOSIS — C3491 Malignant neoplasm of unspecified part of right bronchus or lung: Secondary | ICD-10-CM

## 2014-12-02 DIAGNOSIS — E119 Type 2 diabetes mellitus without complications: Secondary | ICD-10-CM

## 2014-12-02 DIAGNOSIS — I10 Essential (primary) hypertension: Secondary | ICD-10-CM

## 2014-12-02 DIAGNOSIS — D649 Anemia, unspecified: Secondary | ICD-10-CM

## 2014-12-02 DIAGNOSIS — E785 Hyperlipidemia, unspecified: Secondary | ICD-10-CM

## 2014-12-02 DIAGNOSIS — Z79899 Other long term (current) drug therapy: Secondary | ICD-10-CM

## 2014-12-02 DIAGNOSIS — F209 Schizophrenia, unspecified: Secondary | ICD-10-CM

## 2014-12-02 DIAGNOSIS — R918 Other nonspecific abnormal finding of lung field: Secondary | ICD-10-CM

## 2014-12-02 DIAGNOSIS — R5382 Chronic fatigue, unspecified: Secondary | ICD-10-CM

## 2014-12-02 LAB — CBC WITH DIFFERENTIAL/PLATELET
Basophils Absolute: 0 10*3/uL (ref 0–0.1)
Basophils Relative: 0 %
Eosinophils Absolute: 0 10*3/uL (ref 0–0.7)
Eosinophils Relative: 1 %
HCT: 26.2 % — ABNORMAL LOW (ref 40.0–52.0)
HEMOGLOBIN: 8.8 g/dL — AB (ref 13.0–18.0)
LYMPHS ABS: 0.9 10*3/uL — AB (ref 1.0–3.6)
LYMPHS PCT: 12 %
MCH: 31.5 pg (ref 26.0–34.0)
MCHC: 33.4 g/dL (ref 32.0–36.0)
MCV: 94.3 fL (ref 80.0–100.0)
Monocytes Absolute: 0.7 10*3/uL (ref 0.2–1.0)
Monocytes Relative: 10 %
NEUTROS PCT: 77 %
Neutro Abs: 5.4 10*3/uL (ref 1.4–6.5)
Platelets: 313 10*3/uL (ref 150–440)
RBC: 2.78 MIL/uL — AB (ref 4.40–5.90)
RDW: 17.2 % — ABNORMAL HIGH (ref 11.5–14.5)
WBC: 7.1 10*3/uL (ref 3.8–10.6)

## 2014-12-02 LAB — BASIC METABOLIC PANEL
ANION GAP: 6 (ref 5–15)
BUN: 11 mg/dL (ref 6–20)
CHLORIDE: 99 mmol/L — AB (ref 101–111)
CO2: 26 mmol/L (ref 22–32)
Calcium: 8.4 mg/dL — ABNORMAL LOW (ref 8.9–10.3)
Creatinine, Ser: 1.21 mg/dL (ref 0.61–1.24)
GFR calc Af Amer: >60 mL/min (ref >60)
GFR calc non Af Amer: >60 mL/min (ref >60)
GLUCOSE: 111 mg/dL — AB (ref 65–99)
POTASSIUM: 4.3 mmol/L (ref 3.5–5.1)
Sodium: 131 mmol/L — ABNORMAL LOW (ref 135–145)

## 2014-12-02 MED ORDER — HEPARIN SOD (PORK) LOCK FLUSH 100 UNIT/ML IV SOLN
500.0000 [IU] | Freq: Once | INTRAVENOUS | Status: AC
  Administered 2014-12-02: 500 [IU] via INTRAVENOUS

## 2014-12-02 MED ORDER — SODIUM CHLORIDE 0.9 % IJ SOLN
10.0000 mL | Freq: Once | INTRAMUSCULAR | Status: AC
  Administered 2014-12-02: 10 mL via INTRAVENOUS
  Filled 2014-12-02: qty 10

## 2014-12-02 MED ORDER — MEGESTROL ACETATE 625 MG/5ML PO SUSP: 625.0000 mg | Freq: Every day | ORAL | Status: DC

## 2014-12-02 MED ORDER — HEPARIN SOD (PORK) LOCK FLUSH 100 UNIT/ML IV SOLN
INTRAVENOUS | Status: AC
  Filled 2014-12-02: qty 5

## 2014-12-02 NOTE — Progress Notes (Signed)
Radiation Oncology Follow up Note  Name: Travis Maddox   Date:   12/02/2014 MRN:  [id number] DOB: February 22, 1956    This 59 y.o. male presents to the clinic today for follow-up for IIIa lung cancer.  REFERRING PROVIDER: [name], MD  HPI: Patient is a 59 year old male now 1 month out having completed radiation therapy for central right hilar mass positive for non-small cell carcinoma squamous cell carcinoma.Marland Kitchen He is seen today in routine follow-up and is doing well. Has lost a little weight. He is having no dysphagia. Patient has severe cognitive dysfunction and is living in a group home. He had a CT scan last week showing mild residual patchy opacity in the posterior right upper lobe with radiation changes. Also right perihilar and posterior left upper lobe nodules have resolved. I reviewed those films showing excellent response to therapy.  COMPLICATIONS OF TREATMENT: none  FOLLOW UP COMPLIANCE: keeps appointments   PHYSICAL EXAM:  BP 90/61 mmHg  Pulse 77  Temp(Src) 95.5 F (35.3 C)  Resp 20  Wt 194 lb 5.4 oz (88.15 kg) Well-developed well-nourished patient in NAD. HEENT reveals PERLA, EOMI, discs not visualized.  Oral cavity is clear. No oral mucosal lesions are identified. Neck is clear without evidence of cervical or supraclavicular adenopathy. Lungs are clear to A&P. Cardiac examination is essentially unremarkable with regular rate and rhythm without murmur rub or thrill. Abdomen is benign with no organomegaly or masses noted. Motor sensory and DTR levels are equal and symmetric in the upper and lower extremities. Cranial nerves II through XII are grossly intact. Proprioception is intact. No peripheral adenopathy or edema is identified. No motor or sensory levels are noted. Crude visual fields are within normal range.   RADIOLOGY RESULTS: Recent CT scan is reviewed and compared to prior films  PLAN: At the present time he continues to do well now 1 month out of radiation therapy.  I'm glad the other nodules have resolved. I'm please was overall performance I've encouraged him to increase his nutritional intake. He continues close follow-up care with medical oncology. I have asked to see him back in 4-5 months for follow-up. Group home knows to call with any concerns.  I would like to take this opportunity for allowing me to participate in the care of your patient.Armstead Peaks., MD

## 2014-12-07 NOTE — Progress Notes (Signed)
Travis Maddox  [phone] [phone]) [phone]     ID: Travis Maddox OB: Dec 26, 1955  MR#: [id number]  UKG#:[id number]  Patient Care Team: [name], MD as PCP - General (Internal Medicine)  CHIEF COMPLAINT/DIAGNOSIS:  Right non-small cell right upper lobe lung cancer (Squamous), at least stage IIIB (possibly stage IV if left lung nodule is metastatic. On EBUS, could not get left lung nodule bx done due to BP falling). 07/30/14 - Bronchoscopy. DIAGNOSIS: LUNG, RIGHT HILUM; EBUS-GUIDED FNA: SQUAMOUS CELL CARCINOMA. 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 PET Scan on 07/17/14 - IMPRESSION: 1. Hypermetabolic central right hilar nodule is concern for primary bronchogenic carcinoma. 2. Consolidative process peripheral to this central nodule is felt to represent postobstructive pneumonitis; however cannot exclude local tumor extension. 3. Hypermetabolic nodule associated with a central right middle lobe bronchus is concern for other foci of carcinoma. 4. Hypermetabolic nodule in the parenchyma of the right middle lobe concern for metastatic disease. 5. Hypermetabolic nodule the left upper lobe either represents a secondary primary or metastatic disease. 6. No evidence of hypermetabolic mediastinal lymph nodes or distant metastasis.    HISTORY OF PRESENT ILLNESS:  Patient returns for continued oncology followup and plan chemotherapy. States he is constantly fatigued from chemo, tries to stay active. No lightheadedness when ambulating. No nausea. No diarrhea or bleeding symptoms. No fever or chills. Patient and caretaker present feels he cannot tolerate more chemo at this time. He completed XRT few weeks ago.  REVIEW OF SYSTEMS:   ROS As in HPI above. In addition, no fevers or chills. No new headaches or focal weakness.  No new sore throat, cough, sputum, hemoptysis or chest pain. No dizziness or palpitation. No abdominal pain, constipation,  diarrhea, dysuria or hematuria.  No new paresthesias in extremities.   PAST MEDICAL HISTORY: Past Medical History  Diagnosis Date  . Schizophrenia   . Diabetes mellitus without complication   . Hypertension   . Hyperlipemia   . Chronic fatigue   . Squamous cell carcinoma of right lung 08/22/2014          Diabetes mellitus  Hypertension  Hyperlipidemia  Hypothyroidism  Chronic fatigue  Colonoscopy >10 years ago, 2-3 small polyps removed per patient report.  PAST SURGICAL HISTORY: Past Surgical History  Procedure Laterality Date  . Colonoscopy    . Peripheral vascular catheterization N/A 08/21/2014    Procedure: Glori Luis Cath Insertion;  Surgeon: Algernon Huxley, MD;  Location: [facility];  Service: Cardiovascular;  Laterality: N/A;    FAMILY HISTORY: remarkable for stomach cancer.   SOCIAL HISTORY: Social History  Substance Use Topics  . Smoking status: Current Every Day Smoker -- 0.25 packs/day for 42 years    Types: Cigarettes  . Smokeless tobacco: Not on file  . Alcohol Use: 1.2 oz/week    2 Cans of beer per week    No Known Allergies  Current Outpatient Prescriptions  Medication Sig Dispense Refill  . atorvastatin (LIPITOR) 10 MG tablet Take 10 mg by mouth every morning.     Marland Kitchen levothyroxine (SYNTHROID, LEVOTHROID) 88 MCG tablet Take 88 mcg by mouth every morning.     Marland Kitchen lisinopril (PRINIVIL,ZESTRIL) 5 MG tablet Take 5 mg by mouth daily.    Marland Kitchen OLANZapine (ZYPREXA) 20 MG tablet Take 30 mg by mouth at bedtime.     . ondansetron (ZOFRAN) 4 MG tablet Take 1 tablet (4 mg total) by mouth every 6 (six) hours as needed for nausea or  vomiting. 20 tablet 1  . potassium chloride SA (K-DUR,KLOR-CON) 20 MEQ tablet Take 1 tablet (20 mEq total) by mouth daily. 6 tablet 0  . prochlorperazine (COMPAZINE) 10 MG tablet Take 10 mg by mouth every 6 (six) hours as needed for nausea or vomiting.    . propranolol (INDERAL) 10 MG tablet Take 10 mg by mouth 3 (three) times daily.    .  sucralfate (CARAFATE) 1 G tablet Take 1 tablet (1 g total) by mouth 3 (three) times daily. Dissolve in 2-3 tbsp of warm water, swish and swallow. 90 tablet 3  . Vitamin D, Ergocalciferol, (DRISDOL) 50000 UNITS CAPS capsule Take 50,000 Units by mouth every 7 (seven) days. On Thursday    . megestrol (MEGACE ES) 625 MG/5ML suspension Take 5 mLs (625 mg total) by mouth daily. 150 mL 0   No current facility-administered medications for this visit.   Facility-Administered Medications Ordered in Other Visits  Medication Dose Route Frequency Provider Last Rate Last Dose  . sodium chloride 0.9 % injection 10 mL  10 mL Intracatheter PRN Leia Alf, MD   10 mL at 11/11/14 0849  . sodium chloride 0.9 % injection 10 mL  10 mL Intracatheter PRN Leia Alf, MD   10 mL at 11/18/14 0820    OBJECTIVE: Filed Vitals:   11/25/14 0855  BP: 97/66  Pulse:   Temp:   Resp:      Body mass index is 26.34 kg/(m^2).    ECOG FS:2 - Symptomatic, <50% confined to bed  GENERAL: Tired-looking, otherwise alert and oriented and in no acute distress. No icterus. HEENT: EOMs intact. Oral exam negative for thrush, mouth is moist.   CVS: S1S2, regular LUNGS: Bilaterally clear to auscultation, no rhonchi. ABDOMEN: Soft, nontender.   EXTREMITIES: No pedal edema.  LAB RESULTS: 11/18/14 - WBC 5.2, Hb 7.7, plt 279, ANC 5.0, Cr 1.17.        Chemistry        STUDIES: 07/02/14 - CT scan of the chest with contrast - right upper lobe nodule with suggestion of endobronchial component (apical segment), concerning for primary malignancy. Opacities elsewhere in the right upper lobe could represent postobstructive atelectasis, obstructive pneumonia, or endobronchial spread of tumor. Left upper lobe spiculated mass 1.7 cm, favor a second primary, though metastasis also a consideration. No lymphadenopathy by size criteria. No appreciable distant metastasis. PET/CT could be considered for further staging evaluation.  COPD/emphysema.  07/17/14 - PET scan.  IMPRESSION: 1. Hypermetabolic central right hilar nodule is concern for primary bronchogenic carcinoma. 2. Consolidative process peripheral to this central nodule is felt to represent postobstructive pneumonitis; however cannot exclude local tumor extension. 3. Hypermetabolic nodule associated with a central right middle lobe bronchus is concern for other foci of carcinoma. 4. Hypermetabolic nodule in the parenchyma of the right middle lobe concern for metastatic disease. 5. Hypermetabolic nodule the left upper lobe either represents a secondary primary or metastatic disease. 6. No evidence of hypermetabolic mediastinal lymph nodes or distant metastasis.  07/30/14 - Bronchoscopy. DIAGNOSIS: LUNG, RIGHT HILUM; EBUS-GUIDED FNA: SQUAMOUS CELL CARCINOMA. 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   ASSESSMENT / PLAN:   1. Right non-small cell right upper lobe lung cancer (Squamous), at least stage IIIB (possibly stage IV if left lung nodule is metastatic. On EBUS, could not get left lung nodule bx done due to BP falling). On 07/30/14,  Bronchoscopy/EBUS. DIAGNOSIS: LUNG, RIGHT HILUM; EBUS-GUIDED FNA: SQUAMOUS CELL CARCINOMA. -  Reviewed recent labs and discussed  with patient. He is feeling more fatigued and having difficulty tolerating chemo, have discussed getting f/u CT scan and then make further treatment plan based upon response to treatment. Have advised him to try eating and drinking better. Will f/u in 1 week and make further plan of management.  2. Anemia-likely secondary to chemotherapy effect. We will continue to monitor and transfuse as indicated.   3. In between visits, patient advised to call or come to ER in case of any new symptoms or acute sickness. He is agreeable to this plan.    Leia Alf, MD   12/07/2014 10:17 AM

## 2014-12-18 ENCOUNTER — Telehealth: Payer: Self-pay | Admitting: Internal Medicine

## 2014-12-18 MED ORDER — NYSTATIN 100000 UNIT/ML MT SUSP: OROMUCOSAL | Status: DC

## 2014-12-18 MED ORDER — ONDANSETRON 4 MG PO FILM: 1.0000 | ORAL_FILM | ORAL | Status: DC | PRN

## 2014-12-18 NOTE — Telephone Encounter (Signed)
Per Vicente Serene the caregiver would like to know if Dr. Ma Hillock will call in something for pt. Pt unable to swallow and can;t keep anything down.  [phone] cell or [phone]

## 2014-12-18 NOTE — Telephone Encounter (Signed)
Called Travis Maddox and spoke with him and asked about the swallowing issues.  He states he drinks liquids and soft foods ok.  When he eats certain things like meat and certain veg. And bread he acts like it is hard to swallow and he vomits up some food at times and when you ask him if he is sick on stomach -nauseated he says yes.  He is taking the carafate slurry and he is taking compazine but nothing helping.  Spoke to Dr. Ma Hillock and he states to try the ODt zofran and the nystatin liq.  Both meds have been entered into computer and called Travis Maddox to let him know.  If pt no better over next few days to cal [place] back.

## 2014-12-18 NOTE — Addendum Note (Signed)
Addended by: Luella Cook on: 12/18/2014 02:17 PM   Modules accepted: Orders

## 2014-12-19 NOTE — Progress Notes (Signed)
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 On EBUS, could not get left lung nodule bx done due to BP falling). 07/30/14 - Bronchoscopy. DIAGNOSIS: LUNG, RIGHT HILUM; EBUS-GUIDED FNA: SQUAMOUS CELL CARCINOMA. 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 PET Scan on 07/17/14 - IMPRESSION: 1. Hypermetabolic central right hilar nodule is concern for primary bronchogenic carcinoma. 2. Consolidative process peripheral to this central nodule is felt to represent postobstructive pneumonitis; however cannot exclude local tumor extension. 3. Hypermetabolic nodule associated with a central right middle lobe bronchus is concern for other foci of carcinoma. 4. Hypermetabolic nodule in the parenchyma of the right middle lobe concern for metastatic disease. 5. Hypermetabolic nodule the left upper lobe either represents a secondary primary or metastatic disease. 6. No evidence of hypermetabolic mediastinal lymph nodes or distant metastasis.    HISTORY OF PRESENT ILLNESS:  Patient here for follow-up and plan next dose of chemotherapy. States that he is feeling more fatigued than usual, caretaker pleasant with him states that he is not eating well, appetite is down. Otherwise no difficulty in swallowing or sore throat. No nausea vomiting or diarrhea.  No lightheadedness when ambulating. No fever or chills. He completed XRT few weeks ago.  REVIEW OF SYSTEMS:   ROS As in HPI above. In addition, no new headaches or focal weakness.  No new sore throat, cough, sputum, hemoptysis or chest pain. No dizziness or palpitation. No abdominal pain, constipation,  diarrhea, dysuria or hematuria.  No new paresthesias in extremities.   PAST MEDICAL HISTORY: Past Medical History  Diagnosis Date  . Schizophrenia   . Diabetes mellitus without complication   . Hypertension   . Hyperlipemia   . Chronic fatigue   . Squamous cell carcinoma of right lung 08/22/2014          Diabetes mellitus  Hypertension  Hyperlipidemia  Hypothyroidism  Chronic fatigue  Colonoscopy >10 years ago, 2-3 small polyps removed per patient report.  PAST SURGICAL HISTORY: Past Surgical History  Procedure Laterality Date  . Colonoscopy    . Peripheral vascular catheterization N/A 08/21/2014    Procedure: Glori Luis Cath Insertion;  Surgeon: Algernon Huxley, MD;  Location: [facility];  Service: Cardiovascular;  Laterality: N/A;    FAMILY HISTORY: remarkable for stomach cancer.   SOCIAL HISTORY: Social History  Substance Use Topics  . Smoking status: Current Every Day Smoker -- 0.25 packs/day for 42 years    Types: Cigarettes  . Smokeless tobacco: Not on file  . Alcohol Use: 1.2 oz/week    2 Cans of beer per week    No Known Allergies  Current Outpatient Prescriptions  Medication Sig Dispense Refill  . atorvastatin (LIPITOR) 10 MG tablet Take 10 mg by mouth every morning.     Marland Kitchen levothyroxine (SYNTHROID, LEVOTHROID) 88 MCG tablet Take 88 mcg by mouth every morning.     Marland Kitchen lisinopril (PRINIVIL,ZESTRIL) 5 MG tablet Take 5 mg by mouth daily.    Marland Kitchen OLANZapine (ZYPREXA) 20 MG tablet Take 30 mg by mouth at bedtime.     . ondansetron (ZOFRAN) 4 MG tablet Take 1 tablet (4 mg total) by mouth every 6 (six) hours as needed  for nausea or vomiting. 20 tablet 1  . potassium chloride SA (K-DUR,KLOR-CON) 20 MEQ tablet Take 1 tablet (20 mEq total) by mouth daily. 6 tablet 0  . prochlorperazine (COMPAZINE) 10 MG tablet Take 10 mg by mouth every 6 (six) hours as needed for nausea or vomiting.    . propranolol (INDERAL) 10 MG tablet Take 10 mg by mouth 3 (three) times daily.    .  sucralfate (CARAFATE) 1 G tablet Take 1 tablet (1 g total) by mouth 3 (three) times daily. Dissolve in 2-3 tbsp of warm water, swish and swallow. 90 tablet 3  . Vitamin D, Ergocalciferol, (DRISDOL) 50000 UNITS CAPS capsule Take 50,000 Units by mouth every 7 (seven) days. On Thursday    . megestrol (MEGACE ES) 625 MG/5ML suspension Take 5 mLs (625 mg total) by mouth daily. 150 mL 0  . nystatin (MYCOSTATIN) 100000 UNIT/ML suspension Take 5 ml up to 4 times a day as needed for discomfort in throat. ( swish and swallow). 480 mL 1  . Ondansetron 4 MG FILM Take 1 each by mouth every 4 (four) hours as needed. 40 each 1   No current facility-administered medications for this visit.   Facility-Administered Medications Ordered in Other Visits  Medication Dose Route Frequency Provider Last Rate Last Dose  . sodium chloride 0.9 % injection 10 mL  10 mL Intracatheter PRN Leia Alf, MD   10 mL at 11/11/14 0849  . sodium chloride 0.9 % injection 10 mL  10 mL Intracatheter PRN Leia Alf, MD   10 mL at 11/18/14 0820    OBJECTIVE: Filed Vitals:   12/02/14 0943  BP: 91/64  Pulse: 74  Temp: 96.3 F (35.7 C)  Resp: 18     Body mass index is 26.37 kg/(m^2).    ECOG FS:2 - Symptomatic, <50% confined to bed  GENERAL: Patient is chronically tired-looking, otherwise alert and oriented and in no acute distress. No icterus. HEENT: EOMs intact. Oral exam negative for thrush, mouth is moist.   CVS: S1S2, regular LUNGS: Bilaterally clear to auscultation, no rhonchi. ABDOMEN: Soft, nontender.   EXTREMITIES: No pedal edema.  LAB RESULTS: Potassium 4.3 creatinine 1.21 hemoglobin 8.8 WBC 7.1 platelets 213          STUDIES: 07/02/14 - CT scan of the chest with contrast - right upper lobe nodule with suggestion of endobronchial component (apical segment), concerning for primary malignancy. Opacities elsewhere in the right upper lobe could represent postobstructive atelectasis, obstructive pneumonia, or  endobronchial spread of tumor. Left upper lobe spiculated mass 1.7 cm, favor a second primary, though metastasis also a consideration. No lymphadenopathy by size criteria. No appreciable distant metastasis. PET/CT could be considered for further staging evaluation. COPD/emphysema.  07/17/14 - PET scan.  IMPRESSION: 1. Hypermetabolic central right hilar nodule is concern for primary bronchogenic carcinoma. 2. Consolidative process peripheral to this central nodule is felt to represent postobstructive pneumonitis; however cannot exclude local tumor extension. 3. Hypermetabolic nodule associated with a central right middle lobe bronchus is concern for other foci of carcinoma. 4. Hypermetabolic nodule in the parenchyma of the right middle lobe concern for metastatic disease. 5. Hypermetabolic nodule the left upper lobe either represents a secondary primary or metastatic disease. 6. No evidence of hypermetabolic mediastinal lymph nodes or distant metastasis.  07/30/14 - Bronchoscopy. DIAGNOSIS: LUNG, RIGHT HILUM; EBUS-GUIDED FNA: SQUAMOUS CELL CARCINOMA. 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  11/27/14 - CT scan of the chest with contrast. IMPRESSION:  Mild residual  focal/patchy opacity in the posterior right upper lobe measures 0.9 x 2.9 cm with surrounding inflammatory/radiation changes, improved. Additional right perihilar and posterior left upper lobe nodules have resolved. 10 mm short axis right hilar nodal metastasis, decreased   ASSESSMENT / PLAN:   1. Right non-small cell right upper lobe lung cancer (Squamous), at least stage IIIB (possibly stage IV if left lung nodule is metastatic. On EBUS, could not get left lung nodule bx done due to BP falling). On 07/30/14,  Bronchoscopy/EBUS. DIAGNOSIS: LUNG, RIGHT HILUM; EBUS-GUIDED FNA: SQUAMOUS CELL CARCINOMA. -  Reviewed labs and discussed with patient. Review of CT scan and discussed, explained that there is partial response to  chemotherapy. Appetite is markedly decreased, otherwise seems to be doing fairly steady. Patient and caretaker present feels that he cannot tolerate more chemotherapy at this time, have discussed that there is a high risk of recurrence in the future but plan at this time is to hold off on further chemotherapy and monitor. Will add Megace liquid 800 mg daily for appetite stimulation and continue to monitor weight record when he comes for scheduled appointments. Will get repeat labs in 12 weeks and schedule surveillance CT scan of the chest, and see him back to make further plan of management.   2. Anemia-likely secondary to chemotherapy effect. Will continue to monitor and transfuse as indicated.   3. Continue port flush every 6 weeks. 4. In between visits, patient advised to call or come to ER in case of any new symptoms or acute sickness. He is agreeable to this plan.    Leia Alf, MD   12/19/2014 8:39 AM

## 2014-12-29 ENCOUNTER — Encounter: Payer: Self-pay | Admitting: *Deleted

## 2014-12-29 NOTE — Progress Notes (Signed)
Patient is taking a break from treatment.  Survivorship Care Plan has been completed and mailed to patient.

## 2015-01-05 ENCOUNTER — Telehealth: Payer: Self-pay | Admitting: *Deleted

## 2015-01-05 NOTE — Telephone Encounter (Signed)
-----   Message from Cephus Richer sent at 01/05/2015 10:16 AM EDT ----- Regarding: Glenda  Contact: [phone] Pt is still not feeling good.  His stomach is still upset. Can someone call Glenda at [phone] or Vicente Serene at [phone].

## 2015-01-05 NOTE — Telephone Encounter (Signed)
Advised to use nausea med as needed and to try giving med 30 mins before meals to see if that helps and to call us back if not improved

## 2015-01-13 ENCOUNTER — Inpatient Hospital Stay: Payer: Medicare Other | Attending: Internal Medicine

## 2015-01-13 DIAGNOSIS — Z9221 Personal history of antineoplastic chemotherapy: Secondary | ICD-10-CM | POA: Diagnosis not present

## 2015-01-13 DIAGNOSIS — C801 Malignant (primary) neoplasm, unspecified: Secondary | ICD-10-CM

## 2015-01-13 DIAGNOSIS — Z923 Personal history of irradiation: Secondary | ICD-10-CM | POA: Insufficient documentation

## 2015-01-13 DIAGNOSIS — C3411 Malignant neoplasm of upper lobe, right bronchus or lung: Secondary | ICD-10-CM | POA: Insufficient documentation

## 2015-01-13 DIAGNOSIS — Z452 Encounter for adjustment and management of vascular access device: Secondary | ICD-10-CM | POA: Insufficient documentation

## 2015-01-13 MED ORDER — SODIUM CHLORIDE 0.9 % IJ SOLN
10.0000 mL | Freq: Once | INTRAMUSCULAR | Status: AC
  Administered 2015-01-13: 10 mL via INTRAVENOUS
  Filled 2015-01-13: qty 10

## 2015-01-13 MED ORDER — HEPARIN SOD (PORK) LOCK FLUSH 100 UNIT/ML IV SOLN
500.0000 [IU] | Freq: Once | INTRAVENOUS | Status: AC
  Administered 2015-01-13: 500 [IU] via INTRAVENOUS
  Filled 2015-01-13: qty 5

## 2015-01-23 ENCOUNTER — Other Ambulatory Visit: Payer: Self-pay | Admitting: Radiation Oncology

## 2015-02-24 ENCOUNTER — Ambulatory Visit: Payer: Medicare Other | Admitting: Internal Medicine

## 2015-02-24 ENCOUNTER — Inpatient Hospital Stay: Payer: Medicare Other

## 2015-02-24 ENCOUNTER — Ambulatory Visit
Admission: RE | Admit: 2015-02-24 | Discharge: 2015-02-24 | Disposition: A | Discharge status: Home / Self Care | Payer: Medicare Other | Source: Ambulatory Visit | Attending: Internal Medicine | Admitting: Internal Medicine

## 2015-02-24 DIAGNOSIS — I313 Pericardial effusion (noninflammatory): Secondary | ICD-10-CM | POA: Insufficient documentation

## 2015-02-24 DIAGNOSIS — I7090 Unspecified atherosclerosis: Secondary | ICD-10-CM | POA: Diagnosis not present

## 2015-02-24 DIAGNOSIS — J90 Pleural effusion, not elsewhere classified: Secondary | ICD-10-CM | POA: Insufficient documentation

## 2015-02-24 DIAGNOSIS — K222 Esophageal obstruction: Secondary | ICD-10-CM

## 2015-02-24 DIAGNOSIS — E785 Hyperlipidemia, unspecified: Secondary | ICD-10-CM

## 2015-02-24 DIAGNOSIS — Z08 Encounter for follow-up examination after completed treatment for malignant neoplasm: Secondary | ICD-10-CM | POA: Diagnosis not present

## 2015-02-24 DIAGNOSIS — R911 Solitary pulmonary nodule: Secondary | ICD-10-CM | POA: Insufficient documentation

## 2015-02-24 DIAGNOSIS — R634 Abnormal weight loss: Secondary | ICD-10-CM

## 2015-02-24 DIAGNOSIS — K449 Diaphragmatic hernia without obstruction or gangrene: Secondary | ICD-10-CM | POA: Insufficient documentation

## 2015-02-24 DIAGNOSIS — I10 Essential (primary) hypertension: Secondary | ICD-10-CM

## 2015-02-24 DIAGNOSIS — R1319 Other dysphagia: Secondary | ICD-10-CM | POA: Insufficient documentation

## 2015-02-24 DIAGNOSIS — F209 Schizophrenia, unspecified: Secondary | ICD-10-CM | POA: Insufficient documentation

## 2015-02-24 DIAGNOSIS — C3491 Malignant neoplasm of unspecified part of right bronchus or lung: Secondary | ICD-10-CM

## 2015-02-24 DIAGNOSIS — E119 Type 2 diabetes mellitus without complications: Secondary | ICD-10-CM

## 2015-02-24 DIAGNOSIS — Z9221 Personal history of antineoplastic chemotherapy: Secondary | ICD-10-CM

## 2015-02-24 DIAGNOSIS — Z79899 Other long term (current) drug therapy: Secondary | ICD-10-CM | POA: Insufficient documentation

## 2015-02-24 DIAGNOSIS — I2510 Atherosclerotic heart disease of native coronary artery without angina pectoris: Secondary | ICD-10-CM | POA: Insufficient documentation

## 2015-02-24 DIAGNOSIS — C3411 Malignant neoplasm of upper lobe, right bronchus or lung: Secondary | ICD-10-CM

## 2015-02-24 DIAGNOSIS — F17210 Nicotine dependence, cigarettes, uncomplicated: Secondary | ICD-10-CM | POA: Insufficient documentation

## 2015-02-24 LAB — HEPATIC FUNCTION PANEL
ALT: 11 U/L — AB (ref 17–63)
AST: 19 U/L (ref 15–41)
Albumin: 3.5 g/dL (ref 3.5–5.0)
Alkaline Phosphatase: 75 U/L (ref 38–126)
Bilirubin, Direct: 0.1 mg/dL (ref 0.1–0.5)
Indirect Bilirubin: 0.2 mg/dL — ABNORMAL LOW (ref 0.3–0.9)
TOTAL PROTEIN: 6.5 g/dL (ref 6.5–8.1)
Total Bilirubin: 0.3 mg/dL (ref 0.3–1.2)

## 2015-02-24 LAB — BASIC METABOLIC PANEL
ANION GAP: 5 (ref 5–15)
BUN: 11 mg/dL (ref 6–20)
CALCIUM: 9 mg/dL (ref 8.9–10.3)
CO2: 27 mmol/L (ref 22–32)
CREATININE: 0.84 mg/dL (ref 0.61–1.24)
Chloride: 104 mmol/L (ref 101–111)
GFR calc Af Amer: >60 mL/min (ref >60)
GFR calc non Af Amer: >60 mL/min (ref >60)
GLUCOSE: 96 mg/dL (ref 65–99)
Potassium: 4.3 mmol/L (ref 3.5–5.1)
Sodium: 136 mmol/L (ref 135–145)

## 2015-02-24 LAB — CBC WITH DIFFERENTIAL/PLATELET
Basophils Absolute: 0 10*3/uL (ref 0–0.1)
Basophils Relative: 0 %
Eosinophils Absolute: 0.1 10*3/uL (ref 0–0.7)
Eosinophils Relative: 2 %
HEMATOCRIT: 32.8 % — AB (ref 40.0–52.0)
Hemoglobin: 10.9 g/dL — ABNORMAL LOW (ref 13.0–18.0)
LYMPHS PCT: 21 %
Lymphs Abs: 1.3 10*3/uL (ref 1.0–3.6)
MCH: 29.6 pg (ref 26.0–34.0)
MCHC: 33.1 g/dL (ref 32.0–36.0)
MCV: 89.6 fL (ref 80.0–100.0)
MONO ABS: 0.7 10*3/uL (ref 0.2–1.0)
MONOS PCT: 11 %
NEUTROS ABS: 4.1 10*3/uL (ref 1.4–6.5)
Neutrophils Relative %: 66 %
Platelets: 291 10*3/uL (ref 150–440)
RBC: 3.66 MIL/uL — ABNORMAL LOW (ref 4.40–5.90)
RDW: 13.9 % (ref 11.5–14.5)
WBC: 6.3 10*3/uL (ref 3.8–10.6)

## 2015-02-24 MED ORDER — SODIUM CHLORIDE 0.9 % IJ SOLN
10.0000 mL | Freq: Once | INTRAMUSCULAR | Status: AC
  Administered 2015-02-24: 10 mL via INTRAVENOUS
  Filled 2015-02-24: qty 10

## 2015-02-24 MED ORDER — IOHEXOL 300 MG/ML  SOLN
75.0000 mL | Freq: Once | INTRAMUSCULAR | Status: AC | PRN
  Administered 2015-02-24: 75 mL via INTRAVENOUS

## 2015-02-24 MED ORDER — HEPARIN SOD (PORK) LOCK FLUSH 100 UNIT/ML IV SOLN
500.0000 [IU] | Freq: Once | INTRAVENOUS | Status: AC
  Administered 2015-02-24: 500 [IU] via INTRAVENOUS
  Filled 2015-02-24: qty 5

## 2015-02-25 ENCOUNTER — Inpatient Hospital Stay (HOSPITAL_BASED_OUTPATIENT_CLINIC_OR_DEPARTMENT_OTHER): Payer: Medicare Other | Admitting: Family Medicine

## 2015-02-25 VITALS — BP 127/87 | HR 60 | Temp 95.4°F | Wt 186.6 lb

## 2015-02-25 DIAGNOSIS — K222 Esophageal obstruction: Secondary | ICD-10-CM | POA: Diagnosis not present

## 2015-02-25 DIAGNOSIS — I2510 Atherosclerotic heart disease of native coronary artery without angina pectoris: Secondary | ICD-10-CM

## 2015-02-25 DIAGNOSIS — I10 Essential (primary) hypertension: Secondary | ICD-10-CM

## 2015-02-25 DIAGNOSIS — Z9221 Personal history of antineoplastic chemotherapy: Secondary | ICD-10-CM

## 2015-02-25 DIAGNOSIS — C3411 Malignant neoplasm of upper lobe, right bronchus or lung: Secondary | ICD-10-CM | POA: Diagnosis not present

## 2015-02-25 DIAGNOSIS — Z08 Encounter for follow-up examination after completed treatment for malignant neoplasm: Secondary | ICD-10-CM | POA: Diagnosis not present

## 2015-02-25 DIAGNOSIS — J90 Pleural effusion, not elsewhere classified: Secondary | ICD-10-CM

## 2015-02-25 DIAGNOSIS — R1319 Other dysphagia: Secondary | ICD-10-CM

## 2015-02-25 DIAGNOSIS — E785 Hyperlipidemia, unspecified: Secondary | ICD-10-CM

## 2015-02-25 DIAGNOSIS — F17210 Nicotine dependence, cigarettes, uncomplicated: Secondary | ICD-10-CM

## 2015-02-25 DIAGNOSIS — R634 Abnormal weight loss: Secondary | ICD-10-CM

## 2015-02-25 DIAGNOSIS — C3491 Malignant neoplasm of unspecified part of right bronchus or lung: Secondary | ICD-10-CM

## 2015-02-25 DIAGNOSIS — I313 Pericardial effusion (noninflammatory): Secondary | ICD-10-CM

## 2015-02-25 DIAGNOSIS — F209 Schizophrenia, unspecified: Secondary | ICD-10-CM

## 2015-02-25 NOTE — Progress Notes (Signed)
[place]  Telephone:(336) [phone]  Fax:(336) [phone]     Travis Maddox DOB: 1955-08-08  MR#: [id number]  WUX#:[id number]  Patient Care Team: [name], MD as PCP - General (Internal Medicine)  CHIEF COMPLAINT:  Chief Complaint  Patient presents with  . Squamous Cell Carcinoma    follow up    INTERVAL HISTORY:  Patient is here for continued evaluation and treatment consideration regarding stage III versus stage IV squamous cell carcinoma of lung. Patient currently lives in a group home and is accompanied by a caregiver. Patients caregiver reports that he has had several episodes of food getting stuck in his esophagus and this has led to regurgitating of food. Patient most recently had a CT scan of the chest with contrast on November 8 for reevaluation of lung cancer. He was last seen by Dr. Ma Hillock in August and at that time he and his caregiver felt he was too weak to continue with any kind of treatment at that time. Patient reports that he has continued with Megace and that his appetite has been very good other than episodes of food getting stuck.  REVIEW OF SYSTEMS:   Review of Systems  Constitutional: Positive for weight loss. Negative for fever, chills, malaise/fatigue and diaphoresis.  HENT: Negative for congestion and sore throat.        Difficulty with swallowing that is intermittent  Eyes: Negative.  Negative for blurred vision, double vision, photophobia, pain, discharge and redness.  Respiratory: Negative for cough, hemoptysis, sputum production, shortness of breath, wheezing and stridor.   Cardiovascular: Negative for chest pain, palpitations, orthopnea, claudication, leg swelling and PND.  Gastrointestinal: Negative for heartburn, nausea, vomiting, abdominal pain, diarrhea, constipation, blood in stool and melena.  Genitourinary: Negative.   Musculoskeletal: Negative.   Skin: Negative.   Neurological: Negative for dizziness, tingling, focal weakness,  seizures and weakness.  Endo/Heme/Allergies: Does not bruise/bleed easily.  Psychiatric/Behavioral: Negative for depression. The patient is not nervous/anxious and does not have insomnia.     As per HPI. Otherwise, a complete review of systems is negatve.  ONCOLOGY HISTORY:   Squamous cell carcinoma of right lung ([place])   08/22/2014 Initial Diagnosis Squamous cell carcinoma of right lung ([facility])    PAST MEDICAL HISTORY: Past Medical History  Diagnosis Date  . Schizophrenia ([place])   . Diabetes mellitus without complication ([place])   . Hypertension   . Hyperlipemia   . Chronic fatigue   . Squamous cell carcinoma of right lung ([place]) 08/22/2014    PAST SURGICAL HISTORY: Past Surgical History  Procedure Laterality Date  . Colonoscopy    . Peripheral vascular catheterization N/A 08/21/2014    Procedure: Glori Luis Cath Insertion;  Surgeon: Algernon Huxley, MD;  Location: [facility];  Service: Cardiovascular;  Laterality: N/A;    FAMILY HISTORY No family history on file.  GYNECOLOGIC HISTORY:  No LMP for male patient.     ADVANCED DIRECTIVES:    HEALTH MAINTENANCE: Social History  Substance Use Topics  . Smoking status: Current Every Day Smoker -- 0.25 packs/day for 42 years    Types: Cigarettes  . Smokeless tobacco: Not on file  . Alcohol Use: 1.2 oz/week    2 Cans of beer per week     Colonoscopy:  PAP:  Bone density:  Lipid panel:  No Known Allergies  Current Outpatient Prescriptions  Medication Sig Dispense Refill  . atorvastatin (LIPITOR) 10 MG tablet Take 10 mg by mouth every morning.     Marland Kitchen  levothyroxine (SYNTHROID, LEVOTHROID) 88 MCG tablet Take 88 mcg by mouth every morning.     Marland Kitchen lisinopril (PRINIVIL,ZESTRIL) 5 MG tablet Take 5 mg by mouth daily.    . megestrol (MEGACE ES) 625 MG/5ML suspension Take 5 mLs (625 mg total) by mouth daily. 150 mL 0  . nystatin (MYCOSTATIN) 100000 UNIT/ML suspension Take 5 ml up to 4 times a day as needed for discomfort in throat. (  swish and swallow). 480 mL 1  . OLANZapine (ZYPREXA) 20 MG tablet Take 30 mg by mouth at bedtime.     . ondansetron (ZOFRAN) 4 MG tablet Take 1 tablet (4 mg total) by mouth every 6 (six) hours as needed for nausea or vomiting. 20 tablet 1  . Ondansetron 4 MG FILM Take 1 each by mouth every 4 (four) hours as needed. 40 each 1  . potassium chloride SA (K-DUR,KLOR-CON) 20 MEQ tablet Take 1 tablet (20 mEq total) by mouth daily. 6 tablet 0  . prochlorperazine (COMPAZINE) 10 MG tablet Take 10 mg by mouth every 6 (six) hours as needed for nausea or vomiting.    . propranolol (INDERAL) 10 MG tablet Take 10 mg by mouth 3 (three) times daily.    . sucralfate (CARAFATE) 1 G tablet DISSOLVE 1 TABLET IN 2 TO 3 TABLESPOONSFUL OF WARM WATER AND SWALLOW THREE TIMES DAILY 84 tablet 0  . Vitamin D, Ergocalciferol, (DRISDOL) 50000 UNITS CAPS capsule Take 50,000 Units by mouth every 7 (seven) days. On Thursday     No current facility-administered medications for this visit.   Facility-Administered Medications Ordered in Other Visits  Medication Dose Route Frequency Provider Last Rate Last Dose  . sodium chloride 0.9 % injection 10 mL  10 mL Intracatheter PRN Leia Alf, MD   10 mL at 11/11/14 0849  . sodium chloride 0.9 % injection 10 mL  10 mL Intracatheter PRN Leia Alf, MD   10 mL at 11/18/14 0820    OBJECTIVE: BP 127/87 mmHg  Pulse 60  Temp(Src) 95.4 F (35.2 C) (Tympanic)  Wt 186 lb 9.9 oz (84.65 kg)   Body mass index is 25.3 kg/(m^2).    ECOG FS:0 - Asymptomatic  General: Well-developed, well-nourished, no acute distress. Patient is mostly nonverbal. Eyes: Pink conjunctiva, anicteric sclera. HEENT: Normocephalic, moist mucous membranes, clear oropharnyx. Lungs: Clear to auscultation bilaterally. Heart: Regular rate and rhythm. No rubs, murmurs, or gallops. Abdomen: Soft, nontender, nondistended. No organomegaly noted, normoactive bowel sounds. Musculoskeletal: No edema, cyanosis, or  clubbing. Neuro: Alert, answering all questions appropriately. Cranial nerves grossly intact. Skin: No rashes or petechiae noted. Psych: Normal affect.    LAB RESULTS:  Infusion on 02/24/2015  Component Date Value Ref Range Status  . WBC 02/24/2015 6.3  3.8 - 10.6 K/uL Final   A-LINE DRAW  . RBC 02/24/2015 3.66* 4.40 - 5.90 MIL/uL Final  . Hemoglobin 02/24/2015 10.9* 13.0 - 18.0 g/dL Final  . HCT 02/24/2015 32.8* 40.0 - 52.0 % Final  . MCV 02/24/2015 89.6  80.0 - 100.0 fL Final  . MCH 02/24/2015 29.6  26.0 - 34.0 pg Final  . MCHC 02/24/2015 33.1  32.0 - 36.0 g/dL Final  . RDW 02/24/2015 13.9  11.5 - 14.5 % Final  . Platelets 02/24/2015 291  150 - 440 K/uL Final  . Neutrophils Relative % 02/24/2015 66   Final  . Neutro Abs 02/24/2015 4.1  1.4 - 6.5 K/uL Final  . Lymphocytes Relative 02/24/2015 21   Final  . Lymphs Abs 02/24/2015 1.3  1.0 - 3.6 K/uL Final  . Monocytes Relative 02/24/2015 11   Final  . Monocytes Absolute 02/24/2015 0.7  0.2 - 1.0 K/uL Final  . Eosinophils Relative 02/24/2015 2   Final  . Eosinophils Absolute 02/24/2015 0.1  0 - 0.7 K/uL Final  . Basophils Relative 02/24/2015 0   Final  . Basophils Absolute 02/24/2015 0.0  0 - 0.1 K/uL Final  . Sodium 02/24/2015 136  135 - 145 mmol/L Final  . Potassium 02/24/2015 4.3  3.5 - 5.1 mmol/L Final  . Chloride 02/24/2015 104  101 - 111 mmol/L Final  . CO2 02/24/2015 27  22 - 32 mmol/L Final  . Glucose, Bld 02/24/2015 96  65 - 99 mg/dL Final  . BUN 02/24/2015 11  6 - 20 mg/dL Final  . Creatinine, Ser 02/24/2015 0.84  0.61 - 1.24 mg/dL Final  . Calcium 02/24/2015 9.0  8.9 - 10.3 mg/dL Final  . GFR calc non Af Amer 02/24/2015 >60  >60 mL/min Final  . GFR calc Af Amer 02/24/2015 >60  >60 mL/min Final   Comment: (NOTE) The eGFR has been calculated using the CKD EPI equation. This calculation has not been validated in all clinical situations. eGFR's persistently <60 mL/min signify possible Chronic Kidney Disease.   .  Anion gap 02/24/2015 5  5 - 15 Final  . Total Protein 02/24/2015 6.5  6.5 - 8.1 g/dL Final  . Albumin 02/24/2015 3.5  3.5 - 5.0 g/dL Final  . AST 02/24/2015 19  15 - 41 U/L Final  . ALT 02/24/2015 11* 17 - 63 U/L Final  . Alkaline Phosphatase 02/24/2015 75  38 - 126 U/L Final  . Total Bilirubin 02/24/2015 0.3  0.3 - 1.2 mg/dL Final  . Bilirubin, Direct 02/24/2015 0.1  0.1 - 0.5 mg/dL Final  . Indirect Bilirubin 02/24/2015 0.2* 0.3 - 0.9 mg/dL Final    STUDIES: Ct Chest W Contrast  02/24/2015  CLINICAL DATA:  Squamous cell carcinoma of the right upper lobe, restaging assessment. EXAM: CT CHEST WITH CONTRAST TECHNIQUE: Multidetector CT imaging of the chest was performed during intravenous contrast administration. CONTRAST:  57m OMNIPAQUE IOHEXOL 300 MG/ML  SOLN COMPARISON:  Multiple exams, including 11/27/2014 FINDINGS: Mediastinum/Nodes: Similar mid esophageal wall thickening and distal esophageal wall thickening compared to prior. Small type 1 hiatal hernia. Coronary, aortic arch, and branch vessel atherosclerotic vascular disease. Right hilar node stable at 1 cm short axis, image 26 series 2. Small anterior pericardial effusion similar to prior. Lungs/Pleura: Trace right pleural effusion. Band of density in the right upper lobe anterior to the major fissure measures up to 9 mm in short axis, similar to prior there is some increased peripheral bandlike in nodular density in the right upper lobe on image 15 series 3, including a 0.6 by 0.8 cm nodule which appears new. Airway thickening in the right upper lobe is increased and there is also generalized airway thickening. Interstitial accentuation noted with volume loss in the right upper lobe. Faint reticulonodular interstitial accentuation at the right lung base, likely infectious/ inflammatory. There is some mild scarring or atelectasis anteriorly in the superior segment right lower lobe along the major fissure. Centrilobular emphysema. Slight septal  thickening along a left upper lobe bulla corresponding to a previous nodule at this location, but without a current nodular appearance, image 12 series 3. A 3 mm left lower lobe nodule on image 47 of series 3 is unchanged from 11/27/2014 and likewise appears unchanged from 03/23/2004, and accordingly is likely benign.  Upper abdomen: Hypodense lesion in segment 4b of the liver, 1.2 by 0.9 cm, not previously hypermetabolic on PET-CT, likely benign. Slight fullness of the medial limb left adrenal gland, was present on prior PET-CT but not hypermetabolic, no overt mass, likely benign. Musculoskeletal: Probable chondroid lesion anteriorly in the right third rib, not previously hypermetabolic. Old left anterolateral rib fractures, healed. IMPRESSION: 1. The bandlike right upper lobe opacity along the major fissure is similar to the 11/27/2014 exam, although there is some increased peripheral volume loss with some nodularity in the right upper lobe as on image 15 of series 3 which is new. Much of this could be due to radiation pneumonitis, and the generalized airway thickening is more prominent in the right upper lobe. Continued monitoring of the region is suggested. 2. No additional significant new findings. There is some septal thickening along the a bulla in the left upper lobe where there was previously a nodule, but no significant nodular component currently. This septal thickening likely merits observation. 3. New trace right pleural effusion, nonspecific for exudative versus transudative. 4. Continued esophageal thickening in the mid and distal esophagus. 5. Small anterior pericardial effusion, stable. 6. Small type 1 hiatal hernia. 7. Coronary, aortic arch, and branch vessel atherosclerotic vascular disease. 8. Stable likely benign hypodense lesion in segment 4b of the liver. Electronically Signed   By: Van Clines M.D.   On: 02/24/2015 11:47    ASSESSMENT:  Stage III B versus stage IV squamous cell  carcinoma of right upper lobe of lung.  PLAN:   1. Lung CA. Previously staged as a stage IV due to left lung nodule that was thought to be metastatic. In the evening's was attempted to diagnosed left lung nodule but was unable to be performed due to hypotension. As previously discussed with Dr. Ma Hillock, patient and caretaker present feel that he cannot tolerate more chemotherapy at this time, have discussed that there is a high risk of recurrence in the future but plan at this time is to hold off on further chemotherapy and monitor. CT scan that was performed on 02/24/2015, noted to have bandlike right upper lobe and paste fatigue along the major fissure and is similar to previous exam in August. There is some increased peripheral volume loss with some nodularity in the right upper lobe, this could be related to radiation pneumonitis, continued monitoring suggested. There is also noted some septal thickening along the bulla in the left upper lobe where there was previously a nodule, no significant nodular component currently, continue to monitor as well. He does have a trace right pleural effusion as well. Also has continued esophageal thickening in the mid and distal esophagus which explains dysphasia. Other incidental findings include a small anterior pericardial effusion that is stable, small type I hiatal hernia, coronary artery disease, as well as stable likely benign hypodense lesion in the liver.  Patient and caregiver report overall improvement in appetite as well as strength and energy. They are in agreement with continued follow-up in approximately 3 months with a repeat CT scan of the chest at that time. We will have patient establish care with new medical oncologist at that time. Again discussed with patient and caregiver that recurrent disease is likely, they continue to wish for conservative monitoring.  Patient expressed understanding and was in agreement with this plan. He also understands  that He can call clinic at any time with any questions, concerns, or complaints.   Dr. Oliva Bustard was available for consultation and review  of plan of care for this patient.  Squamous cell carcinoma of right lung [facility])   Staging form: Lung, AJCC 7th Edition     Clinical stage from 07/17/2014: Stage IV (T4, N0, M1a) - Signed by Leia Alf, MD on 09/29/2014   Evlyn Kanner, NP   02/25/2015 9:43 AM

## 2015-03-17 ENCOUNTER — Other Ambulatory Visit: Payer: Self-pay | Admitting: Radiation Oncology

## 2015-04-07 ENCOUNTER — Emergency Department
Admission: EM | Admit: 2015-04-07 | Discharge: 2015-04-07 | Disposition: A | Discharge status: Home / Self Care | Payer: Medicare Other | Attending: Emergency Medicine | Admitting: Emergency Medicine

## 2015-04-07 ENCOUNTER — Encounter: Payer: Self-pay | Admitting: Medical Oncology

## 2015-04-07 DIAGNOSIS — E119 Type 2 diabetes mellitus without complications: Secondary | ICD-10-CM | POA: Insufficient documentation

## 2015-04-07 DIAGNOSIS — F17210 Nicotine dependence, cigarettes, uncomplicated: Secondary | ICD-10-CM | POA: Diagnosis not present

## 2015-04-07 DIAGNOSIS — I10 Essential (primary) hypertension: Secondary | ICD-10-CM | POA: Diagnosis not present

## 2015-04-07 DIAGNOSIS — K529 Noninfective gastroenteritis and colitis, unspecified: Secondary | ICD-10-CM | POA: Diagnosis not present

## 2015-04-07 DIAGNOSIS — R1110 Vomiting, unspecified: Secondary | ICD-10-CM | POA: Diagnosis present

## 2015-04-07 LAB — COMPREHENSIVE METABOLIC PANEL
ALK PHOS: 91 U/L (ref 38–126)
ALT: 10 U/L — ABNORMAL LOW (ref 17–63)
ANION GAP: 11 (ref 5–15)
AST: 17 U/L (ref 15–41)
Albumin: 3.8 g/dL (ref 3.5–5.0)
BILIRUBIN TOTAL: 1 mg/dL (ref 0.3–1.2)
BUN: 17 mg/dL (ref 6–20)
CALCIUM: 9.1 mg/dL (ref 8.9–10.3)
CO2: 21 mmol/L — ABNORMAL LOW (ref 22–32)
Chloride: 106 mmol/L (ref 101–111)
Creatinine, Ser: 1.06 mg/dL (ref 0.61–1.24)
GLUCOSE: 97 mg/dL (ref 65–99)
Potassium: 4.1 mmol/L (ref 3.5–5.1)
Sodium: 138 mmol/L (ref 135–145)
TOTAL PROTEIN: 7.3 g/dL (ref 6.5–8.1)

## 2015-04-07 LAB — CBC
HCT: 34.7 % — ABNORMAL LOW (ref 40.0–52.0)
HEMOGLOBIN: 10.8 g/dL — AB (ref 13.0–18.0)
MCH: 28.2 pg (ref 26.0–34.0)
MCHC: 31.2 g/dL — AB (ref 32.0–36.0)
MCV: 90.2 fL (ref 80.0–100.0)
Platelets: 257 10*3/uL (ref 150–440)
RBC: 3.84 MIL/uL — AB (ref 4.40–5.90)
RDW: 14.1 % (ref 11.5–14.5)
WBC: 12.2 10*3/uL — ABNORMAL HIGH (ref 3.8–10.6)

## 2015-04-07 LAB — LIPASE, BLOOD: Lipase: 16 U/L (ref 11–51)

## 2015-04-07 MED ORDER — SODIUM CHLORIDE 0.9 % IV SOLN
Freq: Once | INTRAVENOUS | Status: AC
  Administered 2015-04-07: 10:00:00 via INTRAVENOUS

## 2015-04-07 MED ORDER — ONDANSETRON HCL 4 MG PO TABS: 4.0000 mg | ORAL_TABLET | Freq: Every day | ORAL | Status: DC | PRN

## 2015-04-07 MED ORDER — ONDANSETRON HCL 4 MG/2ML IJ SOLN
4.0000 mg | Freq: Once | INTRAMUSCULAR | Status: AC
  Administered 2015-04-07: 4 mg via INTRAVENOUS
  Filled 2015-04-07: qty 2

## 2015-04-07 NOTE — ED Provider Notes (Signed)
[facility] Emergency Department Provider Note     Time seen: ----------------------------------------- 8:58 AM on 04/07/2015 -----------------------------------------    I have reviewed the triage vital signs and the nursing notes.   HISTORY  Chief Complaint Emesis and Diarrhea    HPI Travis Maddox is a 59 y.o. male who is brought to ER by caregiver for vomiting and diarrhea. Initial report was that he hasn't been able to keep anything down for about a week, this been clarified to be about 3 days. These reports some generalized abdominal pain, nothing makes it better or worse.   Past Medical History  Diagnosis Date  . Schizophrenia ([place])   . Diabetes mellitus without complication ([place])   . Hypertension   . Hyperlipemia   . Chronic fatigue   . 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 Cancer ([place]) 10/30/2014  . 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 Colonoscopy    . Peripheral vascular catheterization N/A 08/21/2014    Procedure: Glori Luis Cath Insertion;  Surgeon: Algernon Huxley, MD;  Location: [facility];  Service: Cardiovascular;  Laterality: N/A;    Allergies Review of patient's allergies indicates no known allergies.  Social History Social History  Substance Use Topics  . Smoking status: Current Every Day Smoker -- 0.25 packs/day for 42 years    Types: Cigarettes  . Smokeless tobacco: None  . Alcohol Use: 1.2 oz/week    2 Cans of beer per week    Review of Systems Constitutional: Negative for fever. Eyes: Negative for visual changes. ENT: Negative for sore throat. Cardiovascular: Negative for chest pain. Respiratory: Negative for shortness of breath. Gastrointestinal: Positive for abdominal pain, vomiting and diarrhea Genitourinary: Negative for dysuria. Musculoskeletal: Negative for  back pain. Skin: Negative for rash. Neurological: Negative for headaches, focal weakness or numbness.  10-point ROS otherwise negative.  ____________________________________________   PHYSICAL EXAM:  VITAL SIGNS: ED Triage Vitals  Enc Vitals Group     BP 04/07/15 0846 122/79 mmHg     Pulse Rate 04/07/15 0846 86     Resp 04/07/15 0846 20     Temp 04/07/15 0851 97.6 F (36.4 C)     Temp Source 04/07/15 0851 Oral     SpO2 04/07/15 0846 94 %     Weight --      Height 04/07/15 0846 6' (1.829 m)     Head Cir --      Peak Flow --      Pain Score --      Pain Loc --      Pain Edu? --      Excl. in [place]? --     Constitutional: Alert and oriented. Well appearing and in no distress. Eyes: Conjunctivae are normal. PERRL. Normal extraocular movements. ENT   Head: Normocephalic and atraumatic.   Nose: No congestion/rhinnorhea.   Mouth/Throat: Mucous membranes are moist.   Neck: No stridor. Cardiovascular: Normal rate, regular rhythm. Normal and symmetric distal pulses are present in all extremities. No murmurs, rubs, or gallops. Respiratory: Normal respiratory effort without tachypnea nor retractions. Breath sounds are clear and equal bilaterally. No wheezes/rales/rhonchi. Gastrointestinal: Soft and nontender. No distention. No abdominal bruits.  Musculoskeletal: Nontender with normal range of motion in all extremities. No joint effusions.  No lower extremity tenderness nor edema. Neurologic:  Normal speech and  language. No gross focal neurologic deficits are appreciated. Speech is normal. No gait instability. Skin:  Skin is warm, dry and intact. No rash noted. Psychiatric: Mood and affect are normal. Speech and behavior are normal. Patient exhibits appropriate insight and judgment. ____________________________________________  ED COURSE:  Pertinent labs & imaging results that were available during my care of the patient were reviewed by me and considered in my medical  decision making (see chart for details). Patient is in no acute distress, will check basic labs and give IV fluids with antiemetics. ____________________________________________    LABS (pertinent positives/negatives)  Labs Reviewed  COMPREHENSIVE METABOLIC PANEL - Abnormal; Notable for the following:    CO2 21 (*)    ALT 10 (*)    All other components within normal limits  CBC - Abnormal; Notable for the following:    WBC 12.2 (*)    RBC 3.84 (*)    Hemoglobin 10.8 (*)    HCT 34.7 (*)    MCHC 31.2 (*)    All other components within normal limits  LIPASE, BLOOD  URINALYSIS COMPLETEWITH MICROSCOPIC ([facility] ONLY)   ____________________________________________  FINAL ASSESSMENT AND PLAN  Abdominal pain, vomiting and diarrhea  Plan: Patient with labs and imaging as dictated above. Patient is currently feeling better, labs are revealed mild leukocytosis. He is tolerated by mouth well here and states he feels better wants to eat. He is stable for discharge with Zofran.   Earleen Newport, MD   Earleen Newport, MD 04/07/15 [phone]

## 2015-04-07 NOTE — Discharge Instructions (Signed)
Norovirus Infection °A norovirus infection is caused by exposure to a virus in a group of similar viruses (noroviruses). This type of infection causes inflammation in your stomach and intestines (gastroenteritis). Norovirus is the most common cause of gastroenteritis. It also causes food poisoning. °Anyone can get a norovirus infection. It spreads very easily (contagious). You can get it from contaminated food, water, surfaces, or other people. Norovirus is found in the stool or vomit of infected people. You can spread the infection as soon as you feel sick until 2 weeks after you recover.  °Symptoms usually begin within 2 days after you become infected. Most norovirus symptoms affect the digestive system. °CAUSES °Norovirus infection is caused by contact with norovirus. You can catch norovirus if you: °· Eat or drink something contaminated with norovirus. °· Touch surfaces or objects contaminated with norovirus and then put your hand in your mouth. °· Have direct contact with an infected person who has symptoms. °· Share food, drink, or utensils with someone with who is sick with norovirus. °SIGNS AND SYMPTOMS °Symptoms of norovirus may include: °· Nausea. °· Vomiting. °· Diarrhea. °· Stomach cramps. °· Fever. °· Chills. °· Headache. °· Muscle aches. °· Tiredness. °DIAGNOSIS °Your health care provider may suspect norovirus based on your symptoms and physical exam. Your health care provider may also test a sample of your stool or vomit for the virus.  °TREATMENT °There is no specific treatment for norovirus. Most people get better without treatment in about 2 days. °HOME CARE INSTRUCTIONS °· Replace lost fluids by drinking plenty of water or rehydration fluids containing important minerals called electrolytes. This prevents dehydration. Drink enough fluid to keep your urine clear or pale yellow. °· Do not prepare food for others while you are infected. Wait at least 3 days after recovering from the illness to do  that. °PREVENTION  °· Wash your hands often, especially after using the toilet or changing a diaper. °· Wash fruits and vegetables thoroughly before preparing or serving them. °· Throw out any food that a sick person may have touched. °· Disinfect contaminated surfaces immediately after someone in the household has been sick. Use a bleach-based household cleaner. °· Immediately remove and wash soiled clothes or sheets. °SEEK MEDICAL CARE IF: °· Your vomiting, diarrhea, and stomach pain is getting worse. °· Your symptoms of norovirus do not go away after 2-3 days. °SEEK IMMEDIATE MEDICAL CARE IF:  °You develop symptoms of dehydration that do not improve with fluid replacement. This may include: °· Excessive sleepiness. °· Lack of tears. °· Dry mouth. °· Dizziness when standing. °· Weak pulse. °  °This information is not intended to replace advice given to you by your health care provider. Make sure you discuss any questions you have with your health care provider. °  °Document Released: 06/25/2002 Document Revised: 04/25/2014 Document Reviewed: 09/12/2013 °Elsevier Interactive Patient Education ©2016 [employer] ° °

## 2015-04-07 NOTE — ED Notes (Signed)
C/o n/v/d .  Denies pain at this time.  Skin w/d, a&o, MAE, lungs clear, heart sounds normal.  NAD

## 2015-04-07 NOTE — ED Notes (Signed)
Pt with care giver who reports pt has been unable to keep food/liquids down for about 1 week, reports vomiting and diarrhea also reports generalized abd pain.

## 2015-04-07 NOTE — ED Notes (Signed)
Bladder scanner 195

## 2015-04-08 ENCOUNTER — Inpatient Hospital Stay: Payer: Medicare Other | Attending: Internal Medicine

## 2015-04-08 DIAGNOSIS — C3411 Malignant neoplasm of upper lobe, right bronchus or lung: Secondary | ICD-10-CM | POA: Diagnosis not present

## 2015-04-08 DIAGNOSIS — Z9221 Personal history of antineoplastic chemotherapy: Secondary | ICD-10-CM | POA: Insufficient documentation

## 2015-04-08 DIAGNOSIS — C3491 Malignant neoplasm of unspecified part of right bronchus or lung: Secondary | ICD-10-CM

## 2015-04-08 MED ORDER — SODIUM CHLORIDE 0.9 % IJ SOLN
10.0000 mL | Freq: Once | INTRAMUSCULAR | Status: AC
  Administered 2015-04-08: 10 mL via INTRAVENOUS
  Filled 2015-04-08: qty 10

## 2015-04-08 MED ORDER — HEPARIN SOD (PORK) LOCK FLUSH 100 UNIT/ML IV SOLN
500.0000 [IU] | Freq: Once | INTRAVENOUS | Status: AC
  Administered 2015-04-08: 500 [IU] via INTRAVENOUS

## 2015-04-08 MED ORDER — HEPARIN SOD (PORK) LOCK FLUSH 100 UNIT/ML IV SOLN
INTRAVENOUS | Status: AC
Start: 2015-04-08 — End: 2015-04-08
  Filled 2015-04-08: qty 5

## 2015-04-16 ENCOUNTER — Other Ambulatory Visit: Payer: Self-pay | Admitting: Nurse Practitioner

## 2015-05-05 ENCOUNTER — Encounter: Payer: Self-pay | Admitting: Radiation Oncology

## 2015-05-05 ENCOUNTER — Ambulatory Visit
Admission: RE | Admit: 2015-05-05 | Discharge: 2015-05-05 | Disposition: A | Discharge status: Home / Self Care | Payer: Medicare Other | Source: Ambulatory Visit | Attending: Radiation Oncology | Admitting: Radiation Oncology

## 2015-05-05 VITALS — BP 150/95 | HR 58 | Temp 96.5°F | Resp 22 | Wt 183.1 lb

## 2015-05-05 DIAGNOSIS — C3491 Malignant neoplasm of unspecified part of right bronchus or lung: Secondary | ICD-10-CM

## 2015-05-05 NOTE — Progress Notes (Signed)
Radiation Oncology Follow up Note  Name: Travis Maddox   Date:   05/05/2015 MRN:  [id number] DOB: Mar 17, 1956    This 60 y.o. 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 He is seen today in routine follow-up is doing well still has a slight whitish productive cough no hemoptysissignificant shortness of breath or dyspnea on exertion.. He had a CT scan of the chest back in November showing excellent response to treatment with changes consistent with radiation scarring.  COMPLICATIONS OF TREATMENT: none  FOLLOW UP COMPLIANCE: keeps appointments   PHYSICAL EXAM:  BP 150/95 mmHg  Pulse 58  Temp(Src) 96.5 F (35.8 C)  Resp 22  Wt 183 lb 1.5 oz (83.05 kg) Well-developed well-nourished patient in NAD. HEENT reveals PERLA, EOMI, discs not visualized.  Oral cavity is clear. No oral mucosal lesions are identified. Neck is clear without evidence of cervical or supraclavicular adenopathy. Lungs are clear to A&P. Cardiac examination is essentially unremarkable with regular rate and rhythm without murmur rub or thrill. Abdomen is benign with no organomegaly or masses noted. Motor sensory and DTR levels are equal and symmetric in the upper and lower extremities. Cranial nerves II through XII are grossly intact. Proprioception is intact. No peripheral adenopathy or edema is identified. No motor or sensory levels are noted. Crude visual fields are within normal range.  RADIOLOGY RESULTS: Previous CT scans are reviewed  PLAN: At the present time he is doing well. I'm please was overall progress. He has a second follow-up CT scan ordered in February I will review that when it  becomes available. Otherwise I'm please was overall progress. I've asked to see him back in 6 months for follow-up. He continues close follow-up care with medical oncology.  I would like to take this opportunity for allowing me to participate in the care of your patient.Armstead Peaks., MD

## 2015-05-20 ENCOUNTER — Inpatient Hospital Stay: Payer: Medicare Other | Attending: Internal Medicine

## 2015-05-20 DIAGNOSIS — I700 Atherosclerosis of aorta: Secondary | ICD-10-CM | POA: Diagnosis not present

## 2015-05-20 DIAGNOSIS — Z452 Encounter for adjustment and management of vascular access device: Secondary | ICD-10-CM | POA: Diagnosis not present

## 2015-05-20 DIAGNOSIS — J439 Emphysema, unspecified: Secondary | ICD-10-CM | POA: Diagnosis not present

## 2015-05-20 DIAGNOSIS — R911 Solitary pulmonary nodule: Secondary | ICD-10-CM | POA: Insufficient documentation

## 2015-05-20 DIAGNOSIS — F209 Schizophrenia, unspecified: Secondary | ICD-10-CM | POA: Insufficient documentation

## 2015-05-20 DIAGNOSIS — R5382 Chronic fatigue, unspecified: Secondary | ICD-10-CM | POA: Insufficient documentation

## 2015-05-20 DIAGNOSIS — E039 Hypothyroidism, unspecified: Secondary | ICD-10-CM | POA: Diagnosis not present

## 2015-05-20 DIAGNOSIS — Z79899 Other long term (current) drug therapy: Secondary | ICD-10-CM | POA: Insufficient documentation

## 2015-05-20 DIAGNOSIS — Z9221 Personal history of antineoplastic chemotherapy: Secondary | ICD-10-CM | POA: Insufficient documentation

## 2015-05-20 DIAGNOSIS — C3411 Malignant neoplasm of upper lobe, right bronchus or lung: Secondary | ICD-10-CM | POA: Insufficient documentation

## 2015-05-20 DIAGNOSIS — E119 Type 2 diabetes mellitus without complications: Secondary | ICD-10-CM | POA: Insufficient documentation

## 2015-05-20 DIAGNOSIS — R1310 Dysphagia, unspecified: Secondary | ICD-10-CM | POA: Diagnosis not present

## 2015-05-20 DIAGNOSIS — E785 Hyperlipidemia, unspecified: Secondary | ICD-10-CM | POA: Insufficient documentation

## 2015-05-20 DIAGNOSIS — I10 Essential (primary) hypertension: Secondary | ICD-10-CM | POA: Insufficient documentation

## 2015-05-20 DIAGNOSIS — Z923 Personal history of irradiation: Secondary | ICD-10-CM | POA: Insufficient documentation

## 2015-05-20 DIAGNOSIS — Z87891 Personal history of nicotine dependence: Secondary | ICD-10-CM | POA: Insufficient documentation

## 2015-05-20 DIAGNOSIS — C801 Malignant (primary) neoplasm, unspecified: Secondary | ICD-10-CM

## 2015-05-20 MED ORDER — HEPARIN SOD (PORK) LOCK FLUSH 100 UNIT/ML IV SOLN
500.0000 [IU] | Freq: Once | INTRAVENOUS | Status: AC
  Administered 2015-05-20: 500 [IU] via INTRAVENOUS
  Filled 2015-05-20: qty 5

## 2015-05-20 MED ORDER — SODIUM CHLORIDE 0.9% FLUSH
10.0000 mL | Freq: Once | INTRAVENOUS | Status: AC
  Administered 2015-05-20: 10 mL via INTRAVENOUS
  Filled 2015-05-20: qty 10

## 2015-05-26 ENCOUNTER — Ambulatory Visit
Admission: RE | Admit: 2015-05-26 | Discharge: 2015-05-26 | Disposition: A | Discharge status: Home / Self Care | Payer: Medicare Other | Source: Ambulatory Visit | Attending: Family Medicine | Admitting: Family Medicine

## 2015-05-26 ENCOUNTER — Other Ambulatory Visit: Payer: Self-pay | Admitting: Family Medicine

## 2015-05-26 DIAGNOSIS — I700 Atherosclerosis of aorta: Secondary | ICD-10-CM | POA: Diagnosis not present

## 2015-05-26 DIAGNOSIS — R911 Solitary pulmonary nodule: Secondary | ICD-10-CM | POA: Insufficient documentation

## 2015-05-26 DIAGNOSIS — I2510 Atherosclerotic heart disease of native coronary artery without angina pectoris: Secondary | ICD-10-CM | POA: Diagnosis not present

## 2015-05-26 DIAGNOSIS — C3491 Malignant neoplasm of unspecified part of right bronchus or lung: Secondary | ICD-10-CM | POA: Diagnosis present

## 2015-05-26 DIAGNOSIS — Z923 Personal history of irradiation: Secondary | ICD-10-CM | POA: Insufficient documentation

## 2015-05-26 HISTORY — DX: Hypothyroidism, unspecified: E03.9

## 2015-05-26 HISTORY — DX: Polyp of colon: K63.5

## 2015-05-26 LAB — POCT I-STAT CREATININE: CREATININE: 0.8 mg/dL (ref 0.61–1.24)

## 2015-05-26 MED ORDER — IOHEXOL 300 MG/ML  SOLN
75.0000 mL | Freq: Once | INTRAMUSCULAR | Status: AC | PRN
  Administered 2015-05-26: 75 mL via INTRAVENOUS

## 2015-05-28 ENCOUNTER — Inpatient Hospital Stay (HOSPITAL_BASED_OUTPATIENT_CLINIC_OR_DEPARTMENT_OTHER): Payer: Medicare Other | Admitting: Internal Medicine

## 2015-05-28 ENCOUNTER — Inpatient Hospital Stay: Payer: Medicare Other

## 2015-05-28 ENCOUNTER — Encounter: Payer: Self-pay | Admitting: Internal Medicine

## 2015-05-28 VITALS — BP 129/82 | HR 72 | Temp 96.2°F | Resp 18 | Ht 72.0 in | Wt 182.5 lb

## 2015-05-28 DIAGNOSIS — Z923 Personal history of irradiation: Secondary | ICD-10-CM | POA: Diagnosis not present

## 2015-05-28 DIAGNOSIS — C3491 Malignant neoplasm of unspecified part of right bronchus or lung: Secondary | ICD-10-CM

## 2015-05-28 DIAGNOSIS — R1310 Dysphagia, unspecified: Secondary | ICD-10-CM

## 2015-05-28 DIAGNOSIS — F209 Schizophrenia, unspecified: Secondary | ICD-10-CM

## 2015-05-28 DIAGNOSIS — Z9221 Personal history of antineoplastic chemotherapy: Secondary | ICD-10-CM

## 2015-05-28 DIAGNOSIS — E119 Type 2 diabetes mellitus without complications: Secondary | ICD-10-CM

## 2015-05-28 DIAGNOSIS — E785 Hyperlipidemia, unspecified: Secondary | ICD-10-CM

## 2015-05-28 DIAGNOSIS — C3411 Malignant neoplasm of upper lobe, right bronchus or lung: Secondary | ICD-10-CM | POA: Diagnosis not present

## 2015-05-28 DIAGNOSIS — R5382 Chronic fatigue, unspecified: Secondary | ICD-10-CM

## 2015-05-28 DIAGNOSIS — R911 Solitary pulmonary nodule: Secondary | ICD-10-CM | POA: Diagnosis not present

## 2015-05-28 DIAGNOSIS — E039 Hypothyroidism, unspecified: Secondary | ICD-10-CM

## 2015-05-28 DIAGNOSIS — Z87891 Personal history of nicotine dependence: Secondary | ICD-10-CM

## 2015-05-28 DIAGNOSIS — I10 Essential (primary) hypertension: Secondary | ICD-10-CM

## 2015-05-28 LAB — CBC WITH DIFFERENTIAL/PLATELET
Basophils Absolute: 0 10*3/uL (ref 0–0.1)
Basophils Relative: 0 %
EOS PCT: 0 %
Eosinophils Absolute: 0 10*3/uL (ref 0–0.7)
HEMATOCRIT: 31.9 % — AB (ref 40.0–52.0)
HEMOGLOBIN: 10.9 g/dL — AB (ref 13.0–18.0)
LYMPHS ABS: 0.9 10*3/uL — AB (ref 1.0–3.6)
LYMPHS PCT: 16 %
MCH: 30 pg (ref 26.0–34.0)
MCHC: 34.1 g/dL (ref 32.0–36.0)
MCV: 88.1 fL (ref 80.0–100.0)
MONO ABS: 0.6 10*3/uL (ref 0.2–1.0)
Monocytes Relative: 12 %
Neutro Abs: 3.8 10*3/uL (ref 1.4–6.5)
Neutrophils Relative %: 72 %
Platelets: 217 10*3/uL (ref 150–440)
RBC: 3.62 MIL/uL — ABNORMAL LOW (ref 4.40–5.90)
RDW: 13.6 % (ref 11.5–14.5)
WBC: 5.3 10*3/uL (ref 3.8–10.6)

## 2015-05-28 LAB — BASIC METABOLIC PANEL
ANION GAP: 5 (ref 5–15)
BUN: 13 mg/dL (ref 6–20)
CHLORIDE: 104 mmol/L (ref 101–111)
CO2: 26 mmol/L (ref 22–32)
Calcium: 8.9 mg/dL (ref 8.9–10.3)
Creatinine, Ser: 0.9 mg/dL (ref 0.61–1.24)
GFR calc non Af Amer: >60 mL/min (ref >60)
Glucose, Bld: 99 mg/dL (ref 65–99)
POTASSIUM: 3.6 mmol/L (ref 3.5–5.1)
Sodium: 135 mmol/L (ref 135–145)

## 2015-05-28 NOTE — Progress Notes (Signed)
Patient here today for follow up Lung cancer and results. States his appetite is better but does have difficulty swallowing meat and bread. Denies any pain.

## 2015-05-28 NOTE — Progress Notes (Signed)
[facility] OFFICE PROGRESS NOTE  Patient Care Team: [name], MD as PCP - General (Internal Medicine)   SUMMARY OF ONCOLOGIC HISTORY:  # April 2016- SQUAMOUS CELL of LUNG ca [RUL] STAGE III; [EBUS-Right hilum];  (? Stage IV Left lung nodule- not Bx)    # Schizophrenia/ group home resident  INTERVAL HISTORY: patient is a poor historian given schizophrenia/accompanied by caregiver.  This is my first interaction with the patient since I joined the practice September 2016. I reviewed the patient's prior charts/pertinent labs/imaging in detail; findings are summarized above.   A pleasant 60 year old male patient with above history of schizophrenia who is a group home resident- is accompanied by his caregiver to review the results of his restaging CAT scan done for his stage III lung cancer.  Patient states his appetite is improving. Patient had difficulty swallowing which is currently improving. No nausea no vomiting. His weight is again improving.   Patient denies any chest pain or shortness of breath or cough or hemoptysis.    REVIEW OF SYSTEMS: difficult to assess given his poor history giving skills.  PAST MEDICAL HISTORY :  Past Medical History  Diagnosis Date  . Schizophrenia ([place])   . Diabetes mellitus without complication ([place])   . Hypertension   . Hyperlipemia   . Chronic fatigue   . 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 Hypothyroidism   . Chronic fatigue   . Colon polyps     PAST SURGICAL HISTORY :   Past Surgical History  Procedure Laterality Date  . Colonoscopy      history of colon polyps  . Peripheral vascular catheterization N/A 08/21/2014    Procedure: Glori Luis Cath Insertion;  Surgeon: Algernon Huxley, MD;  Location: [facility];  Service: Cardiovascular;  Laterality: N/A;  . Endobronchial ultrasound  07/30/2014    right hilum FNA/EBUS    FAMILY HISTORY :  History reviewed. No pertinent family history.  SOCIAL  HISTORY:   Social History  Substance Use Topics  . Smoking status: Former Smoker -- 0.25 packs/day for 42 years    Types: Cigarettes  . Smokeless tobacco: Never Used  . Alcohol Use: No    ALLERGIES:  has No Known Allergies.  MEDICATIONS:  Current Outpatient Prescriptions  Medication Sig Dispense Refill  . atorvastatin (LIPITOR) 10 MG tablet Take 10 mg by mouth every morning.     . calcium carbonate (TUMS - DOSED IN MG ELEMENTAL CALCIUM) 500 MG chewable tablet Chew 1 tablet by mouth 2 (two) times daily.    . ferrous sulfate 325 (65 FE) MG tablet Take 325 mg by mouth daily with breakfast.    . levothyroxine (SYNTHROID, LEVOTHROID) 88 MCG tablet Take 88 mcg by mouth every morning.     . nystatin (MYCOSTATIN) 100000 UNIT/ML suspension Take 5 ml up to 4 times a day as needed for discomfort in throat. ( swish and swallow). 480 mL 1  . OLANZapine (ZYPREXA) 20 MG tablet Take 30 mg by mouth at bedtime.     . ondansetron (ZOFRAN) 4 MG tablet Take 1 tablet (4 mg total) by mouth every 6 (six) hours as needed for nausea or vomiting. 20 tablet 1  . ondansetron (ZOFRAN) 4 MG tablet Take 1 tablet (4 mg total) by mouth daily as needed for nausea or vomiting. 20 tablet 1  . prochlorperazine (COMPAZINE) 10 MG tablet Take 10 mg by mouth every 6 (six) hours as needed for nausea or vomiting.    Marland Kitchen  propranolol (INDERAL) 10 MG tablet Take 10 mg by mouth 3 (three) times daily.    . sucralfate (CARAFATE) 1 G tablet DISSOLVE 1 TABLET IN 2 TO 3 TABLESPOONSFUL OF WARM WATER AND SWALLOW THREE TIMES DAILY 84 tablet 0  . Vitamin D, Ergocalciferol, (DRISDOL) 50000 UNITS CAPS capsule Take 50,000 Units by mouth every 7 (seven) days. On Thursday     No current facility-administered medications for this visit.    PHYSICAL EXAMINATION: ECOG PERFORMANCE STATUS: 0 - Asymptomatic  BP 129/82 mmHg  Pulse 72  Temp(Src) 96.2 F (35.7 C)  Resp 18  Ht 6' (1.829 m)  Wt 182 lb 8.7 oz (82.8 kg)  BMI 24.75 kg/m2  SpO2  98%  Filed Weights   05/28/15 0925  Weight: 182 lb 8.7 oz (82.8 kg)    GENERAL: Well-nourished well-developed; Alert, no distress and comfortable.  Accompanied by caregiver. EYES: no pallor or icterus OROPHARYNX: no thrush or ulceration; dentures. NECK: supple, no masses felt LYMPH:  no palpable lymphadenopathy in the cervical, axillary or inguinal regions LUNGS: decreased air entry bilaterally  No wheeze or crackles HEART/CVS: regular rate & rhythm and no murmurs; No lower extremity edema ABDOMEN:abdomen soft, non-tender and normal bowel sounds Musculoskeletal:no cyanosis of digits and no clubbing  PSYCH: alert & oriented x 3 ; slow speech; flat affect. NEURO: no focal motor/sensory deficits SKIN:  no rashes or significant lesions  LABORATORY DATA:  I have reviewed the data as listed    Component Value Date/Time   NA 135 05/28/2015 0901   NA 135 07/28/2014 1121   K 3.6 05/28/2015 0901   K 4.3 07/28/2014 1121   CL 104 05/28/2015 0901   CL 101 07/28/2014 1121   CO2 26 05/28/2015 0901   CO2 25 07/28/2014 1121   GLUCOSE 99 05/28/2015 0901   GLUCOSE 135* 07/28/2014 1121   BUN 13 05/28/2015 0901   BUN 19 07/28/2014 1121   CREATININE 0.90 05/28/2015 0901   CREATININE 1.36* 07/28/2014 1121   CALCIUM 8.9 05/28/2015 0901   CALCIUM 9.1 07/28/2014 1121   PROT 7.3 04/07/2015 0852   PROT 7.6 07/23/2014 1042   ALBUMIN 3.8 04/07/2015 0852   ALBUMIN 4.2 07/23/2014 1042   AST 17 04/07/2015 0852   AST 24 07/23/2014 1042   ALT 10* 04/07/2015 0852   ALT 24 07/23/2014 1042   ALKPHOS 91 04/07/2015 0852   ALKPHOS 108 07/23/2014 1042   BILITOT 1.0 04/07/2015 0852   BILITOT 0.5 07/23/2014 1042   GFRNONAA >60 05/28/2015 0901   GFRNONAA 57* 07/28/2014 1121   GFRAA >60 05/28/2015 0901   GFRAA >60 07/28/2014 1121    No results found for: SPEP, UPEP  Lab Results  Component Value Date   WBC 5.3 05/28/2015   NEUTROABS 3.8 05/28/2015   HGB 10.9* 05/28/2015   HCT 31.9* 05/28/2015    MCV 88.1 05/28/2015   PLT 217 05/28/2015      Chemistry      Component Value Date/Time   NA 135 05/28/2015 0901   NA 135 07/28/2014 1121   K 3.6 05/28/2015 0901   K 4.3 07/28/2014 1121   CL 104 05/28/2015 0901   CL 101 07/28/2014 1121   CO2 26 05/28/2015 0901   CO2 25 07/28/2014 1121   BUN 13 05/28/2015 0901   BUN 19 07/28/2014 1121   CREATININE 0.90 05/28/2015 0901   CREATININE 1.36* 07/28/2014 1121      Component Value Date/Time   CALCIUM 8.9 05/28/2015 0901   CALCIUM 9.1  07/28/2014 1121   ALKPHOS 91 04/07/2015 0852   ALKPHOS 108 07/23/2014 1042   AST 17 04/07/2015 0852   AST 24 07/23/2014 1042   ALT 10* 04/07/2015 0852   ALT 24 07/23/2014 1042   BILITOT 1.0 04/07/2015 0852   BILITOT 0.5 07/23/2014 1042       RADIOGRAPHIC STUDIES: I have personally reviewed the radiological images as listed and agreed with the findings in the report. No results found.   ASSESSMENT & PLAN:   # SQUAMOUS Cell LUNG CA- STAGE IIIB-  Status post chemoradiation [ finished July August 2016].   CT scan February 2017 shows-  Consolidative changes in the right upper lung;  However  Slightly worsening left upper lobe lung nodule.I reviewed the images myself reviewed the images with the caregiver.  #  Left upper lobe lung nodule-  9 mm in size on the most recent CT scan/ this is increasing compared to a prior scan in  November 2016.  I suspect this is malignancy.  I recommend a PET scan for further evaluation/ also review of the radiology at the  [facility].   #  The treatment options. I would be based upon the PET scan. Discussed at length with the patient/ caregiver.  #  Schizophrenia- stable.  # 25 minutes face-to-face with the patient discussing the above plan of care; more than 50% of time spent on prognosis/ natural history; counseling and coordination.      [name], MD 05/28/2015 9:36 AM

## 2015-06-02 ENCOUNTER — Ambulatory Visit
Admission: RE | Admit: 2015-06-02 | Discharge: 2015-06-02 | Disposition: A | Discharge status: Home / Self Care | Payer: Medicare Other | Source: Ambulatory Visit | Attending: Internal Medicine | Admitting: Internal Medicine

## 2015-06-02 DIAGNOSIS — C3491 Malignant neoplasm of unspecified part of right bronchus or lung: Secondary | ICD-10-CM

## 2015-06-02 DIAGNOSIS — R911 Solitary pulmonary nodule: Secondary | ICD-10-CM

## 2015-06-05 ENCOUNTER — Ambulatory Visit: Payer: Medicare Other | Admitting: Internal Medicine

## 2015-06-08 ENCOUNTER — Ambulatory Visit
Admission: RE | Admit: 2015-06-08 | Discharge: 2015-06-08 | Disposition: A | Discharge status: Home / Self Care | Payer: Medicare Other | Source: Ambulatory Visit | Attending: Internal Medicine | Admitting: Internal Medicine

## 2015-06-08 DIAGNOSIS — J439 Emphysema, unspecified: Secondary | ICD-10-CM | POA: Diagnosis not present

## 2015-06-08 DIAGNOSIS — C3491 Malignant neoplasm of unspecified part of right bronchus or lung: Secondary | ICD-10-CM | POA: Diagnosis present

## 2015-06-08 DIAGNOSIS — R911 Solitary pulmonary nodule: Secondary | ICD-10-CM | POA: Insufficient documentation

## 2015-06-08 DIAGNOSIS — Z9221 Personal history of antineoplastic chemotherapy: Secondary | ICD-10-CM | POA: Diagnosis not present

## 2015-06-08 LAB — GLUCOSE, CAPILLARY: GLUCOSE-CAPILLARY: 79 mg/dL (ref 65–99)

## 2015-06-08 MED ORDER — FLUDEOXYGLUCOSE F - 18 (FDG) INJECTION
12.2600 | Freq: Once | INTRAVENOUS | Status: AC | PRN
  Administered 2015-06-08: 12.26 via INTRAVENOUS

## 2015-06-10 ENCOUNTER — Inpatient Hospital Stay (HOSPITAL_BASED_OUTPATIENT_CLINIC_OR_DEPARTMENT_OTHER): Payer: Medicare Other | Admitting: Internal Medicine

## 2015-06-10 ENCOUNTER — Encounter: Payer: Self-pay | Admitting: *Deleted

## 2015-06-10 ENCOUNTER — Inpatient Hospital Stay: Payer: Medicare Other

## 2015-06-10 VITALS — BP 150/92 | HR 62 | Temp 95.4°F | Resp 18 | Ht 72.0 in | Wt 184.5 lb

## 2015-06-10 DIAGNOSIS — I700 Atherosclerosis of aorta: Secondary | ICD-10-CM

## 2015-06-10 DIAGNOSIS — C3411 Malignant neoplasm of upper lobe, right bronchus or lung: Secondary | ICD-10-CM | POA: Diagnosis not present

## 2015-06-10 DIAGNOSIS — J439 Emphysema, unspecified: Secondary | ICD-10-CM

## 2015-06-10 DIAGNOSIS — Z9221 Personal history of antineoplastic chemotherapy: Secondary | ICD-10-CM | POA: Diagnosis not present

## 2015-06-10 DIAGNOSIS — R911 Solitary pulmonary nodule: Secondary | ICD-10-CM

## 2015-06-10 DIAGNOSIS — R5382 Chronic fatigue, unspecified: Secondary | ICD-10-CM

## 2015-06-10 DIAGNOSIS — F209 Schizophrenia, unspecified: Secondary | ICD-10-CM

## 2015-06-10 DIAGNOSIS — R1310 Dysphagia, unspecified: Secondary | ICD-10-CM

## 2015-06-10 DIAGNOSIS — Z87891 Personal history of nicotine dependence: Secondary | ICD-10-CM

## 2015-06-10 DIAGNOSIS — E119 Type 2 diabetes mellitus without complications: Secondary | ICD-10-CM

## 2015-06-10 DIAGNOSIS — C3491 Malignant neoplasm of unspecified part of right bronchus or lung: Secondary | ICD-10-CM

## 2015-06-10 DIAGNOSIS — Z923 Personal history of irradiation: Secondary | ICD-10-CM | POA: Diagnosis not present

## 2015-06-10 DIAGNOSIS — I10 Essential (primary) hypertension: Secondary | ICD-10-CM

## 2015-06-10 LAB — COMPREHENSIVE METABOLIC PANEL
ALT: 12 U/L — ABNORMAL LOW (ref 17–63)
ANION GAP: 5 (ref 5–15)
AST: 16 U/L (ref 15–41)
Albumin: 3.8 g/dL (ref 3.5–5.0)
Alkaline Phosphatase: 89 U/L (ref 38–126)
BUN: 9 mg/dL (ref 6–20)
CHLORIDE: 104 mmol/L (ref 101–111)
CO2: 26 mmol/L (ref 22–32)
Calcium: 9.2 mg/dL (ref 8.9–10.3)
Creatinine, Ser: 0.82 mg/dL (ref 0.61–1.24)
Glucose, Bld: 92 mg/dL (ref 65–99)
POTASSIUM: 4.1 mmol/L (ref 3.5–5.1)
Sodium: 135 mmol/L (ref 135–145)
Total Bilirubin: 0.4 mg/dL (ref 0.3–1.2)
Total Protein: 6.9 g/dL (ref 6.5–8.1)

## 2015-06-10 LAB — CBC WITH DIFFERENTIAL/PLATELET
BASOS PCT: 0 %
Basophils Absolute: 0 10*3/uL (ref 0–0.1)
EOS PCT: 0 %
Eosinophils Absolute: 0 10*3/uL (ref 0–0.7)
HCT: 34.6 % — ABNORMAL LOW (ref 40.0–52.0)
Hemoglobin: 11.7 g/dL — ABNORMAL LOW (ref 13.0–18.0)
LYMPHS PCT: 21 %
Lymphs Abs: 1.2 10*3/uL (ref 1.0–3.6)
MCH: 29.8 pg (ref 26.0–34.0)
MCHC: 33.7 g/dL (ref 32.0–36.0)
MCV: 88.7 fL (ref 80.0–100.0)
MONO ABS: 0.6 10*3/uL (ref 0.2–1.0)
MONOS PCT: 11 %
NEUTROS ABS: 3.8 10*3/uL (ref 1.4–6.5)
Neutrophils Relative %: 68 %
PLATELETS: 348 10*3/uL (ref 150–440)
RBC: 3.91 MIL/uL — ABNORMAL LOW (ref 4.40–5.90)
RDW: 13.5 % (ref 11.5–14.5)
WBC: 5.6 10*3/uL (ref 3.8–10.6)

## 2015-06-10 LAB — TSH: TSH: 1.504 u[IU]/mL (ref 0.350–4.500)

## 2015-06-10 NOTE — Progress Notes (Signed)
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 Stage IV Left lung nodule- not Bx)   # FEB 2017- PET Recurrence/Progression-Feb 2017- START Tecentriq q 3W  # Schizophrenia/ group home resident  INTERVAL HISTORY: patient is a poor historian given schizophrenia/accompanied by caregiver.  A pleasant 60 year old male patient with above history of schizophrenia who is a group home resident- is accompanied by his caregiver to review the results of his restaging PET scan done for his stage III lung cancer- after his surveillance CT scan showed abnormalities.  As per the caregiver- overall patient is feeling okay no chest pain or shortness of breath or hemoptysis.  No nausea no vomiting. His weight is again improving.   Patient denies any chest pain or shortness of breath or cough or hemoptysis.    REVIEW OF SYSTEMS: difficult to assess given his poor history giving skills.  PAST MEDICAL HISTORY :  Past Medical History  Diagnosis Date  . Schizophrenia ([place])   . Diabetes mellitus without complication ([place])   . Hypertension   . Hyperlipemia   . Chronic fatigue   . Squamous cell carcinoma of right lung ([place]) 08/22/2014  . Hypothyroidism   . Chronic fatigue   . Colon polyps     PAST SURGICAL HISTORY :   Past Surgical History  Procedure Laterality Date  . Colonoscopy      history of colon polyps  . Peripheral vascular catheterization N/A 08/21/2014    Procedure: Glori Luis Cath Insertion;  Surgeon: Algernon Huxley, MD;  Location: [facility];  Service: Cardiovascular;  Laterality: N/A;  . Endobronchial ultrasound  07/30/2014    right hilum FNA/EBUS    FAMILY HISTORY :  No family history on file.  SOCIAL HISTORY:   Social History  Substance Use Topics  . Smoking status: Former Smoker -- 0.25  packs/day for 42 years    Types: Cigarettes  . Smokeless tobacco: Never Used  . Alcohol Use: No    ALLERGIES:  has No Known Allergies.  MEDICATIONS:  Current Outpatient Prescriptions  Medication Sig Dispense Refill  . atorvastatin (LIPITOR) 10 MG tablet Take 10 mg by mouth every morning.     . calcium carbonate (TUMS - DOSED IN MG ELEMENTAL CALCIUM) 500 MG chewable tablet Chew 1 tablet by mouth 2 (two) times daily.    . ferrous sulfate 325 (65 FE) MG tablet Take 325 mg by mouth daily with breakfast.    . levothyroxine (SYNTHROID, LEVOTHROID) 88 MCG tablet Take 88 mcg by mouth every morning.     . nystatin (MYCOSTATIN) 100000 UNIT/ML suspension Take 5 ml up to 4 times a day as needed for discomfort in throat. ( swish and swallow). 480 mL 1  . OLANZapine (ZYPREXA) 20 MG tablet Take 30 mg by mouth at bedtime.     . prochlorperazine (COMPAZINE) 10 MG tablet Take 10 mg by mouth every 6 (six) hours as needed for nausea or vomiting.    . propranolol (INDERAL) 10 MG tablet Take 10 mg by mouth 3 (three) times daily.    . sucralfate (CARAFATE) 1 G tablet DISSOLVE 1 TABLET IN 2 TO 3 TABLESPOONSFUL OF WARM WATER AND SWALLOW THREE TIMES DAILY 84 tablet 0  . Vitamin D, Ergocalciferol, (DRISDOL) 50000 UNITS CAPS capsule Take 50,000 Units by mouth every 7 (  seven) days. On Thursday     No current facility-administered medications for this visit.    PHYSICAL EXAMINATION: ECOG PERFORMANCE STATUS: 0 - Asymptomatic  BP 150/92 mmHg  Pulse 62  Temp(Src) 95.4 F (35.2 C) (Tympanic)  Resp 18  Ht 6' (1.829 m)  Wt 184 lb 8.4 oz (83.7 kg)  BMI 25.02 kg/m2  Filed Weights   06/10/15 0836  Weight: 184 lb 8.4 oz (83.7 kg)    GENERAL: Well-nourished well-developed; Alert, no distress and comfortable.  Accompanied by caregiver. EYES: no pallor or icterus OROPHARYNX: no thrush or ulceration; dentures. NECK: supple, no masses felt LYMPH:  no palpable lymphadenopathy in the cervical, axillary or inguinal  regions LUNGS: decreased air entry bilaterally  No wheeze or crackles HEART/CVS: regular rate & rhythm and no murmurs; No lower extremity edema ABDOMEN:abdomen soft, non-tender and normal bowel sounds Musculoskeletal:no cyanosis of digits and no clubbing  PSYCH: alert & oriented x 3 ; slow speech; flat affect. NEURO: no focal motor/sensory deficits SKIN:  no rashes or significant lesions  LABORATORY DATA:  I have reviewed the data as listed    Component Value Date/Time   NA 135 05/28/2015 0901   NA 135 07/28/2014 1121   K 3.6 05/28/2015 0901   K 4.3 07/28/2014 1121   CL 104 05/28/2015 0901   CL 101 07/28/2014 1121   CO2 26 05/28/2015 0901   CO2 25 07/28/2014 1121   GLUCOSE 99 05/28/2015 0901   GLUCOSE 135* 07/28/2014 1121   BUN 13 05/28/2015 0901   BUN 19 07/28/2014 1121   CREATININE 0.90 05/28/2015 0901   CREATININE 1.36* 07/28/2014 1121   CALCIUM 8.9 05/28/2015 0901   CALCIUM 9.1 07/28/2014 1121   PROT 7.3 04/07/2015 0852   PROT 7.6 07/23/2014 1042   ALBUMIN 3.8 04/07/2015 0852   ALBUMIN 4.2 07/23/2014 1042   AST 17 04/07/2015 0852   AST 24 07/23/2014 1042   ALT 10* 04/07/2015 0852   ALT 24 07/23/2014 1042   ALKPHOS 91 04/07/2015 0852   ALKPHOS 108 07/23/2014 1042   BILITOT 1.0 04/07/2015 0852   BILITOT 0.5 07/23/2014 1042   GFRNONAA >60 05/28/2015 0901   GFRNONAA 57* 07/28/2014 1121   GFRAA >60 05/28/2015 0901   GFRAA >60 07/28/2014 1121    No results found for: SPEP, UPEP  Lab Results  Component Value Date   WBC 5.3 05/28/2015   NEUTROABS 3.8 05/28/2015   HGB 10.9* 05/28/2015   HCT 31.9* 05/28/2015   MCV 88.1 05/28/2015   PLT 217 05/28/2015      Chemistry      Component Value Date/Time   NA 135 05/28/2015 0901   NA 135 07/28/2014 1121   K 3.6 05/28/2015 0901   K 4.3 07/28/2014 1121   CL 104 05/28/2015 0901   CL 101 07/28/2014 1121   CO2 26 05/28/2015 0901   CO2 25 07/28/2014 1121   BUN 13 05/28/2015 0901   BUN 19 07/28/2014 1121   CREATININE  0.90 05/28/2015 0901   CREATININE 1.36* 07/28/2014 1121      Component Value Date/Time   CALCIUM 8.9 05/28/2015 0901   CALCIUM 9.1 07/28/2014 1121   ALKPHOS 91 04/07/2015 0852   ALKPHOS 108 07/23/2014 1042   AST 17 04/07/2015 0852   AST 24 07/23/2014 1042   ALT 10* 04/07/2015 0852   ALT 24 07/23/2014 1042   BILITOT 1.0 04/07/2015 0852   BILITOT 0.5 07/23/2014 1042       RADIOGRAPHIC STUDIES: I have personally  reviewed the radiological images as listed and agreed with the findings in the report. Nm Pet Image Restag (ps) Skull Base To Thigh  06/08/2015  CLINICAL DATA:  Subsequent treatment strategy for lung cancer. Lung nodule. EXAM: NUCLEAR MEDICINE PET SKULL BASE TO THIGH TECHNIQUE: 12.3 mCi F-18 FDG was injected intravenously. Full-ring PET imaging was performed from the skull base to thigh after the radiotracer. CT data was obtained and used for attenuation correction and anatomic localization. FASTING BLOOD GLUCOSE:  Value: 79 mg/dl COMPARISON:  Multiple exams, including 07/17/2014 FINDINGS: NECK No hypermetabolic lymph nodes in the neck. CHEST Left upper lobe nodule along a region of scarring measures 8 mm in diameter and has maximum standard uptake value of 2.0, previously 4.7. Area of consolidation posteriorly in the right upper lobe has a maximum standard uptake value of 4.6 and extends down to the right hilum. Confluent nodular right middle lobe opacities measure up to about 7 mm in thickness but are confluent in a 2.2 cm region, with maximum standard uptake value 6.6. Among the nodularity in the right lower lobe, along the medial region there is a 9 mm thick nodular structure with some surrounding ground-glass opacity, the whole region with a maximum standard uptake value of 7.0. There is some physiologic activity in the distal half of the esophagus. New 1.5 by 2.6 cm density medially in the left lower lobe along the hemidiaphragm is not appreciably hypermetabolic and probably from  atelectasis. Underlying emphysema noted. Small amount of anterior pericardial fluid. There is a disc shaped hyperdensity in the esophagus at about the same location but a similar density was present on 05/26/2015. Possibly this is a pill thick keeps getting stuck in the same location prior to dissolving. ABDOMEN/PELVIS No abnormal hypermetabolic activity within the liver, pancreas, adrenal glands, or spleen. No hypermetabolic lymph nodes in the abdomen or pelvis. Aortoiliac atherosclerotic vascular disease. SKELETON No focal hypermetabolic activity to suggest skeletal metastasis. Free osteochondral fragments or calcific tendinopathy along the left rotator cuff/joint capsule, image 65 series 3. IMPRESSION: 1. Two hypermetabolic regions are present at the right lung base, both with hypermetabolic activity seemingly disproportionate to the smaller areas of nodularity. One of these is in the right middle lobe or there is a confluence of nodularity inferiorly with maximum standard uptake value 6.6. There is also an area of nodularity and interstitial accentuation medially in the right lower lobe adjacent to the hemidiaphragm, maximum standard uptake value 7.0. These are both new. Atypical infectious process/active granulomatous disease such as tuberculosis could cause a similar appearance but malignancy and lymphangitic carcinomatosis is not excluded. 2. The nodule in the left upper lobe has relatively low activity at maximum standard uptake value 2.0, previously 4.7 back in March of 2016. 3. The area of consolidation posteriorly in the right upper lobe has a maximum standard uptake value of 4.6 and extends down to the hilum. Much of this may represent radiation pneumonitis. Previously the maximum SUV in this vicinity was 16.6. 4. No findings of metastatic disease in the neck, abdomen/pelvis, or skeleton. 5. Emphysema. Electronically Signed   By: Van Clines M.D.   On: 06/08/2015 13:10     ASSESSMENT & PLAN:    # SQUAMOUS Cell LUNG CA- STAGE IIIB-  Status post chemoradiation [ finished July August 2016]. PET scan February 2017 shows-  Consolidative changes in the right upper lung; and also shows left upper lobe uptake/right lower lobe uptake- concerning for recurrence/ Progression.  # Patient is not a good candidate  for chemotherapy/recommend immunotherapy  every Tecentriq.3 W. Check baseline TSH.   # The goal of therapy is palliative; and length of treatments are likely ongoing/based upon the results of the scans. Discussed the potential side effects of immunotherapy including but not limited to diarrhea; skin rash; elevated LFTs/endocrine abnormalities etc.   # I  reviewed the images myself-and the patient's caregiver/also review of the radiology at the  Tumor conference/ Discussed at length with the patient/ caregiver. Also discussed that biopsy might be recommended/await tumor conference recommendations.  #  Schizophrenia- stable.  # 40  minutes face-to-face with the patient discussing the above plan of care; more than 50% of time spent on prognosis/ natural history; counseling and coordination.      [name], MD 06/10/2015 9:09 AM

## 2015-06-10 NOTE — Progress Notes (Signed)
RN notified [facility] cancer center prior auth team that the Tecentriq was approved by insurance. Dr. Rogue Bussing made aware. At this time, he wants to wait to change the tx schedule from [facility] to Tecentriq until after cancer case conference.

## 2015-06-15 ENCOUNTER — Telehealth: Payer: Self-pay | Admitting: *Deleted

## 2015-06-15 ENCOUNTER — Telehealth: Payer: Self-pay | Admitting: Internal Medicine

## 2015-06-15 NOTE — Telephone Encounter (Signed)
Spoke to [facility] patient's caseworker regarding the change of plan; hold chemotherapy for now; planned radiation to the left upper lobe lung nodule. She agrees. Patient has appointment with Dr. Donella Stade tomorrow.

## 2015-06-15 NOTE — Telephone Encounter (Signed)
msg provided to cancer center scheduling to cnl the immunotherapy treatment on 3/1. Ref initiated to Dr. Massie Maroon for radiation therapy. Caregiver contact information provided to Dr. Rogue Bussing.  Dr. Rogue Bussing gave verbal order to cnl the lab/md/tx apt on 3/22.  Dr. Rogue Bussing would like to see the patient back in 2 weeks to followup with patient and re-discuss plan with patient.

## 2015-06-15 NOTE — Telephone Encounter (Signed)
-----   Message from [name], MD sent at 06/12/2015  5:28 PM EST ----- Plan to hold off tecentriq; and refer to Dr.Crystal for radiation. Please give me pt's/case workers number- i will have to call her to discuss this- Thanks

## 2015-06-16 ENCOUNTER — Encounter: Payer: Self-pay | Admitting: Radiation Oncology

## 2015-06-16 ENCOUNTER — Encounter: Payer: Self-pay | Admitting: *Deleted

## 2015-06-16 ENCOUNTER — Ambulatory Visit
Admission: RE | Admit: 2015-06-16 | Discharge: 2015-06-16 | Disposition: A | Discharge status: Home / Self Care | Payer: Medicare Other | Source: Ambulatory Visit | Attending: Radiation Oncology | Admitting: Radiation Oncology

## 2015-06-16 VITALS — BP 134/79 | HR 81 | Temp 97.0°F | Resp 18 | Wt 180.1 lb

## 2015-06-16 DIAGNOSIS — C3491 Malignant neoplasm of unspecified part of right bronchus or lung: Secondary | ICD-10-CM

## 2015-06-16 NOTE — Progress Notes (Signed)
Dr. Rogue Bussing spoke with Dr. Baruch Gouty. The patient will need to start on tecentriq tomorrow. MD gave v/o to r/s this apt. Msg sent and phone call left for cancer center scheduling to arrange for md/tecentriq tomorrow 06/17/15. I spoke with Maudie Mercury, RN in Radiation. She will ensure that the patient obtains his appointment for tommorrow before he leaves the radiation dept.

## 2015-06-16 NOTE — Progress Notes (Signed)
Radiation Oncology Follow up Note  Name: Travis Maddox   Date:   06/16/2015 MRN:  [id number] DOB: March 05, 1956    This 60 y.o. male presents to the clinic today for follow-up for stage IIIa carcinoma the right lung status post concurrent chemoradiation now out 7 months with new abnormal PET/CT findings.  REFERRING PROVIDER: [name], MD  HPI: Patient is a 60 year old male with significant cognitive disorder now out 7 months having completed concomitant chemoradiation for stage IIIa [place] cell carcinoma the right hilum.Marland Kitchen He has recently had a repeat PET CT scan showing 2 new hypermetabolic regions of the right lung base also a left upper lobe slightly hypermetabolic I area all consistent with metastatic disease. He also has an area of consolidation her previous treatment field which in my interpretation represent radiation pneumonitis. Patient is doing fairly well he is in no pain. He specifically denies cough hemoptysis or chest tightness. He is losing weight and looks somewhat anorexic.  COMPLICATIONS OF TREATMENT: none  FOLLOW UP COMPLIANCE: keeps appointments   PHYSICAL EXAM:  BP 134/79 mmHg  Pulse 81  Temp(Src) 97 F (36.1 C)  Resp 18  Wt 180 lb 1.9 oz (81.7 kg) Thin male in NAD. No cervical or supra clavicular adenopathy is appreciated. Well-developed well-nourished patient in NAD. HEENT reveals PERLA, EOMI, discs not visualized.  Oral cavity is clear. No oral mucosal lesions are identified. Neck is clear without evidence of cervical or supraclavicular adenopathy. Lungs are clear to A&P. Cardiac examination is essentially unremarkable with regular rate and rhythm without murmur rub or thrill. Abdomen is benign with no organomegaly or masses noted. Motor sensory and DTR levels are equal and symmetric in the upper and lower extremities. Cranial nerves II through XII are grossly intact. Proprioception is intact. No peripheral adenopathy or edema is identified. No motor or sensory  levels are noted. Crude visual fields are within normal range.  RADIOLOGY RESULTS: Recent PET CT scan is reviewed and compatible with the above-stated findings.  PLAN: I discussed the case with medical oncology. Based on the multiple areas of new hypermetabolic activity in the right lung base and left upper lobe do not feel radiation therapy at this point is indicated. Recommendation has been made forrecommend immunotherapy every Tecentriq.3 W. I agree with this treatment plan at this time. Would offer palliative radiation therapy should any of these lesions need palliative treatment to prevent atelectasis of the lung, prevent hemoptysis or any other areas of palliation. We have made a follow-up appointment with medical oncology. We'll follow-up accordingly.  I would like to take this opportunity for allowing me to participate in the care of your patient.Armstead Peaks., MD

## 2015-06-17 ENCOUNTER — Inpatient Hospital Stay: Payer: Medicare Other

## 2015-06-17 ENCOUNTER — Inpatient Hospital Stay: Payer: Medicare Other | Attending: Internal Medicine | Admitting: Internal Medicine

## 2015-06-17 VITALS — BP 145/91 | HR 67 | Temp 97.8°F | Resp 18 | Ht 72.0 in | Wt 179.0 lb

## 2015-06-17 DIAGNOSIS — F209 Schizophrenia, unspecified: Secondary | ICD-10-CM

## 2015-06-17 DIAGNOSIS — R5382 Chronic fatigue, unspecified: Secondary | ICD-10-CM

## 2015-06-17 DIAGNOSIS — C3411 Malignant neoplasm of upper lobe, right bronchus or lung: Secondary | ICD-10-CM | POA: Diagnosis not present

## 2015-06-17 DIAGNOSIS — E039 Hypothyroidism, unspecified: Secondary | ICD-10-CM

## 2015-06-17 DIAGNOSIS — R197 Diarrhea, unspecified: Secondary | ICD-10-CM | POA: Diagnosis not present

## 2015-06-17 DIAGNOSIS — E785 Hyperlipidemia, unspecified: Secondary | ICD-10-CM | POA: Diagnosis not present

## 2015-06-17 DIAGNOSIS — I10 Essential (primary) hypertension: Secondary | ICD-10-CM

## 2015-06-17 DIAGNOSIS — Z5111 Encounter for antineoplastic chemotherapy: Secondary | ICD-10-CM | POA: Insufficient documentation

## 2015-06-17 DIAGNOSIS — R918 Other nonspecific abnormal finding of lung field: Secondary | ICD-10-CM | POA: Diagnosis not present

## 2015-06-17 DIAGNOSIS — E119 Type 2 diabetes mellitus without complications: Secondary | ICD-10-CM

## 2015-06-17 DIAGNOSIS — Z87891 Personal history of nicotine dependence: Secondary | ICD-10-CM | POA: Diagnosis not present

## 2015-06-17 DIAGNOSIS — C3491 Malignant neoplasm of unspecified part of right bronchus or lung: Secondary | ICD-10-CM

## 2015-06-17 DIAGNOSIS — Z79899 Other long term (current) drug therapy: Secondary | ICD-10-CM

## 2015-06-17 MED ORDER — SODIUM CHLORIDE 0.9 % IV SOLN
Freq: Once | INTRAVENOUS | Status: AC
  Administered 2015-06-17: 10:00:00 via INTRAVENOUS
  Filled 2015-06-17: qty 1000

## 2015-06-17 MED ORDER — SODIUM CHLORIDE 0.9 % IV SOLN
1200.0000 mg | Freq: Once | INTRAVENOUS | Status: AC
  Administered 2015-06-17: 1200 mg via INTRAVENOUS
  Filled 2015-06-17: qty 20

## 2015-06-17 NOTE — Progress Notes (Signed)
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 Stage IV Left lung nodule- not Bx)   # FEB 2017-STAGE IV [contralateral lung nodules] PET Recurrence/Progression-MARCH 2017- START Tecentriq q 3W  # Schizophrenia/ group home resident  INTERVAL HISTORY: patient is a poor historian given schizophrenia/accompanied by caregiver.  A pleasant 60 year old male patient with above history of schizophrenia who is a group home resident- is accompanied by his caregiver to review the  Return plan/ proceed with immunotherapy.   patient in the interim met with Dr. Donella Stade radiation oncology.   As per the caregiver- overall patient is feeling okay no chest pain or shortness of breath or hemoptysis.  No nausea no vomiting.  His weight is  Slightly lower than baseline.   Patient denies any chest pain or cough or hemoptysis.  No headaches vision changes.   REVIEW OF SYSTEMS: difficult to assess given his poor history giving skills.  PAST MEDICAL HISTORY :  Past Medical History  Diagnosis Date  . Schizophrenia ([place])   . Diabetes mellitus without complication ([place])   . Hypertension   . Hyperlipemia   . Chronic fatigue   . Squamous cell carcinoma of right lung ([place]) 08/22/2014  . Hypothyroidism   . Chronic fatigue   . Colon polyps     PAST SURGICAL HISTORY :   Past Surgical History  Procedure Laterality Date  . Colonoscopy      history of colon polyps  . Peripheral vascular catheterization N/A 08/21/2014    Procedure: Glori Luis Cath Insertion;  Surgeon: Algernon Huxley, MD;  Location: [place];  Service: Cardiovascular;  Laterality: N/A;  . Endobronchial ultrasound  07/30/2014    right hilum FNA/EBUS    FAMILY HISTORY :  No family history on file.  SOCIAL HISTORY:   Social History  Substance Use  Topics  . Smoking status: Former Smoker -- 0.25 packs/day for 42 years    Types: Cigarettes  . Smokeless tobacco: Never Used  . Alcohol Use: No    ALLERGIES:  has No Known Allergies.  MEDICATIONS:  Current Outpatient Prescriptions  Medication Sig Dispense Refill  . atorvastatin (LIPITOR) 10 MG tablet Take 10 mg by mouth every morning.     . calcium carbonate (TUMS - DOSED IN MG ELEMENTAL CALCIUM) 500 MG chewable tablet Chew 1 tablet by mouth 2 (two) times daily.    . ferrous sulfate 325 (65 FE) MG tablet Take 325 mg by mouth daily with breakfast.    . levothyroxine (SYNTHROID, LEVOTHROID) 88 MCG tablet Take 88 mcg by mouth every morning.     . nystatin (MYCOSTATIN) 100000 UNIT/ML suspension Take 5 ml up to 4 times a day as needed for discomfort in throat. ( swish and swallow). 480 mL 1  . OLANZapine (ZYPREXA) 20 MG tablet Take 30 mg by mouth at bedtime.     . prochlorperazine (COMPAZINE) 10 MG tablet Take 10 mg by mouth every 6 (six) hours as needed for nausea or vomiting.    . propranolol (INDERAL) 10 MG tablet Take 10 mg by mouth 3 (three) times daily.    . Vitamin D, Ergocalciferol, (DRISDOL) 50000 UNITS CAPS capsule Take 50,000 Units by mouth every 7 (seven) days. On Thursday     No current facility-administered medications for this visit.    PHYSICAL  EXAMINATION: ECOG PERFORMANCE STATUS: 0 - Asymptomatic  BP 145/91 mmHg  Pulse 67  Temp(Src) 97.8 F (36.6 C) (Tympanic)  Resp 18  Ht 6' (1.829 m)  Wt 179 lb (81.194 kg)  BMI 24.27 kg/m2  Filed Weights   06/17/15 0850  Weight: 179 lb (81.194 kg)    GENERAL: Well-nourished well-developed; Alert, no distress and comfortable.  Accompanied by caregiver. EYES: no pallor or icterus OROPHARYNX: no thrush or ulceration; dentures. NECK: supple, no masses felt LYMPH:  no palpable lymphadenopathy in the cervical, axillary or inguinal regions LUNGS: decreased air entry bilaterally  No wheeze or crackles HEART/CVS: regular rate &  rhythm and no murmurs; No lower extremity edema ABDOMEN:abdomen soft, non-tender and normal bowel sounds Musculoskeletal:no cyanosis of digits and no clubbing  PSYCH: alert & oriented x 3 ; slow speech; flat affect. NEURO: no focal motor/sensory deficits SKIN:  no rashes or significant lesions  LABORATORY DATA:  I have reviewed the data as listed    Component Value Date/Time   NA 135 06/10/2015 0911   NA 135 07/28/2014 1121   K 4.1 06/10/2015 0911   K 4.3 07/28/2014 1121   CL 104 06/10/2015 0911   CL 101 07/28/2014 1121   CO2 26 06/10/2015 0911   CO2 25 07/28/2014 1121   GLUCOSE 92 06/10/2015 0911   GLUCOSE 135* 07/28/2014 1121   BUN 9 06/10/2015 0911   BUN 19 07/28/2014 1121   CREATININE 0.82 06/10/2015 0911   CREATININE 1.36* 07/28/2014 1121   CALCIUM 9.2 06/10/2015 0911   CALCIUM 9.1 07/28/2014 1121   PROT 6.9 06/10/2015 0911   PROT 7.6 07/23/2014 1042   ALBUMIN 3.8 06/10/2015 0911   ALBUMIN 4.2 07/23/2014 1042   AST 16 06/10/2015 0911   AST 24 07/23/2014 1042   ALT 12* 06/10/2015 0911   ALT 24 07/23/2014 1042   ALKPHOS 89 06/10/2015 0911   ALKPHOS 108 07/23/2014 1042   BILITOT 0.4 06/10/2015 0911   BILITOT 0.5 07/23/2014 1042   GFRNONAA >60 06/10/2015 0911   GFRNONAA 57* 07/28/2014 1121   GFRAA >60 06/10/2015 0911   GFRAA >60 07/28/2014 1121    No results found for: SPEP, UPEP  Lab Results  Component Value Date   WBC 5.6 06/10/2015   NEUTROABS 3.8 06/10/2015   HGB 11.7* 06/10/2015   HCT 34.6* 06/10/2015   MCV 88.7 06/10/2015   PLT 348 06/10/2015      Chemistry      Component Value Date/Time   NA 135 06/10/2015 0911   NA 135 07/28/2014 1121   K 4.1 06/10/2015 0911   K 4.3 07/28/2014 1121   CL 104 06/10/2015 0911   CL 101 07/28/2014 1121   CO2 26 06/10/2015 0911   CO2 25 07/28/2014 1121   BUN 9 06/10/2015 0911   BUN 19 07/28/2014 1121   CREATININE 0.82 06/10/2015 0911   CREATININE 1.36* 07/28/2014 1121      Component Value Date/Time    CALCIUM 9.2 06/10/2015 0911   CALCIUM 9.1 07/28/2014 1121   ALKPHOS 89 06/10/2015 0911   ALKPHOS 108 07/23/2014 1042   AST 16 06/10/2015 0911   AST 24 07/23/2014 1042   ALT 12* 06/10/2015 0911   ALT 24 07/23/2014 1042   BILITOT 0.4 06/10/2015 0911   BILITOT 0.5 07/23/2014 1042         ASSESSMENT & PLAN:   # SQUAMOUS Cell LUNG CA- STAGE IV- [ tumor nodule in the contralateral lung/ recurrence]; PET scan February 2017 shows-  Consolidative changes in the right upper lung; and also shows left upper lobe uptake/right lower lobe uptake- concerning for recurrence/ Progression.   # Patient is not a good candidate for chemotherapy/recommend immunotherapy  every Tecentriq.3 W;  Labs baseline including TSH unremarkable except for mild anemia. I spoke to  Dr. Donella Stade-  Who feels that radiation therapy could be recommended if patient is symptomatic from this lung lesion  In future.   #  I again discussed with the patient's caseworker that the goal of therapy is palliative; and length of treatments are likely ongoing/based upon the results of the scans. Discussed the potential side effects of immunotherapy including but not limited to diarrhea; skin rash; elevated LFTs/endocrine abnormalities etc.   #  Schizophrenia- stable.  #  Follow-up in 3 weeks with labs and/ infusion.        [name], MD 06/17/2015 9:33 AM

## 2015-06-29 ENCOUNTER — Inpatient Hospital Stay: Payer: Medicare Other | Admitting: Internal Medicine

## 2015-07-08 ENCOUNTER — Inpatient Hospital Stay: Payer: Medicare Other

## 2015-07-08 ENCOUNTER — Inpatient Hospital Stay (HOSPITAL_BASED_OUTPATIENT_CLINIC_OR_DEPARTMENT_OTHER): Payer: Medicare Other | Admitting: Internal Medicine

## 2015-07-08 ENCOUNTER — Other Ambulatory Visit: Payer: Self-pay | Admitting: Internal Medicine

## 2015-07-08 ENCOUNTER — Ambulatory Visit: Payer: Medicare Other

## 2015-07-08 ENCOUNTER — Other Ambulatory Visit: Payer: Medicare Other

## 2015-07-08 ENCOUNTER — Ambulatory Visit: Payer: Medicare Other | Admitting: Internal Medicine

## 2015-07-08 VITALS — BP 156/85 | HR 60 | Temp 97.2°F | Ht 72.0 in | Wt 183.4 lb

## 2015-07-08 DIAGNOSIS — R197 Diarrhea, unspecified: Secondary | ICD-10-CM

## 2015-07-08 DIAGNOSIS — Z5111 Encounter for antineoplastic chemotherapy: Secondary | ICD-10-CM | POA: Diagnosis not present

## 2015-07-08 DIAGNOSIS — Z79899 Other long term (current) drug therapy: Secondary | ICD-10-CM

## 2015-07-08 DIAGNOSIS — Z87891 Personal history of nicotine dependence: Secondary | ICD-10-CM

## 2015-07-08 DIAGNOSIS — F209 Schizophrenia, unspecified: Secondary | ICD-10-CM

## 2015-07-08 DIAGNOSIS — C3411 Malignant neoplasm of upper lobe, right bronchus or lung: Secondary | ICD-10-CM

## 2015-07-08 DIAGNOSIS — E119 Type 2 diabetes mellitus without complications: Secondary | ICD-10-CM

## 2015-07-08 DIAGNOSIS — C3491 Malignant neoplasm of unspecified part of right bronchus or lung: Secondary | ICD-10-CM

## 2015-07-08 DIAGNOSIS — E785 Hyperlipidemia, unspecified: Secondary | ICD-10-CM

## 2015-07-08 DIAGNOSIS — R5382 Chronic fatigue, unspecified: Secondary | ICD-10-CM | POA: Diagnosis not present

## 2015-07-08 DIAGNOSIS — E039 Hypothyroidism, unspecified: Secondary | ICD-10-CM

## 2015-07-08 DIAGNOSIS — I10 Essential (primary) hypertension: Secondary | ICD-10-CM

## 2015-07-08 LAB — COMPREHENSIVE METABOLIC PANEL
ALT: 15 U/L — ABNORMAL LOW (ref 17–63)
AST: 21 U/L (ref 15–41)
Albumin: 4 g/dL (ref 3.5–5.0)
Alkaline Phosphatase: 75 U/L (ref 38–126)
Anion gap: 4 — ABNORMAL LOW (ref 5–15)
BILIRUBIN TOTAL: 0.7 mg/dL (ref 0.3–1.2)
BUN: 12 mg/dL (ref 6–20)
CHLORIDE: 106 mmol/L (ref 101–111)
CO2: 26 mmol/L (ref 22–32)
Calcium: 9 mg/dL (ref 8.9–10.3)
Creatinine, Ser: 0.82 mg/dL (ref 0.61–1.24)
GFR calc Af Amer: >60 mL/min (ref >60)
GFR calc non Af Amer: >60 mL/min (ref >60)
GLUCOSE: 94 mg/dL (ref 65–99)
POTASSIUM: 4.1 mmol/L (ref 3.5–5.1)
Sodium: 136 mmol/L (ref 135–145)
TOTAL PROTEIN: 6.8 g/dL (ref 6.5–8.1)

## 2015-07-08 LAB — CBC WITH DIFFERENTIAL/PLATELET
Basophils Absolute: 0 10*3/uL (ref 0–0.1)
Basophils Relative: 0 %
EOS PCT: 0 %
Eosinophils Absolute: 0 10*3/uL (ref 0–0.7)
HCT: 34.2 % — ABNORMAL LOW (ref 40.0–52.0)
Hemoglobin: 11.7 g/dL — ABNORMAL LOW (ref 13.0–18.0)
LYMPHS ABS: 1.2 10*3/uL (ref 1.0–3.6)
LYMPHS PCT: 20 %
MCH: 30.2 pg (ref 26.0–34.0)
MCHC: 34.2 g/dL (ref 32.0–36.0)
MCV: 88.2 fL (ref 80.0–100.0)
MONO ABS: 0.6 10*3/uL (ref 0.2–1.0)
MONOS PCT: 10 %
Neutro Abs: 4.1 10*3/uL (ref 1.4–6.5)
Neutrophils Relative %: 70 %
PLATELETS: 234 10*3/uL (ref 150–440)
RBC: 3.88 MIL/uL — ABNORMAL LOW (ref 4.40–5.90)
RDW: 14 % (ref 11.5–14.5)
WBC: 5.9 10*3/uL (ref 3.8–10.6)

## 2015-07-08 MED ORDER — HEPARIN SOD (PORK) LOCK FLUSH 100 UNIT/ML IV SOLN
500.0000 [IU] | Freq: Once | INTRAVENOUS | Status: AC
  Administered 2015-07-08: 500 [IU] via INTRAVENOUS
  Filled 2015-07-08: qty 5

## 2015-07-08 MED ORDER — LORAZEPAM 2 MG/ML IJ SOLN
1.0000 mg | Freq: Once | INTRAMUSCULAR | Status: AC | PRN
  Administered 2015-07-08: 1 mg via INTRAVENOUS
  Filled 2015-07-08: qty 1

## 2015-07-08 MED ORDER — SODIUM CHLORIDE 0.9 % IV SOLN
Freq: Once | INTRAVENOUS | Status: AC
  Administered 2015-07-08: 10:00:00 via INTRAVENOUS
  Filled 2015-07-08: qty 1000

## 2015-07-08 MED ORDER — ONDANSETRON HCL 40 MG/20ML IJ SOLN
Freq: Once | INTRAMUSCULAR | Status: AC | PRN
Start: 2015-07-08 — End: 2015-07-08
  Administered 2015-07-08: 12:00:00 via INTRAVENOUS
  Filled 2015-07-08 (×2): qty 4

## 2015-07-08 MED ORDER — LORAZEPAM 0.5 MG PO TABS: 1.0000 mg | ORAL_TABLET | Freq: Once | ORAL | Status: DC | PRN

## 2015-07-08 MED ORDER — SODIUM CHLORIDE 0.9% FLUSH
10.0000 mL | INTRAVENOUS | Status: DC | PRN
  Administered 2015-07-08: 10 mL via INTRAVENOUS
  Filled 2015-07-08: qty 10

## 2015-07-08 MED ORDER — SODIUM CHLORIDE 0.9 % IV SOLN
1200.0000 mg | Freq: Once | INTRAVENOUS | Status: AC
  Administered 2015-07-08: 1200 mg via INTRAVENOUS
  Filled 2015-07-08: qty 20

## 2015-07-08 NOTE — Progress Notes (Signed)
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 Stage IV Left lung nodule- not Bx)   # FEB 2017-STAGE IV [contralateral lung nodules] PET Recurrence/Progression [high risk for [facility] 2017- START Tecentriq q 3W  # Schizophrenia/ group home resident  INTERVAL HISTORY: Travis Maddox is a poor historian given schizophrenia/accompanied by caregiver.  A pleasant 60 year old male Travis Maddox with above history of schizophrenia who is a group home resident- is accompanied by his care giver;  currently on immunotherapy for recurrent lung cancer is here for follow-up/proceed with cycle #2.   Travis Maddox had mild diarrhea 2-3 loose stools/ he did not have taking Imodium and resolved by itself. No skin rash.   As per the caregiver- overall Travis Maddox is feeling okay no chest pain or shortness of breath or hemoptysis.  No nausea no vomiting. Weight improving/appetite good by 4 pounds.   Travis Maddox denies any chest pain or cough or hemoptysis.  No headaches vision changes.  REVIEW OF SYSTEMS: difficult to assess given his poor history giving skills.  PAST MEDICAL HISTORY :  Past Medical History  Diagnosis Date  . Schizophrenia ([place])   . Diabetes mellitus without complication ([place])   . Hypertension   . Hyperlipemia   . Chronic fatigue   . Squamous cell carcinoma of right lung ([place]) 08/22/2014  . Hypothyroidism   . Chronic fatigue   . Colon polyps     PAST SURGICAL HISTORY :   Past Surgical History  Procedure Laterality Date  . Colonoscopy      history of colon polyps  . Peripheral vascular catheterization N/A 08/21/2014    Procedure: Glori Luis Cath Insertion;  Surgeon: Algernon Huxley, MD;  Location: [place] [facility] LAB;  Service: Cardiovascular;  Laterality: N/A;  . Endobronchial ultrasound  07/30/2014    right hilum  FNA/EBUS    FAMILY HISTORY :  No family history on file.  SOCIAL HISTORY:   Social History  Substance Use Topics  . Smoking status: Former Smoker -- 0.25 packs/day for 42 years    Types: Cigarettes  . Smokeless tobacco: Never Used  . Alcohol Use: No    ALLERGIES:  has No Known Allergies.  MEDICATIONS:  Current Outpatient Prescriptions  Medication Sig Dispense Refill  . atorvastatin (LIPITOR) 10 MG tablet Take 10 mg by mouth every morning.     . calcium carbonate (TUMS - DOSED IN MG ELEMENTAL CALCIUM) 500 MG chewable tablet Chew 1 tablet by mouth 2 (two) times daily.    . ferrous sulfate 325 (65 FE) MG tablet Take 325 mg by mouth daily with breakfast.    . levothyroxine (SYNTHROID, LEVOTHROID) 88 MCG tablet Take 88 mcg by mouth every morning.     . nystatin (MYCOSTATIN) 100000 UNIT/ML suspension Take 5 ml up to 4 times a day as needed for discomfort in throat. ( swish and swallow). 480 mL 1  . OLANZapine (ZYPREXA) 20 MG tablet Take 30 mg by mouth at bedtime.     . prochlorperazine (COMPAZINE) 10 MG tablet Take 10 mg by mouth every 6 (six) hours as needed for nausea or vomiting.    . propranolol (INDERAL) 10 MG tablet Take 10 mg by mouth 3 (three) times daily.    . Vitamin D, Ergocalciferol, (DRISDOL) 50000 UNITS CAPS capsule Take 50,000 Units by mouth every 7 (seven) days.  On Thursday     No current facility-administered medications for this visit.   Facility-Administered Medications Ordered in Other Visits  Medication Dose Route Frequency Provider Last Rate Last Dose  . heparin lock flush 100 unit/mL  500 Units Intravenous Once [name], MD      . sodium chloride flush (NS) 0.9 % injection 10 mL  10 mL Intravenous PRN [name], MD   10 mL at 07/08/15 0932    PHYSICAL EXAMINATION: ECOG PERFORMANCE STATUS: 0 - Asymptomatic  Ht 6' (1.829 m)  Wt 183 lb 6.8 oz (83.2 kg)  BMI 24.87 kg/m2  Filed Weights   07/08/15 0955  Weight: 183 lb 6.8 oz (83.2 kg)     GENERAL: Well-nourished well-developed; Alert, no distress and comfortable.  Accompanied by caregiver. EYES: no pallor or icterus OROPHARYNX: no thrush or ulceration; dentures. NECK: supple, no masses felt LYMPH:  no palpable lymphadenopathy in the cervical, axillary or inguinal regions LUNGS: decreased air entry bilaterally  No wheeze or crackles HEART/CVS: regular rate & rhythm and no murmurs; No lower extremity edema ABDOMEN:abdomen soft, non-tender and normal bowel sounds Musculoskeletal:no cyanosis of digits and no clubbing  PSYCH: alert & oriented x 3 ; slow speech; flat affect. NEURO: no focal motor/sensory deficits SKIN:  no rashes or significant lesions  LABORATORY DATA:  I have reviewed the data as listed    Component Value Date/Time   NA 136 07/08/2015 0929   NA 135 07/28/2014 1121   K 4.1 07/08/2015 0929   K 4.3 07/28/2014 1121   CL 106 07/08/2015 0929   CL 101 07/28/2014 1121   CO2 26 07/08/2015 0929   CO2 25 07/28/2014 1121   GLUCOSE 94 07/08/2015 0929   GLUCOSE 135* 07/28/2014 1121   BUN 12 07/08/2015 0929   BUN 19 07/28/2014 1121   CREATININE 0.82 07/08/2015 0929   CREATININE 1.36* 07/28/2014 1121   CALCIUM 9.0 07/08/2015 0929   CALCIUM 9.1 07/28/2014 1121   PROT 6.8 07/08/2015 0929   PROT 7.6 07/23/2014 1042   ALBUMIN 4.0 07/08/2015 0929   ALBUMIN 4.2 07/23/2014 1042   AST 21 07/08/2015 0929   AST 24 07/23/2014 1042   ALT 15* 07/08/2015 0929   ALT 24 07/23/2014 1042   ALKPHOS 75 07/08/2015 0929   ALKPHOS 108 07/23/2014 1042   BILITOT 0.7 07/08/2015 0929   BILITOT 0.5 07/23/2014 1042   GFRNONAA >60 07/08/2015 0929   GFRNONAA 57* 07/28/2014 1121   GFRAA >60 07/08/2015 0929   GFRAA >60 07/28/2014 1121    No results found for: SPEP, UPEP  Lab Results  Component Value Date   WBC 5.9 07/08/2015   NEUTROABS 4.1 07/08/2015   HGB 11.7* 07/08/2015   HCT 34.2* 07/08/2015   MCV 88.2 07/08/2015   PLT 234 07/08/2015      Chemistry       Component Value Date/Time   NA 136 07/08/2015 0929   NA 135 07/28/2014 1121   K 4.1 07/08/2015 0929   K 4.3 07/28/2014 1121   CL 106 07/08/2015 0929   CL 101 07/28/2014 1121   CO2 26 07/08/2015 0929   CO2 25 07/28/2014 1121   BUN 12 07/08/2015 0929   BUN 19 07/28/2014 1121   CREATININE 0.82 07/08/2015 0929   CREATININE 1.36* 07/28/2014 1121      Component Value Date/Time   CALCIUM 9.0 07/08/2015 0929   CALCIUM 9.1 07/28/2014 1121   ALKPHOS 75 07/08/2015 0929   ALKPHOS 108 07/23/2014 1042  AST 21 07/08/2015 0929   AST 24 07/23/2014 1042   ALT 15* 07/08/2015 0929   ALT 24 07/23/2014 1042   BILITOT 0.7 07/08/2015 0929   BILITOT 0.5 07/23/2014 1042         ASSESSMENT & PLAN:   # SQUAMOUS Cell LUNG CA- STAGE IV- [ tumor nodule in the contralateral lung/ recurrence]; PET scan February 2017 shows-  Consolidative changes in the right upper lung; and also shows left upper lobe uptake/right lower lobe uptake- concerning for recurrence/ Progression [high risk to biopsy]. Travis Maddox on every Tecentriq.3 W; s/p post cycle #1. Tolerated treatment fairly well- except for mild diarrhea.   # Proceed with cycle #2 today. CBC unremarkable except for mildly lower hemoglobin of 11. CMP within normal limits. Baseline TSH normal. Plan is to do 3 cycles of treatment and then re-scan;   #  diarrhea grade 1. Continue Imodium if/as needed    #  Schizophrenia- stable.  #  Follow-up in 3 weeks with labs and/ infusion.      [name], MD 07/08/2015 10:08 AM

## 2015-07-29 ENCOUNTER — Inpatient Hospital Stay: Payer: Medicare Other | Attending: Internal Medicine | Admitting: Internal Medicine

## 2015-07-29 ENCOUNTER — Inpatient Hospital Stay: Payer: Medicare Other

## 2015-07-29 ENCOUNTER — Other Ambulatory Visit: Payer: Self-pay | Admitting: *Deleted

## 2015-07-29 VITALS — BP 132/89 | HR 73 | Temp 97.2°F | Resp 18 | Wt 173.5 lb

## 2015-07-29 DIAGNOSIS — I10 Essential (primary) hypertension: Secondary | ICD-10-CM | POA: Diagnosis not present

## 2015-07-29 DIAGNOSIS — E785 Hyperlipidemia, unspecified: Secondary | ICD-10-CM

## 2015-07-29 DIAGNOSIS — C3411 Malignant neoplasm of upper lobe, right bronchus or lung: Secondary | ICD-10-CM | POA: Insufficient documentation

## 2015-07-29 DIAGNOSIS — R197 Diarrhea, unspecified: Secondary | ICD-10-CM | POA: Diagnosis not present

## 2015-07-29 DIAGNOSIS — F209 Schizophrenia, unspecified: Secondary | ICD-10-CM | POA: Diagnosis not present

## 2015-07-29 DIAGNOSIS — R112 Nausea with vomiting, unspecified: Secondary | ICD-10-CM | POA: Diagnosis not present

## 2015-07-29 DIAGNOSIS — E039 Hypothyroidism, unspecified: Secondary | ICD-10-CM

## 2015-07-29 DIAGNOSIS — Z87891 Personal history of nicotine dependence: Secondary | ICD-10-CM | POA: Diagnosis not present

## 2015-07-29 DIAGNOSIS — Z79899 Other long term (current) drug therapy: Secondary | ICD-10-CM | POA: Insufficient documentation

## 2015-07-29 DIAGNOSIS — C3491 Malignant neoplasm of unspecified part of right bronchus or lung: Secondary | ICD-10-CM

## 2015-07-29 DIAGNOSIS — Z5112 Encounter for antineoplastic immunotherapy: Secondary | ICD-10-CM | POA: Diagnosis not present

## 2015-07-29 DIAGNOSIS — Z86010 Personal history of colonic polyps: Secondary | ICD-10-CM | POA: Diagnosis not present

## 2015-07-29 DIAGNOSIS — Z85118 Personal history of other malignant neoplasm of bronchus and lung: Secondary | ICD-10-CM | POA: Diagnosis not present

## 2015-07-29 LAB — COMPREHENSIVE METABOLIC PANEL
ALK PHOS: 73 U/L (ref 38–126)
ALT: 14 U/L — AB (ref 17–63)
AST: 24 U/L (ref 15–41)
Albumin: 4.4 g/dL (ref 3.5–5.0)
Anion gap: 9 (ref 5–15)
BILIRUBIN TOTAL: 1.1 mg/dL (ref 0.3–1.2)
BUN: 22 mg/dL — AB (ref 6–20)
CO2: 24 mmol/L (ref 22–32)
CREATININE: 1.09 mg/dL (ref 0.61–1.24)
Calcium: 9.1 mg/dL (ref 8.9–10.3)
Chloride: 101 mmol/L (ref 101–111)
GFR calc Af Amer: >60 mL/min (ref >60)
GLUCOSE: 96 mg/dL (ref 65–99)
Potassium: 3.6 mmol/L (ref 3.5–5.1)
Sodium: 134 mmol/L — ABNORMAL LOW (ref 135–145)
TOTAL PROTEIN: 7.2 g/dL (ref 6.5–8.1)

## 2015-07-29 LAB — CBC WITH DIFFERENTIAL/PLATELET
Basophils Absolute: 0 10*3/uL (ref 0–0.1)
Basophils Relative: 0 %
Eosinophils Absolute: 0 10*3/uL (ref 0–0.7)
Eosinophils Relative: 0 %
HCT: 34.9 % — ABNORMAL LOW (ref 40.0–52.0)
Hemoglobin: 12.2 g/dL — ABNORMAL LOW (ref 13.0–18.0)
Lymphocytes Relative: 20 %
Lymphs Abs: 1.2 10*3/uL (ref 1.0–3.6)
MCH: 30.4 pg (ref 26.0–34.0)
MCHC: 34.9 g/dL (ref 32.0–36.0)
MCV: 87.1 fL (ref 80.0–100.0)
Monocytes Absolute: 0.6 10*3/uL (ref 0.2–1.0)
Monocytes Relative: 10 %
Neutro Abs: 4.3 10*3/uL (ref 1.4–6.5)
Neutrophils Relative %: 70 %
Platelets: 252 10*3/uL (ref 150–440)
RBC: 4.01 MIL/uL — ABNORMAL LOW (ref 4.40–5.90)
RDW: 13.6 % (ref 11.5–14.5)
WBC: 6.1 10*3/uL (ref 3.8–10.6)

## 2015-07-29 MED ORDER — SODIUM CHLORIDE 0.9% FLUSH
10.0000 mL | Freq: Once | INTRAVENOUS | Status: AC
  Administered 2015-07-29: 10 mL via INTRAVENOUS
  Filled 2015-07-29: qty 10

## 2015-07-29 MED ORDER — PROCHLORPERAZINE MALEATE 10 MG PO TABS: 10.0000 mg | ORAL_TABLET | Freq: Four times a day (QID) | ORAL | Status: DC | PRN

## 2015-07-29 MED ORDER — SODIUM CHLORIDE 0.9 % IV SOLN
1200.0000 mg | Freq: Once | INTRAVENOUS | Status: AC
  Administered 2015-07-29: 1200 mg via INTRAVENOUS
  Filled 2015-07-29: qty 20

## 2015-07-29 MED ORDER — SODIUM CHLORIDE 0.9 % IV SOLN
Freq: Once | INTRAVENOUS | Status: AC
  Administered 2015-07-29: 10:00:00 via INTRAVENOUS
  Filled 2015-07-29: qty 1000

## 2015-07-29 MED ORDER — HEPARIN SOD (PORK) LOCK FLUSH 100 UNIT/ML IV SOLN
500.0000 [IU] | Freq: Once | INTRAVENOUS | Status: AC
  Administered 2015-07-29: 500 [IU] via INTRAVENOUS
  Filled 2015-07-29: qty 5

## 2015-07-29 MED ORDER — SODIUM CHLORIDE 0.9 % IV SOLN
Freq: Once | INTRAVENOUS | Status: AC | PRN
  Administered 2015-07-29: 11:00:00 via INTRAVENOUS
  Filled 2015-07-29 (×2): qty 4

## 2015-07-29 MED ORDER — LORAZEPAM 2 MG/ML IJ SOLN: 1.0000 mg | Freq: Once | INTRAMUSCULAR | Status: DC | PRN

## 2015-07-29 NOTE — Progress Notes (Signed)
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 Stage IV Left lung nodule- not Bx)   # FEB 2017-STAGE IV [contralateral lung nodules] PET Recurrence/Progression [high risk for [facility] 2017- START Tecentriq q 3W  # Schizophrenia/ group home resident  INTERVAL HISTORY: patient is a poor historian given schizophrenia/accompanied by caregiver.  A pleasant 60 year old male patient with above history of schizophrenia who is a group home resident- is accompanied by his care giver;  currently on immunotherapy for recurrent lung cancer is here for follow-up/ to proceed with cycle #3.  As per the caregiver patient had mild nausea vomiting for the last 2-3 days. However he has not asked for any medications. Mild diarrhea 1-2 loose stools no Imodium.  No skin rash.  As per the caregiver- overall patient is feeling okay no chest pain or shortness of breath or hemoptysis. He has lost about 4 pounds since last visit. Patient denies any chest pain or cough or hemoptysis.  No headaches vision changes.  REVIEW OF SYSTEMS: difficult to assess given his poor history giving skills.  PAST MEDICAL HISTORY :  Past Medical History  Diagnosis Date  . Schizophrenia ([place])   . Diabetes mellitus without complication ([place])   . Hypertension   . Hyperlipemia   . Chronic fatigue   . 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 Hypothyroidism   . Chronic fatigue   . Colon polyps     PAST SURGICAL HISTORY :   Past Surgical History  Procedure Laterality Date  . Colonoscopy      history of colon polyps  . Peripheral vascular catheterization N/A 08/21/2014    Procedure: Glori Luis Cath Insertion;  Surgeon: Algernon Huxley, MD;  Location: [place] [facility];  Service: Cardiovascular;  Laterality: N/A;  . Endobronchial  ultrasound  07/30/2014    right hilum FNA/EBUS    FAMILY HISTORY :  No family history on file.  SOCIAL HISTORY:   Social History  Substance Use Topics  . Smoking status: Former Smoker -- 0.25 packs/day for 42 years    Types: Cigarettes  . Smokeless tobacco: Never Used  . Alcohol Use: No    ALLERGIES:  has No Known Allergies.  MEDICATIONS:  Current Outpatient Prescriptions  Medication Sig Dispense Refill  . atorvastatin (LIPITOR) 10 MG tablet Take 10 mg by mouth every morning.     . calcium carbonate (TUMS - DOSED IN MG ELEMENTAL CALCIUM) 500 MG chewable tablet Chew 1 tablet by mouth 2 (two) times daily.    . ferrous sulfate 325 (65 FE) MG tablet Take 325 mg by mouth daily with breakfast.    . levothyroxine (SYNTHROID, LEVOTHROID) 88 MCG tablet Take 88 mcg by mouth every morning.     . nystatin (MYCOSTATIN) 100000 UNIT/ML suspension Take 5 ml up to 4 times a day as needed for discomfort in throat. ( swish and swallow). 480 mL 1  . OLANZapine (ZYPREXA) 20 MG tablet Take 30 mg by mouth at bedtime.     . prochlorperazine (COMPAZINE) 10 MG tablet Take 10 mg by mouth every 6 (six) hours as needed for nausea or vomiting.    . propranolol (INDERAL) 10 MG tablet Take 10 mg by mouth 3 (three) times daily.    . Vitamin D, Ergocalciferol, (DRISDOL) 50000 UNITS CAPS capsule  Take 50,000 Units by mouth every 7 (seven) days. On Thursday     No current facility-administered medications for this visit.    PHYSICAL EXAMINATION: ECOG PERFORMANCE STATUS: 0 - Asymptomatic  BP 132/89 mmHg  Pulse 73  Temp(Src) 97.2 F (36.2 C)  Resp 18  Wt 173 lb 8 oz (78.7 kg)  Filed Weights   07/29/15 0918  Weight: 173 lb 8 oz (78.7 kg)    GENERAL: Well-nourished well-developed; Alert, no distress and comfortable.  Accompanied by caregiver. EYES: no pallor or icterus OROPHARYNX: no thrush or ulceration; dentures. NECK: supple, no masses felt LYMPH:  no palpable lymphadenopathy in the cervical, axillary  or inguinal regions LUNGS: decreased air entry bilaterally  No wheeze or crackles HEART/CVS: regular rate & rhythm and no murmurs; No lower extremity edema ABDOMEN:abdomen soft, non-tender and normal bowel sounds Musculoskeletal:no cyanosis of digits and no clubbing  PSYCH: alert & oriented x 3 ; slow speech; flat affect. NEURO: no focal motor/sensory deficits SKIN:  no rashes or significant lesions  LABORATORY DATA:  I have reviewed the data as listed    Component Value Date/Time   NA 134* 07/29/2015 0857   NA 135 07/28/2014 1121   K 3.6 07/29/2015 0857   K 4.3 07/28/2014 1121   CL 101 07/29/2015 0857   CL 101 07/28/2014 1121   CO2 24 07/29/2015 0857   CO2 25 07/28/2014 1121   GLUCOSE 96 07/29/2015 0857   GLUCOSE 135* 07/28/2014 1121   BUN 22* 07/29/2015 0857   BUN 19 07/28/2014 1121   CREATININE 1.09 07/29/2015 0857   CREATININE 1.36* 07/28/2014 1121   CALCIUM 9.1 07/29/2015 0857   CALCIUM 9.1 07/28/2014 1121   PROT 7.2 07/29/2015 0857   PROT 7.6 07/23/2014 1042   ALBUMIN 4.4 07/29/2015 0857   ALBUMIN 4.2 07/23/2014 1042   AST 24 07/29/2015 0857   AST 24 07/23/2014 1042   ALT 14* 07/29/2015 0857   ALT 24 07/23/2014 1042   ALKPHOS 73 07/29/2015 0857   ALKPHOS 108 07/23/2014 1042   BILITOT 1.1 07/29/2015 0857   BILITOT 0.5 07/23/2014 1042   GFRNONAA >60 07/29/2015 0857   GFRNONAA 57* 07/28/2014 1121   GFRAA >60 07/29/2015 0857   GFRAA >60 07/28/2014 1121    No results found for: SPEP, UPEP  Lab Results  Component Value Date   WBC 6.1 07/29/2015   NEUTROABS 4.3 07/29/2015   HGB 12.2* 07/29/2015   HCT 34.9* 07/29/2015   MCV 87.1 07/29/2015   PLT 252 07/29/2015      Chemistry      Component Value Date/Time   NA 134* 07/29/2015 0857   NA 135 07/28/2014 1121   K 3.6 07/29/2015 0857   K 4.3 07/28/2014 1121   CL 101 07/29/2015 0857   CL 101 07/28/2014 1121   CO2 24 07/29/2015 0857   CO2 25 07/28/2014 1121   BUN 22* 07/29/2015 0857   BUN 19 07/28/2014  1121   CREATININE 1.09 07/29/2015 0857   CREATININE 1.36* 07/28/2014 1121      Component Value Date/Time   CALCIUM 9.1 07/29/2015 0857   CALCIUM 9.1 07/28/2014 1121   ALKPHOS 73 07/29/2015 0857   ALKPHOS 108 07/23/2014 1042   AST 24 07/29/2015 0857   AST 24 07/23/2014 1042   ALT 14* 07/29/2015 0857   ALT 24 07/23/2014 1042   BILITOT 1.1 07/29/2015 0857   BILITOT 0.5 07/23/2014 1042         ASSESSMENT & PLAN:   #  SQUAMOUS Cell LUNG CA- STAGE IV- [ tumor nodule in the contralateral lung/ recurrence]; PET scan February 2017 shows-  Consolidative changes in the right upper lung; and also shows left upper lobe uptake/right lower lobe uptake- concerning for recurrence/ Progression [high risk to biopsy]. Patient on every Tecentriq.3 W; s/p post cycle #2. Tolerated treatment fairly well- except for mild diarrhea.   # Proceed with cycle #3 today. CBC unremarkable except for mildly lower hemoglobin of 12. CMP within normal limits. Baseline TSH normal. plan CT scan after this cycle.   # Nausea vomiting grade 1- question viral infection less likely from immunotherapy. Currently resolved.  #  diarrhea grade 1. Continue Imodium if/as needed    #  Schizophrenia- stable.  #  Follow-up in 3 weeks with labs and/ infusion. Patient will have CT scan in approximately 2 weeks. The above plan of care was discussed with the patient's caregiver in detail.     [name], MD 07/29/2015 9:45 AM

## 2015-07-29 NOTE — Progress Notes (Signed)
Patient has had n/v for past 2 days.  Caregiver states he does not ask for his Compazine.  Instructed patient to ask for nausea medication when he first feels nauseated.  States he is having some diarrhea.

## 2015-07-29 NOTE — Progress Notes (Signed)
Pt requested RF on compazine. RX sent to [facility]. Per v/o Dr. Rogue Bussing

## 2015-07-29 NOTE — Progress Notes (Signed)
Clarification of treatment plan orders. Spoke with MD, Dr. Rogue Bussing, via telephone. Per MD, Dr. Rogue Bussing, order: Do not release or give the Zofran and Ativan orders from the treatment plan.

## 2015-07-29 NOTE — Progress Notes (Signed)
Pt states he is feeling nauseated since sitting in chair. PRN orders for nausea released

## 2015-08-11 ENCOUNTER — Ambulatory Visit
Admission: RE | Admit: 2015-08-11 | Discharge: 2015-08-11 | Disposition: A | Discharge status: Home / Self Care | Payer: Medicare Other | Source: Ambulatory Visit | Attending: Internal Medicine | Admitting: Internal Medicine

## 2015-08-11 DIAGNOSIS — I2510 Atherosclerotic heart disease of native coronary artery without angina pectoris: Secondary | ICD-10-CM | POA: Diagnosis not present

## 2015-08-11 DIAGNOSIS — J90 Pleural effusion, not elsewhere classified: Secondary | ICD-10-CM | POA: Insufficient documentation

## 2015-08-11 DIAGNOSIS — C3491 Malignant neoplasm of unspecified part of right bronchus or lung: Secondary | ICD-10-CM | POA: Diagnosis not present

## 2015-08-11 DIAGNOSIS — R911 Solitary pulmonary nodule: Secondary | ICD-10-CM | POA: Insufficient documentation

## 2015-08-11 MED ORDER — IOPAMIDOL (ISOVUE-300) INJECTION 61%
75.0000 mL | Freq: Once | INTRAVENOUS | Status: AC | PRN
  Administered 2015-08-11: 75 mL via INTRAVENOUS

## 2015-08-19 ENCOUNTER — Inpatient Hospital Stay: Payer: Medicare Other

## 2015-08-19 ENCOUNTER — Telehealth: Payer: Self-pay | Admitting: Pharmacist

## 2015-08-19 ENCOUNTER — Inpatient Hospital Stay (HOSPITAL_BASED_OUTPATIENT_CLINIC_OR_DEPARTMENT_OTHER): Payer: Medicare Other | Admitting: Internal Medicine

## 2015-08-19 ENCOUNTER — Inpatient Hospital Stay: Payer: Medicare Other | Attending: Internal Medicine

## 2015-08-19 VITALS — BP 130/87 | HR 55 | Temp 97.2°F | Wt 182.8 lb

## 2015-08-19 DIAGNOSIS — E785 Hyperlipidemia, unspecified: Secondary | ICD-10-CM | POA: Diagnosis not present

## 2015-08-19 DIAGNOSIS — E039 Hypothyroidism, unspecified: Secondary | ICD-10-CM | POA: Diagnosis not present

## 2015-08-19 DIAGNOSIS — R918 Other nonspecific abnormal finding of lung field: Secondary | ICD-10-CM | POA: Diagnosis not present

## 2015-08-19 DIAGNOSIS — R197 Diarrhea, unspecified: Secondary | ICD-10-CM

## 2015-08-19 DIAGNOSIS — Z923 Personal history of irradiation: Secondary | ICD-10-CM

## 2015-08-19 DIAGNOSIS — Z79899 Other long term (current) drug therapy: Secondary | ICD-10-CM | POA: Diagnosis not present

## 2015-08-19 DIAGNOSIS — Z86010 Personal history of colonic polyps: Secondary | ICD-10-CM | POA: Insufficient documentation

## 2015-08-19 DIAGNOSIS — Z85118 Personal history of other malignant neoplasm of bronchus and lung: Secondary | ICD-10-CM | POA: Diagnosis not present

## 2015-08-19 DIAGNOSIS — Z9221 Personal history of antineoplastic chemotherapy: Secondary | ICD-10-CM

## 2015-08-19 DIAGNOSIS — C3491 Malignant neoplasm of unspecified part of right bronchus or lung: Secondary | ICD-10-CM

## 2015-08-19 DIAGNOSIS — Z87891 Personal history of nicotine dependence: Secondary | ICD-10-CM | POA: Diagnosis not present

## 2015-08-19 DIAGNOSIS — C3411 Malignant neoplasm of upper lobe, right bronchus or lung: Secondary | ICD-10-CM | POA: Insufficient documentation

## 2015-08-19 DIAGNOSIS — L259 Unspecified contact dermatitis, unspecified cause: Secondary | ICD-10-CM | POA: Diagnosis not present

## 2015-08-19 DIAGNOSIS — R112 Nausea with vomiting, unspecified: Secondary | ICD-10-CM

## 2015-08-19 DIAGNOSIS — E119 Type 2 diabetes mellitus without complications: Secondary | ICD-10-CM | POA: Insufficient documentation

## 2015-08-19 DIAGNOSIS — F209 Schizophrenia, unspecified: Secondary | ICD-10-CM

## 2015-08-19 DIAGNOSIS — I10 Essential (primary) hypertension: Secondary | ICD-10-CM

## 2015-08-19 LAB — CBC WITH DIFFERENTIAL/PLATELET
Basophils Absolute: 0 10*3/uL (ref 0–0.1)
Basophils Relative: 0 %
Eosinophils Absolute: 0 10*3/uL (ref 0–0.7)
Eosinophils Relative: 0 %
HCT: 35.6 % — ABNORMAL LOW (ref 40.0–52.0)
HEMOGLOBIN: 12.2 g/dL — AB (ref 13.0–18.0)
LYMPHS ABS: 1.1 10*3/uL (ref 1.0–3.6)
LYMPHS PCT: 23 %
MCH: 30.3 pg (ref 26.0–34.0)
MCHC: 34.2 g/dL (ref 32.0–36.0)
MCV: 88.6 fL (ref 80.0–100.0)
Monocytes Absolute: 0.5 10*3/uL (ref 0.2–1.0)
Monocytes Relative: 11 %
NEUTROS ABS: 3.2 10*3/uL (ref 1.4–6.5)
NEUTROS PCT: 66 %
Platelets: 218 10*3/uL (ref 150–440)
RBC: 4.01 MIL/uL — AB (ref 4.40–5.90)
RDW: 14.1 % (ref 11.5–14.5)
WBC: 4.9 10*3/uL (ref 3.8–10.6)

## 2015-08-19 LAB — COMPREHENSIVE METABOLIC PANEL
ALK PHOS: 69 U/L (ref 38–126)
ALT: 13 U/L — AB (ref 17–63)
AST: 21 U/L (ref 15–41)
Albumin: 3.9 g/dL (ref 3.5–5.0)
Anion gap: 6 (ref 5–15)
BUN: 11 mg/dL (ref 6–20)
CALCIUM: 9.1 mg/dL (ref 8.9–10.3)
CO2: 26 mmol/L (ref 22–32)
CREATININE: 0.81 mg/dL (ref 0.61–1.24)
Chloride: 105 mmol/L (ref 101–111)
Glucose, Bld: 118 mg/dL — ABNORMAL HIGH (ref 65–99)
Potassium: 4.1 mmol/L (ref 3.5–5.1)
Sodium: 137 mmol/L (ref 135–145)
TOTAL PROTEIN: 6.5 g/dL (ref 6.5–8.1)
Total Bilirubin: 0.3 mg/dL (ref 0.3–1.2)

## 2015-08-19 MED ORDER — HEPARIN SOD (PORK) LOCK FLUSH 100 UNIT/ML IV SOLN
500.0000 [IU] | Freq: Once | INTRAVENOUS | Status: AC
  Administered 2015-08-19: 500 [IU] via INTRAVENOUS
  Filled 2015-08-19: qty 5

## 2015-08-19 MED ORDER — LEVOFLOXACIN 500 MG PO TABS: 500.0000 mg | ORAL_TABLET | Freq: Every day | ORAL | Status: DC

## 2015-08-19 MED ORDER — SODIUM CHLORIDE 0.9% FLUSH
10.0000 mL | INTRAVENOUS | Status: DC | PRN
  Administered 2015-08-19: 10 mL via INTRAVENOUS
  Filled 2015-08-19: qty 10

## 2015-08-19 NOTE — Telephone Encounter (Signed)
Holding tecentriq. Ct scan concerning for lung infection. Please d/c port- Per staff message today.

## 2015-08-19 NOTE — Progress Notes (Signed)
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 Stage IV Left lung nodule- not Bx)   # FEB 2017-STAGE IV [contralateral lung nodules] PET Recurrence/Progression [high risk for [facility] 2017- START Tecentriq q 3W x3; April 2017 CT- LUL Stable/slight increase in size [~14m]; new lingula ~26m improved RUL radiation changes; Improved basilar nodules ? Atypical or infectious etilogy  # Schizophrenia/ group home resident  INTERVAL HISTORY: patient is a poor historian given schizophrenia/accompanied by caregiver.  A pleasant [age]ear old male patient with above history of schizophrenia who is a group home resident- is accompanied by his care giver;  currently on immunotherapy for recurrent lung cancer is here for follow-up/ to proceed with cycle #4/ and review the results of the re-staging scan.  As per the caregiver patient had mild nausea vomiting- 1 or 2 episodes. However he has not asked for any medications. No skin rash.  As per the caregiver- overall patient is feeling okay;  no chest pain or shortness of breath or hemoptysis. Patient denies any chest pain or cough or hemoptysis.  No headaches vision changes. Patient has gained about 9 pounds since the last visit.  REVIEW OF SYSTEMS: difficult to assess given his poor history giving skills.  PAST MEDICAL HISTORY :  Past Medical History  Diagnosis Date  . Sch[place]ia ([place]  . Diabetes mellitus without complication ([place]  . Hypertension   . Hyperlipemia   . Chronic fatigue   . Hypothyroidism   . Chronic fatigue   . Colon polyps   . [place]    PAST SURGICAL HISTORY :   Past Surgical History  Procedure Laterality Date  . Colonoscopy      history of colon polyps  . Peripheral vascular  catheterization N/A 08/21/2014    Procedure: PoGlori Luisath Insertion;  Surgeon: JaAlgernon HuxleyMD;  [place];  Service: Cardiovascular;  Laterality: N/A;  . Endobronchial ultrasound  07/30/2014    right hilum FNA/EBUS    FAMILY HISTORY :  No family history on file.  SOCIAL HISTORY:   Social History  Substance Use Topics  . Smoking status: Former Smoker -- 0.25 packs/day for 42 years    Types: Cigarettes  . Smokeless tobacco: Never Used  . Alcohol Use: No    ALLERGIES:  has No Known Allergies.  MEDICATIONS:  Current Outpatient Prescriptions  Medication Sig Dispense Refill  . atorvastatin (LIPITOR) 10 MG tablet Take 10 mg by mouth every morning.     . calcium carbonate (TUMS - DOSED IN MG ELEMENTAL CALCIUM) 500 MG chewable tablet Chew 1 tablet by mouth 2 (two) times daily.    . ferrous sulfate 325 (65 FE) MG tablet Take 325 mg by mouth daily with breakfast.    . levothyroxine (SYNTHROID, LEVOTHROID) 88 MCG tablet Take 88 mcg by mouth every morning.     . nystatin (MYCOSTATIN) 100000 UNIT/ML suspension Take 5 ml up to 4 times a day as needed for discomfort in throat. ( swish and swallow). 480 mL 1  . OLANZapine (ZYPREXA) 20 MG tablet Take 30 mg by mouth at bedtime.     . prochlorperazine (COMPAZINE) 10 MG tablet Take 1 tablet (10 mg total) by mouth every 6 (six) hours as needed for nausea or vomiting. [place]  tablet 6  . propranolol (INDERAL) 10 MG tablet Take 10 mg by mouth 3 (three) times daily.    . Vitamin D, Ergocalciferol, (DRISDOL) 50000 UNITS CAPS capsule Take 50,000 Units by mouth every 7 (seven) days. On Thursday     No current facility-administered medications for this visit.    PHYSICAL EXAMINATION: ECOG PERFORMANCE STATUS: 0 - Asymptomatic  There were no vitals taken for this visit.  There were no vitals filed for this visit.  GENERAL: Well-nourished well-developed; Alert, no distress and comfortable.  Accompanied by caregiver. EYES: no pallor or  icterus OROPHARYNX: no thrush or ulceration; dentures. NECK: supple, no masses felt LYMPH:  no palpable lymphadenopathy in the cervical, axillary or inguinal regions LUNGS: decreased air entry bilaterally  No wheeze or crackles HEART/CVS: regular rate & rhythm and no murmurs; No lower extremity edema ABDOMEN:abdomen soft, non-tender and normal bowel sounds Musculoskeletal:no cyanosis of digits and no clubbing  PSYCH: alert & oriented x 3 ; slow speech; flat affect. NEURO: no focal motor/sensory deficits SKIN:  no rashes or significant lesions  LABORATORY DATA:  I have reviewed the data as listed    Component Value Date/Time   NA 134* 07/29/2015 0857   NA 135 07/28/2014 1121   K 3.6 07/29/2015 0857   K 4.3 07/28/2014 1121   CL 101 07/29/2015 0857   CL 101 07/28/2014 1121   CO2 24 07/29/2015 0857   CO2 25 07/28/2014 1121   GLUCOSE 96 07/29/2015 0857   GLUCOSE 135* 07/28/2014 1121   BUN 22* 07/29/2015 0857   BUN 19 07/28/2014 1121   CREATININE 1.09 07/29/2015 0857   CREATININE 1.36* 07/28/2014 1121   CALCIUM 9.1 07/29/2015 0857   CALCIUM 9.1 07/28/2014 1121   PROT 7.2 07/29/2015 0857   PROT 7.6 07/23/2014 1042   ALBUMIN 4.4 07/29/2015 0857   ALBUMIN 4.2 07/23/2014 1042   AST 24 07/29/2015 0857   AST 24 07/23/2014 1042   ALT 14* 07/29/2015 0857   ALT 24 07/23/2014 1042   ALKPHOS 73 07/29/2015 0857   ALKPHOS 108 07/23/2014 1042   BILITOT 1.1 07/29/2015 0857   BILITOT 0.5 07/23/2014 1042   GFRNONAA >60 07/29/2015 0857   GFRNONAA 57* 07/28/2014 1121   GFRAA >60 07/29/2015 0857   GFRAA >60 07/28/2014 1121    No results found for: SPEP, UPEP  Lab Results  Component Value Date   WBC 6.1 07/29/2015   NEUTROABS 4.3 07/29/2015   HGB 12.2* 07/29/2015   HCT 34.9* 07/29/2015   MCV 87.1 07/29/2015   PLT 252 07/29/2015      Chemistry      Component Value Date/Time   NA 134* 07/29/2015 0857   NA 135 07/28/2014 1121   K 3.6 07/29/2015 0857   K 4.3 07/28/2014 1121   CL  101 07/29/2015 0857   CL 101 07/28/2014 1121   CO2 24 07/29/2015 0857   CO2 25 07/28/2014 1121   BUN 22* 07/29/2015 0857   BUN 19 07/28/2014 1121   CREATININE 1.09 07/29/2015 0857   CREATININE 1.36* 07/28/2014 1121      Component Value Date/Time   CALCIUM 9.1 07/29/2015 0857   CALCIUM 9.1 07/28/2014 1121   ALKPHOS 73 07/29/2015 0857   ALKPHOS 108 07/23/2014 1042   AST 24 07/29/2015 0857   AST 24 07/23/2014 1042   ALT 14* 07/29/2015 0857   ALT 24 07/23/2014 1042   BILITOT 1.1 07/29/2015 0857   BILITOT 0.5 07/23/2014 1042  ASSESSMENT & PLAN:   # SQUAMOUS Cell LUNG CA- STAGE IV- [ tumor nodule in the contralateral lung/ recurrence]; s/p Tecentriq x3 cycles; Re-staging April 2017 CT- LUL Stable/slight increase in size [~86m]; new lingula ~275m improved RUL radiation changes; Improved basilar nodules ? Atypical or infectious etiology.   # HOLD Tecentriq Today; and again in 3 weeks given possibility of infectious etiology/inflammation. See discussion below.   # Basilar nodules- Improved/some new- ? Etiology- do not appear to be inflammatory/pneumonitis from Immunotherapy. Hold of steroids- as patient does not seem to be very symptomatic. Recommend Levaquin for 10 days. Repeat CAT scan in approximately 5 weeks. This will be ordered at next visit.  # Nausea vomiting grade 1- question viral infection less likely from immunotherapy. Currently resolved.  #  diarrhea grade 1. Continue Imodium if/as needed    #  Schizophrenia- stable.  #  Follow-up in 3 weeks with labs and/no infusion. Patient will have CT scan in approximately 2 weeks. The above plan of care was discussed with the patient's caregiver in detail. I reviewed the images myself and the caregiver in detail.     GoCammie SickleMD 08/19/2015 8:13 AM

## 2015-09-09 ENCOUNTER — Inpatient Hospital Stay (HOSPITAL_BASED_OUTPATIENT_CLINIC_OR_DEPARTMENT_OTHER): Payer: Medicare Other | Admitting: Internal Medicine

## 2015-09-09 ENCOUNTER — Inpatient Hospital Stay: Payer: Medicare Other

## 2015-09-09 VITALS — BP 144/94 | HR 57 | Temp 97.5°F | Resp 20 | Ht 72.0 in | Wt 184.1 lb

## 2015-09-09 DIAGNOSIS — F209 Schizophrenia, unspecified: Secondary | ICD-10-CM

## 2015-09-09 DIAGNOSIS — Z9221 Personal history of antineoplastic chemotherapy: Secondary | ICD-10-CM | POA: Diagnosis not present

## 2015-09-09 DIAGNOSIS — E039 Hypothyroidism, unspecified: Secondary | ICD-10-CM

## 2015-09-09 DIAGNOSIS — R197 Diarrhea, unspecified: Secondary | ICD-10-CM

## 2015-09-09 DIAGNOSIS — Z923 Personal history of irradiation: Secondary | ICD-10-CM

## 2015-09-09 DIAGNOSIS — Z85118 Personal history of other malignant neoplasm of bronchus and lung: Secondary | ICD-10-CM | POA: Diagnosis not present

## 2015-09-09 DIAGNOSIS — E785 Hyperlipidemia, unspecified: Secondary | ICD-10-CM

## 2015-09-09 DIAGNOSIS — Z79899 Other long term (current) drug therapy: Secondary | ICD-10-CM

## 2015-09-09 DIAGNOSIS — C3411 Malignant neoplasm of upper lobe, right bronchus or lung: Secondary | ICD-10-CM

## 2015-09-09 DIAGNOSIS — J189 Pneumonia, unspecified organism: Secondary | ICD-10-CM

## 2015-09-09 DIAGNOSIS — I10 Essential (primary) hypertension: Secondary | ICD-10-CM

## 2015-09-09 DIAGNOSIS — Z87891 Personal history of nicotine dependence: Secondary | ICD-10-CM

## 2015-09-09 DIAGNOSIS — C3491 Malignant neoplasm of unspecified part of right bronchus or lung: Secondary | ICD-10-CM

## 2015-09-09 DIAGNOSIS — E119 Type 2 diabetes mellitus without complications: Secondary | ICD-10-CM

## 2015-09-09 DIAGNOSIS — R918 Other nonspecific abnormal finding of lung field: Secondary | ICD-10-CM

## 2015-09-09 DIAGNOSIS — L259 Unspecified contact dermatitis, unspecified cause: Secondary | ICD-10-CM

## 2015-09-09 LAB — COMPREHENSIVE METABOLIC PANEL
ALBUMIN: 4.3 g/dL (ref 3.5–5.0)
ALK PHOS: 73 U/L (ref 38–126)
ALT: 12 U/L — ABNORMAL LOW (ref 17–63)
ANION GAP: 4 — AB (ref 5–15)
AST: 21 U/L (ref 15–41)
BILIRUBIN TOTAL: 0.5 mg/dL (ref 0.3–1.2)
BUN: 10 mg/dL (ref 6–20)
CALCIUM: 9.2 mg/dL (ref 8.9–10.3)
CO2: 28 mmol/L (ref 22–32)
Chloride: 103 mmol/L (ref 101–111)
Creatinine, Ser: 0.92 mg/dL (ref 0.61–1.24)
GFR calc Af Amer: >60 mL/min (ref >60)
GFR calc non Af Amer: >60 mL/min (ref >60)
GLUCOSE: 102 mg/dL — AB (ref 65–99)
POTASSIUM: 4.3 mmol/L (ref 3.5–5.1)
SODIUM: 135 mmol/L (ref 135–145)
TOTAL PROTEIN: 6.8 g/dL (ref 6.5–8.1)

## 2015-09-09 LAB — CBC WITH DIFFERENTIAL/PLATELET
BASOS ABS: 0 10*3/uL (ref 0–0.1)
BASOS PCT: 0 %
EOS ABS: 0 10*3/uL (ref 0–0.7)
Eosinophils Relative: 0 %
HEMATOCRIT: 37.6 % — AB (ref 40.0–52.0)
HEMOGLOBIN: 13 g/dL (ref 13.0–18.0)
Lymphocytes Relative: 17 %
Lymphs Abs: 1.2 10*3/uL (ref 1.0–3.6)
MCH: 30.6 pg (ref 26.0–34.0)
MCHC: 34.5 g/dL (ref 32.0–36.0)
MCV: 88.6 fL (ref 80.0–100.0)
Monocytes Absolute: 0.7 10*3/uL (ref 0.2–1.0)
Monocytes Relative: 10 %
NEUTROS ABS: 4.9 10*3/uL (ref 1.4–6.5)
NEUTROS PCT: 73 %
Platelets: 226 10*3/uL (ref 150–440)
RBC: 4.24 MIL/uL — ABNORMAL LOW (ref 4.40–5.90)
RDW: 13.9 % (ref 11.5–14.5)
WBC: 6.8 10*3/uL (ref 3.8–10.6)

## 2015-09-09 NOTE — Progress Notes (Signed)
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 Stage IV Left lung nodule- not Bx)   # FEB 2017-STAGE IV [contralateral lung nodules] PET Recurrence/Progression [high risk for [facility] 2017- START Tecentriq q 3W x3; April 2017 CT- LUL Stable/slight increase in size [~61m]; new lingula ~246m improved RUL radiation changes; Improved basilar nodules ? Atypical or infectious etilogy  # Schizophrenia/ group home resident  INTERVAL HISTORY: patient is a poor historian given schizophrenia/accompanied by caregiver.  A pleasant [id number] old male patient with above history of schizophrenia who is a group home resident- is accompanied by his care giver;  currently on immunotherapy for recurrent lung cancer is here for follow-up. His treatment was held 3 weeks ago because of pneumonitis/atypical pneumonia on restaging CAT scan. Patient has been treated with Levaquin for 10 days.   No new shortness of breath or chest pain. No cough. He had 1-2 loose stools few days Ago.   Patient had skin rash bilateral upper extremity is related to- new watch. This is getting better..  No headaches vision changes.  REVIEW OF SYSTEMS: difficult to assess given his poor history giving skills.  PAST MEDICAL HISTORY :  Past Medical History  Diagnosis Date  . Schizophrenia ([facility]  . Diabetes mellitus without complication ([facility]  . Hypertension   . Hyperlipemia   . Chronic fatigue   . Hypothyroidism   . Chronic fatigue   . Colon polyps   . Squamous cell carcinoma of right lung ([facility]5/09/2014    PAST SURGICAL HISTORY :   Past Surgical History  Procedure Laterality Date  . Colonoscopy      history of colon polyps  . Peripheral vascular catheterization N/A 08/21/2014    Procedure: PoGlori Luisath Insertion;  Surgeon: JaAlgernon HuxleyMD;  [facility];  Service: Cardiovascular;  Laterality: N/A;  . Endobronchial ultrasound  07/30/2014    right hilum FNA/EBUS    FAMILY HISTORY :  No family history on file.  SOCIAL HISTORY:   Social History  Substance Use Topics  . Smoking status: Former Smoker -- 0.25 packs/day for 42 years    Types: Cigarettes  . Smokeless tobacco: Never Used  . Alcohol Use: No    ALLERGIES:  has No Known Allergies.  MEDICATIONS:  Current Outpatient Prescriptions  Medication Sig Dispense Refill  . atorvastatin (LIPITOR) 10 MG tablet Take 10 mg by mouth every morning.     . calcium carbonate (TUMS - DOSED IN MG ELEMENTAL CALCIUM) 500 MG chewable tablet Chew 1 tablet by mouth 2 (two) times daily.    . ferrous sulfate 325 (65 FE) MG tablet Take 325 mg by mouth daily with breakfast.    . levofloxacin (LEVAQUIN) 500 MG tablet Take 1 tablet (500 mg total) by mouth daily. 10 tablet 0  . levothyroxine (SYNTHROID, LEVOTHROID) 88 MCG tablet Take 88 mcg by mouth every morning.     . nystatin (MYCOSTATIN) 100000 UNIT/ML suspension Take 5 ml up to 4 times a day as needed for discomfort in throat. ( swish and swallow). 480 mL 1  . OLANZapine (ZYPREXA) 20 MG tablet Take 30 mg by mouth at bedtime.     . ondansetron (ZOFRAN-ODT) 4 MG disintegrating tablet Take 4 mg by mouth every 8 (eight) hours as needed for nausea or  vomiting.    . prochlorperazine (COMPAZINE) 10 MG tablet Take 1 tablet (10 mg total) by mouth every 6 (six) hours as needed for nausea or vomiting. 30 tablet 6  . propranolol (INDERAL) 10 MG tablet Take 10 mg by mouth 3 (three) times daily.    . sucralfate (CARAFATE) 1 g tablet Take 1 g by mouth 4 (four) times daily -  with meals and at bedtime.    . Vitamin D, Ergocalciferol, (DRISDOL) 50000 UNITS CAPS capsule Take 50,000 Units by mouth every 7 (seven) days. On Thursday     No current facility-administered medications for this visit.    PHYSICAL EXAMINATION: ECOG  PERFORMANCE STATUS: 0 - Asymptomatic  BP 144/94 mmHg  Pulse 57  Temp(Src) 97.5 F (36.4 C) (Tympanic)  Resp 20  Ht 6' (1.829 m)  Wt 184 lb 1.4 oz (83.5 kg)  BMI 24.96 kg/m2  Filed Weights   09/09/15 0910  Weight: 184 lb 1.4 oz (83.5 kg)    GENERAL: Well-nourished well-developed; Alert, no distress and comfortable.  Accompanied by caregiver. EYES: no pallor or icterus OROPHARYNX: no thrush or ulceration; dentures. NECK: supple, no masses felt LYMPH:  no palpable lymphadenopathy in the cervical, axillary or inguinal regions LUNGS: decreased air entry bilaterally  No wheeze or crackles HEART/CVS: regular rate & rhythm and no murmurs; No lower extremity edema ABDOMEN:abdomen soft, non-tender and normal bowel sounds Musculoskeletal:no cyanosis of digits and no clubbing  PSYCH: alert & oriented x 3 ; slow speech; flat affect. NEURO: no focal motor/sensory deficits SKIN:  no rashes or significant lesions  LABORATORY DATA:  I have reviewed the data as listed    Component Value Date/Time   NA 135 09/09/2015 0858   NA 135 07/28/2014 1121   K 4.3 09/09/2015 0858   K 4.3 07/28/2014 1121   CL 103 09/09/2015 0858   CL 101 07/28/2014 1121   CO2 28 09/09/2015 0858   CO2 25 07/28/2014 1121   GLUCOSE 102* 09/09/2015 0858   GLUCOSE 135* 07/28/2014 1121   BUN 10 09/09/2015 0858   BUN 19 07/28/2014 1121   CREATININE 0.92 09/09/2015 0858   CREATININE 1.36* 07/28/2014 1121   CALCIUM 9.2 09/09/2015 0858   CALCIUM 9.1 07/28/2014 1121   PROT 6.8 09/09/2015 0858   PROT 7.6 07/23/2014 1042   ALBUMIN 4.3 09/09/2015 0858   ALBUMIN 4.2 07/23/2014 1042   AST 21 09/09/2015 0858   AST 24 07/23/2014 1042   ALT 12* 09/09/2015 0858   ALT 24 07/23/2014 1042   ALKPHOS 73 09/09/2015 0858   ALKPHOS 108 07/23/2014 1042   BILITOT 0.5 09/09/2015 0858   BILITOT 0.5 07/23/2014 1042   GFRNONAA >60 09/09/2015 0858   GFRNONAA 57* 07/28/2014 1121   GFRAA >60 09/09/2015 0858   GFRAA >60 07/28/2014 1121     No results found for: SPEP, UPEP  Lab Results  Component Value Date   WBC 6.8 09/09/2015   NEUTROABS 4.9 09/09/2015   HGB 13.0 09/09/2015   HCT 37.6* 09/09/2015   MCV 88.6 09/09/2015   PLT 226 09/09/2015      Chemistry      Component Value Date/Time   NA 135 09/09/2015 0858   NA 135 07/28/2014 1121   K 4.3 09/09/2015 0858   K 4.3 07/28/2014 1121   CL 103 09/09/2015 0858   CL 101 07/28/2014 1121   CO2 28 09/09/2015 0858   CO2 25 07/28/2014 1121   BUN 10 09/09/2015 0858   BUN 19 07/28/2014 1121  CREATININE 0.92 09/09/2015 0858   CREATININE 1.36* 07/28/2014 1121      Component Value Date/Time   CALCIUM 9.2 09/09/2015 0858   CALCIUM 9.1 07/28/2014 1121   ALKPHOS 73 09/09/2015 0858   ALKPHOS 108 07/23/2014 1042   AST 21 09/09/2015 0858   AST 24 07/23/2014 1042   ALT 12* 09/09/2015 0858   ALT 24 07/23/2014 1042   BILITOT 0.5 09/09/2015 0858   BILITOT 0.5 07/23/2014 1042         ASSESSMENT & PLAN:   # SQUAMOUS Cell LUNG CA- STAGE IV- [ tumor nodule in the contralateral lung/ recurrence]; s/p Tecentriq x3 cycles; Re-staging April 2017 CT- LUL Stable/slight increase in size [~56m]; new lingula ~242m improved RUL radiation changes; Improved basilar nodules ? Atypical or infectious etiology.   # We will continue to hold Tecentriq Today; and again in 3 weeks given possibility of infectious etiology/inflammation. See discussion below.   # Basilar nodules- Improved/some new- ? Etiology- do not appear to be inflammatory/pneumonitis from Immunotherapy. Status post Levaquin for 10 days. Will repeat a CT scan to be done in approximately 3 weeks /prior to next visit.   #  diarrhea grade 1. Continue Imodium if/as needed    # Bilateral upper extremity skin rash- improving secondary to wrist watch/contact dermatitis.  #  Follow-up in 3 weeks with labs and/with infusion. Patient will have CT scan in approximately 2 days. The above plan of care was discussed with the  patient's caregiver in detail. CBC CMP from today are unremarkable.     GoCammie SickleMD 09/09/2015 9:29 AM

## 2015-09-09 NOTE — Progress Notes (Signed)
Pt's caregiver reports a red rash on pt's forearms bilaterally. pt's caregiver states that rash occured when he put a new metallic watch on his left arm.  pt switched watch to other arm and the same thing happened a few days later.  Rash extends from bilaterally arms from wrist to elbow. Pt will change watches to see if this helps the contact dermatitis.

## 2015-09-28 ENCOUNTER — Ambulatory Visit
Admission: RE | Admit: 2015-09-28 | Discharge: 2015-09-28 | Disposition: A | Discharge status: Home / Self Care | Payer: Medicare Other | Source: Ambulatory Visit | Attending: Internal Medicine | Admitting: Internal Medicine

## 2015-09-28 DIAGNOSIS — I700 Atherosclerosis of aorta: Secondary | ICD-10-CM | POA: Diagnosis not present

## 2015-09-28 DIAGNOSIS — C3491 Malignant neoplasm of unspecified part of right bronchus or lung: Secondary | ICD-10-CM

## 2015-09-28 DIAGNOSIS — J439 Emphysema, unspecified: Secondary | ICD-10-CM | POA: Diagnosis not present

## 2015-09-28 DIAGNOSIS — J189 Pneumonia, unspecified organism: Secondary | ICD-10-CM

## 2015-09-28 DIAGNOSIS — I2510 Atherosclerotic heart disease of native coronary artery without angina pectoris: Secondary | ICD-10-CM | POA: Insufficient documentation

## 2015-09-28 MED ORDER — IOPAMIDOL (ISOVUE-300) INJECTION 61%
75.0000 mL | Freq: Once | INTRAVENOUS | Status: AC | PRN
  Administered 2015-09-28: 75 mL via INTRAVENOUS

## 2015-09-30 ENCOUNTER — Inpatient Hospital Stay: Payer: Medicare Other | Attending: Internal Medicine

## 2015-09-30 ENCOUNTER — Encounter: Payer: Self-pay | Admitting: Internal Medicine

## 2015-09-30 ENCOUNTER — Inpatient Hospital Stay (HOSPITAL_BASED_OUTPATIENT_CLINIC_OR_DEPARTMENT_OTHER): Payer: Medicare Other | Admitting: Internal Medicine

## 2015-09-30 VITALS — BP 158/93 | HR 58 | Temp 96.4°F | Resp 18 | Wt 183.4 lb

## 2015-09-30 DIAGNOSIS — E785 Hyperlipidemia, unspecified: Secondary | ICD-10-CM | POA: Diagnosis not present

## 2015-09-30 DIAGNOSIS — Z86010 Personal history of colonic polyps: Secondary | ICD-10-CM | POA: Insufficient documentation

## 2015-09-30 DIAGNOSIS — Z87891 Personal history of nicotine dependence: Secondary | ICD-10-CM

## 2015-09-30 DIAGNOSIS — F209 Schizophrenia, unspecified: Secondary | ICD-10-CM

## 2015-09-30 DIAGNOSIS — C7802 Secondary malignant neoplasm of left lung: Secondary | ICD-10-CM | POA: Insufficient documentation

## 2015-09-30 DIAGNOSIS — C3411 Malignant neoplasm of upper lobe, right bronchus or lung: Secondary | ICD-10-CM | POA: Diagnosis not present

## 2015-09-30 DIAGNOSIS — I10 Essential (primary) hypertension: Secondary | ICD-10-CM

## 2015-09-30 DIAGNOSIS — Z923 Personal history of irradiation: Secondary | ICD-10-CM | POA: Insufficient documentation

## 2015-09-30 DIAGNOSIS — R5382 Chronic fatigue, unspecified: Secondary | ICD-10-CM | POA: Diagnosis not present

## 2015-09-30 DIAGNOSIS — C774 Secondary and unspecified malignant neoplasm of inguinal and lower limb lymph nodes: Secondary | ICD-10-CM | POA: Diagnosis not present

## 2015-09-30 DIAGNOSIS — K219 Gastro-esophageal reflux disease without esophagitis: Secondary | ICD-10-CM | POA: Diagnosis not present

## 2015-09-30 DIAGNOSIS — Z5112 Encounter for antineoplastic immunotherapy: Secondary | ICD-10-CM | POA: Insufficient documentation

## 2015-09-30 DIAGNOSIS — R918 Other nonspecific abnormal finding of lung field: Secondary | ICD-10-CM

## 2015-09-30 DIAGNOSIS — E039 Hypothyroidism, unspecified: Secondary | ICD-10-CM

## 2015-09-30 DIAGNOSIS — Z79899 Other long term (current) drug therapy: Secondary | ICD-10-CM | POA: Diagnosis not present

## 2015-09-30 DIAGNOSIS — C3491 Malignant neoplasm of unspecified part of right bronchus or lung: Secondary | ICD-10-CM

## 2015-09-30 DIAGNOSIS — R785 Finding of other psychotropic drug in blood: Secondary | ICD-10-CM

## 2015-09-30 LAB — COMPREHENSIVE METABOLIC PANEL
ALK PHOS: 69 U/L (ref 38–126)
ALT: 11 U/L — AB (ref 17–63)
ANION GAP: 4 — AB (ref 5–15)
AST: 18 U/L (ref 15–41)
Albumin: 4 g/dL (ref 3.5–5.0)
BUN: 17 mg/dL (ref 6–20)
CALCIUM: 9.2 mg/dL (ref 8.9–10.3)
CO2: 29 mmol/L (ref 22–32)
CREATININE: 1 mg/dL (ref 0.61–1.24)
Chloride: 105 mmol/L (ref 101–111)
GLUCOSE: 99 mg/dL (ref 65–99)
POTASSIUM: 4.2 mmol/L (ref 3.5–5.1)
SODIUM: 138 mmol/L (ref 135–145)
TOTAL PROTEIN: 6.5 g/dL (ref 6.5–8.1)
Total Bilirubin: 0.6 mg/dL (ref 0.3–1.2)

## 2015-09-30 LAB — CBC WITH DIFFERENTIAL/PLATELET
Basophils Absolute: 0 10*3/uL (ref 0–0.1)
Basophils Relative: 0 %
EOS ABS: 0 10*3/uL (ref 0–0.7)
EOS PCT: 0 %
HCT: 36 % — ABNORMAL LOW (ref 40.0–52.0)
HEMOGLOBIN: 12.4 g/dL — AB (ref 13.0–18.0)
LYMPHS ABS: 1.3 10*3/uL (ref 1.0–3.6)
LYMPHS PCT: 23 %
MCH: 30.6 pg (ref 26.0–34.0)
MCHC: 34.5 g/dL (ref 32.0–36.0)
MCV: 88.6 fL (ref 80.0–100.0)
MONOS PCT: 12 %
Monocytes Absolute: 0.7 10*3/uL (ref 0.2–1.0)
NEUTROS PCT: 65 %
Neutro Abs: 3.7 10*3/uL (ref 1.4–6.5)
Platelets: 264 10*3/uL (ref 150–440)
RBC: 4.06 MIL/uL — AB (ref 4.40–5.90)
RDW: 13.7 % (ref 11.5–14.5)
WBC: 5.7 10*3/uL (ref 3.8–10.6)

## 2015-09-30 NOTE — Assessment & Plan Note (Signed)
#   SQUAMOUS Cell LUNG CA- STAGE IV- [ tumor nodule in the contralateral lung/ recurrence]; s/p Tecentriq x3 cycles; Re-staging April 2017 CT- LUL Stable/slight increase in size [~53m]; after holding the treatment for 2 cycles June 2017- CT scan shows stable left upper lobe nodule/stable right perihilar radiation changes; but improvement of the lingular/left lower base nodules [likely infectious versus inflammatory].   # We will restart Tecentriq on June 21; and continue every 3 weeks.

## 2015-09-30 NOTE — Assessment & Plan Note (Signed)
#   Basilar nodules- Improved/some new-on a CT scan in April 2017; status post Levaquin holding immunotherapy 2 cycles- repeat CT scan shows improvement of the left lower lobe lung nodules-likely infectious versus inflammatory; unlikely malignancy. Reviewed the scans myself and with the patient's caregiver in detail.

## 2015-09-30 NOTE — Progress Notes (Signed)
[place] OFFICE PROGRESS NOTE  Patient Care Team: [name], MD as PCP - General (Internal Medicine)   SUMMARY OF ONCOLOGIC HISTORY:    INTERVAL HISTORY: patient is a poor historian given schizophrenia/accompanied by caregiver.   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 Stage IV Left lung nodule- not Bx)   # FEB 2017-STAGE IV [contralateral lung nodules] PET Recurrence/Progression [high risk for [facility] 2017- START Tecentriq q 3W x3; April 2017 CT- LUL Stable/slight increase in size [~42m]; new lingula ~233m improved RUL radiation changes; Improved basilar nodules ? Atypical or infectious etilogy  # Schizophrenia/ group home resident     Squamous cell carcinoma of right lung ([facility]  08/22/2014 Initial Diagnosis Squamous cell carcinoma of right lung (HCC)     INTERVAL HISTORY:  [age]ear old male patient with above history of squamous cell lung cancer is here for follow-up/to review the results of the CT scan.  No new shortness of breath or chest pain. No cough. No fever or chills. Gaining weight.  REVIEW OF SYSTEMS:  A complete 10 point review of system is done which is negative except mentioned above/history of present illness.   PAST MEDICAL HISTORY :  Past Medical History  Diagnosis Date  . Schizophrenia (HCW[facility]  . Diabetes mellitus without complication (HCS[place]  . Hypertension   . Hyperlipemia   . Chronic fatigue   . Hypothyroidism   . Chronic fatigue   . Colon polyps   . Squamous cell carcinoma of right lung (HCM[place]5/09/2014    PAST SURGICAL HISTORY :   Past Surgical  History  Procedure Laterality Date  . Colonoscopy      history of colon polyps  . Peripheral vascular catheterization N/A 08/21/2014    Procedure: PoGlori Luisath Insertion;  Surgeon: JaAlgernon HuxleyMD;  [facility];  Service: Cardiovascular;  Laterality: N/A;  . Endobronchial ultrasound  07/30/2014    right hilum FNA/EBUS    FAMILY HISTORY :  No family history on file.  SOCIAL HISTORY:   Social History  Substance Use Topics  . Smoking status: Former Smoker -- 0.25 packs/day for 42 years    Types: Cigarettes  . Smokeless tobacco: Never Used  . Alcohol Use: No    ALLERGIES:  has No Known Allergies.  MEDICATIONS:  Current Outpatient Prescriptions  Medication Sig Dispense Refill  . atorvastatin (LIPITOR) 10 MG tablet Take 10 mg by mouth every morning.     . calcium carbonate (TUMS - DOSED IN MG ELEMENTAL CALCIUM) 500 MG chewable tablet Chew 1 tablet by mouth 2 (two) times daily.    . ferrous sulfate 325 (65 FE) MG tablet Take 325 mg by mouth daily with breakfast.    . levothyroxine (SYNTHROID, LEVOTHROID) 88 MCG tablet Take 88 mcg by mouth every morning.     . nystatin (MYCOSTATIN) 100000 UNIT/ML suspension Take 5 ml up to 4 times a day as needed for discomfort in throat. ( swish and swallow). 480 mL 1  . OLANZapine (ZYPREXA) 20 MG tablet Take 30 mg by mouth at  bedtime.     . ondansetron (ZOFRAN-ODT) 4 MG disintegrating tablet Take 4 mg by mouth every 8 (eight) hours as needed for nausea or vomiting.    . prochlorperazine (COMPAZINE) 10 MG tablet Take 1 tablet (10 mg total) by mouth every 6 (six) hours as needed for nausea or vomiting. 30 tablet 6  . propranolol (INDERAL) 10 MG tablet Take 10 mg by mouth 3 (three) times daily.    . sucralfate (CARAFATE) 1 g tablet Take 1 g by mouth 4 (four) times daily -  with meals and at bedtime.     No current facility-administered medications for this visit.    PHYSICAL EXAMINATION: ECOG PERFORMANCE STATUS: 0 - Asymptomatic  BP  158/93 mmHg  Pulse 58  Temp(Src) 96.4 F (35.8 C) (Tympanic)  Resp 18  Wt 183 lb 6.8 oz (83.2 kg)  Filed Weights   09/30/15 1022  Weight: 183 lb 6.8 oz (83.2 kg)    GENERAL: Well-nourished well-developed; Alert, no distress and comfortable.  Accompanied by caregiver. EYES: no pallor or icterus OROPHARYNX: no thrush or ulceration; good dentition  NECK: supple, no masses felt LYMPH:  no palpable lymphadenopathy in the cervical, axillary or inguinal regions LUNGS: clear to auscultation and  No wheeze or crackles HEART/CVS: regular rate & rhythm and no murmurs; No lower extremity edema ABDOMEN:abdomen soft, non-tender and normal bowel sounds Musculoskeletal:no cyanosis of digits and no clubbing  PSYCH: alert & oriented x 3 with fluent speech NEURO: no focal motor/sensory deficits SKIN:  no rashes or significant lesions  LABORATORY DATA:  I have reviewed the data as listed    Component Value Date/Time   NA 138 09/30/2015 0850   NA 135 07/28/2014 1121   K 4.2 09/30/2015 0850   K 4.3 07/28/2014 1121   CL 105 09/30/2015 0850   CL 101 07/28/2014 1121   CO2 29 09/30/2015 0850   CO2 25 07/28/2014 1121   GLUCOSE 99 09/30/2015 0850   GLUCOSE 135* 07/28/2014 1121   BUN 17 09/30/2015 0850   BUN 19 07/28/2014 1121   CREATININE 1.00 09/30/2015 0850   CREATININE 1.36* 07/28/2014 1121   CALCIUM 9.2 09/30/2015 0850   CALCIUM 9.1 07/28/2014 1121   PROT 6.5 09/30/2015 0850   PROT 7.6 07/23/2014 1042   ALBUMIN 4.0 09/30/2015 0850   ALBUMIN 4.2 07/23/2014 1042   AST 18 09/30/2015 0850   AST 24 07/23/2014 1042   ALT 11* 09/30/2015 0850   ALT 24 07/23/2014 1042   ALKPHOS 69 09/30/2015 0850   ALKPHOS 108 07/23/2014 1042   BILITOT 0.6 09/30/2015 0850   BILITOT 0.5 07/23/2014 1042   GFRNONAA >60 09/30/2015 0850   GFRNONAA 57* 07/28/2014 1121   GFRAA >60 09/30/2015 0850   GFRAA >60 07/28/2014 1121    No results found for: SPEP, UPEP  Lab Results  Component Value Date   WBC 5.7  09/30/2015   NEUTROABS 3.7 09/30/2015   HGB 12.4* 09/30/2015   HCT 36.0* 09/30/2015   MCV 88.6 09/30/2015   PLT 264 09/30/2015      Chemistry      Component Value Date/Time   NA 138 09/30/2015 0850   NA 135 07/28/2014 1121   K 4.2 09/30/2015 0850   K 4.3 07/28/2014 1121   CL 105 09/30/2015 0850   CL 101 07/28/2014 1121   CO2 29 09/30/2015 0850   CO2 25 07/28/2014 1121   BUN 17 09/30/2015 0850   BUN 19 07/28/2014 1121   CREATININE 1.00 09/30/2015 0850  CREATININE 1.36* 07/28/2014 1121      Component Value Date/Time   CALCIUM 9.2 09/30/2015 0850   CALCIUM 9.1 07/28/2014 1121   ALKPHOS 69 09/30/2015 0850   ALKPHOS 108 07/23/2014 1042   AST 18 09/30/2015 0850   AST 24 07/23/2014 1042   ALT 11* 09/30/2015 0850   ALT 24 07/23/2014 1042   BILITOT 0.6 09/30/2015 0850   BILITOT 0.5 07/23/2014 1042       RADIOGRAPHIC STUDIES: I have personally reviewed the radiological images as listed and agreed with the findings in the report. No results found.   ASSESSMENT & PLAN:  Squamous cell carcinoma of right lung ([place]) # SQUAMOUS Cell LUNG CA- STAGE IV- [ tumor nodule in the contralateral lung/ recurrence]; s/p Tecentriq x3 cycles; Re-staging April 2017 CT- LUL Stable/slight increase in size [~35m]; after holding the treatment for 2 cycles June 2017- CT scan shows stable left upper lobe nodule/stable right perihilar radiation changes; but improvement of the lingular/left lower base nodules [likely infectious versus inflammatory].   # We will restart Tecentriq on June 21; and continue every 3 weeks.    Multiple lung nodules # Basilar nodules- Improved/some new-on a CT scan in April 2017; status post Levaquin holding immunotherapy 2 cycles- repeat CT scan shows improvement of the left lower lobe lung nodules-likely infectious versus inflammatory; unlikely malignancy. Reviewed the scans myself and with the patient's caregiver in detail.   Orders Placed This Encounter  Procedures   . CBC with Differential    Standing Status: Future     Number of Occurrences:      Standing Expiration Date: 09/29/2016  . Comprehensive metabolic panel    Standing Status: Future     Number of Occurrences:      Standing Expiration Date: 09/29/2016    Order Specific Question:  Has the patient fasted?    Answer:  No  . TSH    Standing Status: Future     Number of Occurrences:      Standing Expiration Date: 09/29/2016   All questions were answered. The patient knows to call the clinic with any problems, questions or concerns.      GCammie Sickle MD 09/30/2015 7:20 PM       GCammie Sickle MD 09/30/2015 10:34 AM

## 2015-10-08 ENCOUNTER — Inpatient Hospital Stay: Payer: Medicare Other

## 2015-10-08 VITALS — BP 130/84 | HR 73 | Temp 96.0°F | Resp 18

## 2015-10-08 DIAGNOSIS — C3491 Malignant neoplasm of unspecified part of right bronchus or lung: Secondary | ICD-10-CM

## 2015-10-08 DIAGNOSIS — Z5112 Encounter for antineoplastic immunotherapy: Secondary | ICD-10-CM | POA: Diagnosis not present

## 2015-10-08 MED ORDER — SODIUM CHLORIDE 0.9 % IV SOLN
Freq: Once | INTRAVENOUS | Status: AC | PRN
  Administered 2015-10-08: 10:00:00 via INTRAVENOUS
  Filled 2015-10-08 (×2): qty 4

## 2015-10-08 MED ORDER — SODIUM CHLORIDE 0.9% FLUSH
10.0000 mL | INTRAVENOUS | Status: DC | PRN
  Administered 2015-10-08: 10 mL
  Filled 2015-10-08: qty 10

## 2015-10-08 MED ORDER — LORAZEPAM 2 MG/ML IJ SOLN: 1.0000 mg | Freq: Once | INTRAMUSCULAR | Status: DC | PRN

## 2015-10-08 MED ORDER — SODIUM CHLORIDE 0.9 % IV SOLN
1200.0000 mg | Freq: Once | INTRAVENOUS | Status: AC
  Administered 2015-10-08: 1200 mg via INTRAVENOUS
  Filled 2015-10-08 (×2): qty 20

## 2015-10-08 MED ORDER — HEPARIN SOD (PORK) LOCK FLUSH 100 UNIT/ML IV SOLN
500.0000 [IU] | Freq: Once | INTRAVENOUS | Status: AC | PRN
  Administered 2015-10-08: 500 [IU]
  Filled 2015-10-08: qty 5

## 2015-10-08 MED ORDER — SODIUM CHLORIDE 0.9 % IV SOLN
Freq: Once | INTRAVENOUS | Status: AC
  Administered 2015-10-08: 10:00:00 via INTRAVENOUS
  Filled 2015-10-08: qty 1000

## 2015-10-28 ENCOUNTER — Inpatient Hospital Stay: Payer: Medicare Other

## 2015-10-28 ENCOUNTER — Inpatient Hospital Stay (HOSPITAL_BASED_OUTPATIENT_CLINIC_OR_DEPARTMENT_OTHER): Payer: Medicare Other | Admitting: Internal Medicine

## 2015-10-28 ENCOUNTER — Inpatient Hospital Stay: Payer: Medicare Other | Attending: Internal Medicine

## 2015-10-28 VITALS — BP 138/88 | HR 67 | Temp 96.8°F | Resp 18 | Wt 184.1 lb

## 2015-10-28 DIAGNOSIS — Z923 Personal history of irradiation: Secondary | ICD-10-CM | POA: Insufficient documentation

## 2015-10-28 DIAGNOSIS — C774 Secondary and unspecified malignant neoplasm of inguinal and lower limb lymph nodes: Secondary | ICD-10-CM | POA: Insufficient documentation

## 2015-10-28 DIAGNOSIS — I10 Essential (primary) hypertension: Secondary | ICD-10-CM

## 2015-10-28 DIAGNOSIS — F209 Schizophrenia, unspecified: Secondary | ICD-10-CM | POA: Diagnosis not present

## 2015-10-28 DIAGNOSIS — C3401 Malignant neoplasm of right main bronchus: Secondary | ICD-10-CM | POA: Insufficient documentation

## 2015-10-28 DIAGNOSIS — C3491 Malignant neoplasm of unspecified part of right bronchus or lung: Secondary | ICD-10-CM

## 2015-10-28 DIAGNOSIS — Z5112 Encounter for antineoplastic immunotherapy: Secondary | ICD-10-CM | POA: Diagnosis present

## 2015-10-28 DIAGNOSIS — E039 Hypothyroidism, unspecified: Secondary | ICD-10-CM | POA: Insufficient documentation

## 2015-10-28 DIAGNOSIS — Z87891 Personal history of nicotine dependence: Secondary | ICD-10-CM | POA: Diagnosis not present

## 2015-10-28 DIAGNOSIS — Z79899 Other long term (current) drug therapy: Secondary | ICD-10-CM | POA: Diagnosis not present

## 2015-10-28 DIAGNOSIS — C3411 Malignant neoplasm of upper lobe, right bronchus or lung: Secondary | ICD-10-CM | POA: Insufficient documentation

## 2015-10-28 DIAGNOSIS — E119 Type 2 diabetes mellitus without complications: Secondary | ICD-10-CM | POA: Diagnosis not present

## 2015-10-28 DIAGNOSIS — R918 Other nonspecific abnormal finding of lung field: Secondary | ICD-10-CM

## 2015-10-28 DIAGNOSIS — Z86010 Personal history of colonic polyps: Secondary | ICD-10-CM | POA: Insufficient documentation

## 2015-10-28 DIAGNOSIS — E785 Hyperlipidemia, unspecified: Secondary | ICD-10-CM | POA: Diagnosis not present

## 2015-10-28 DIAGNOSIS — C7802 Secondary malignant neoplasm of left lung: Secondary | ICD-10-CM

## 2015-10-28 LAB — COMPREHENSIVE METABOLIC PANEL
ALK PHOS: 83 U/L (ref 38–126)
ALT: 13 U/L — ABNORMAL LOW (ref 17–63)
ANION GAP: 5 (ref 5–15)
AST: 22 U/L (ref 15–41)
Albumin: 4 g/dL (ref 3.5–5.0)
BILIRUBIN TOTAL: 0.6 mg/dL (ref 0.3–1.2)
BUN: 15 mg/dL (ref 6–20)
CO2: 26 mmol/L (ref 22–32)
CREATININE: 0.74 mg/dL (ref 0.61–1.24)
Calcium: 9.2 mg/dL (ref 8.9–10.3)
Chloride: 105 mmol/L (ref 101–111)
GFR calc non Af Amer: >60 mL/min (ref >60)
GLUCOSE: 94 mg/dL (ref 65–99)
Potassium: 4 mmol/L (ref 3.5–5.1)
Sodium: 136 mmol/L (ref 135–145)
Total Protein: 6.7 g/dL (ref 6.5–8.1)

## 2015-10-28 LAB — CBC WITH DIFFERENTIAL/PLATELET
Basophils Absolute: 0 10*3/uL (ref 0–0.1)
Basophils Relative: 0 %
Eosinophils Absolute: 0 10*3/uL (ref 0–0.7)
Eosinophils Relative: 0 %
HEMATOCRIT: 34.2 % — AB (ref 40.0–52.0)
HEMOGLOBIN: 12 g/dL — AB (ref 13.0–18.0)
LYMPHS ABS: 1.1 10*3/uL (ref 1.0–3.6)
Lymphocytes Relative: 19 %
MCH: 31.2 pg (ref 26.0–34.0)
MCHC: 35.2 g/dL (ref 32.0–36.0)
MCV: 88.7 fL (ref 80.0–100.0)
MONO ABS: 0.6 10*3/uL (ref 0.2–1.0)
MONOS PCT: 12 %
NEUTROS ABS: 3.8 10*3/uL (ref 1.4–6.5)
NEUTROS PCT: 69 %
Platelets: 242 10*3/uL (ref 150–440)
RBC: 3.86 MIL/uL — ABNORMAL LOW (ref 4.40–5.90)
RDW: 13.3 % (ref 11.5–14.5)
WBC: 5.5 10*3/uL (ref 3.8–10.6)

## 2015-10-28 LAB — TSH: TSH: 1.875 u[IU]/mL (ref 0.350–4.500)

## 2015-10-28 MED ORDER — SODIUM CHLORIDE 0.9 % IV SOLN
Freq: Once | INTRAVENOUS | Status: AC
  Administered 2015-10-28: 10:00:00 via INTRAVENOUS
  Filled 2015-10-28: qty 1000

## 2015-10-28 MED ORDER — SODIUM CHLORIDE 0.9 % IV SOLN
Freq: Once | INTRAVENOUS | Status: AC | PRN
  Administered 2015-10-28: 11:00:00 via INTRAVENOUS
  Filled 2015-10-28 (×2): qty 4

## 2015-10-28 MED ORDER — HEPARIN SOD (PORK) LOCK FLUSH 100 UNIT/ML IV SOLN
500.0000 [IU] | Freq: Once | INTRAVENOUS | Status: DC | PRN
  Filled 2015-10-28: qty 5

## 2015-10-28 MED ORDER — LORAZEPAM 2 MG/ML IJ SOLN
1.0000 mg | Freq: Once | INTRAMUSCULAR | Status: AC | PRN
  Administered 2015-10-28: 1 mg via INTRAVENOUS
  Filled 2015-10-28: qty 1

## 2015-10-28 MED ORDER — SODIUM CHLORIDE 0.9% FLUSH
10.0000 mL | Freq: Once | INTRAVENOUS | Status: AC
  Administered 2015-10-28: 10 mL via INTRAVENOUS
  Filled 2015-10-28: qty 10

## 2015-10-28 MED ORDER — SODIUM CHLORIDE 0.9 % IV SOLN
1200.0000 mg | Freq: Once | INTRAVENOUS | Status: AC
  Administered 2015-10-28: 1200 mg via INTRAVENOUS
  Filled 2015-10-28: qty 20

## 2015-10-28 MED ORDER — HEPARIN SOD (PORK) LOCK FLUSH 100 UNIT/ML IV SOLN
500.0000 [IU] | Freq: Once | INTRAVENOUS | Status: AC
Start: 2015-10-28 — End: 2015-10-28
  Administered 2015-10-28: 500 [IU] via INTRAVENOUS

## 2015-10-28 NOTE — Progress Notes (Signed)
[place] OFFICE PROGRESS NOTE  Patient Care Team: [name], MD as PCP - General (Internal Medicine)   SUMMARY OF ONCOLOGIC HISTORY:    INTERVAL HISTORY: patient is a poor historian given schizophrenia/accompanied by caregiver.   [place] OFFICE PROGRESS NOTE  Patient Care Team: [name], MD as PCP - General (Internal Medicine)  Squamous cell carcinoma of right lung [facility])   Staging form: Lung, AJCC 7th Edition     Clinical stage from 07/17/2014: Stage IV (T4, N0, M1a) - Signed by Leia Alf, MD on 09/29/2014    Oncology History   # April 2016- SQUAMOUS CELL of LUNG ca [RUL] STAGE III; [EBUS-Right hilum];  (? Stage IV Left lung nodule- not Bx)   # FEB 2017-STAGE IV [contralateral lung nodules] PET Recurrence/Progression [high risk for [facility] 2017- START Tecentriq q 3W x3; April 2017 CT- LUL Stable/slight increase in size [~49m]; new lingula ~229m improved RUL radiation changes; Improved basilar nodules ? Atypical or infectious etilogy  # Schizophrenia/ group home resident     Squamous [place] carcinoma of right lung ([place]  08/22/2014 Initial Diagnosis Squamous cell carcinoma of right lung (HCC)    Malignant neoplasm of hilus of right lung ([facility]  10/28/2015 Initial Diagnosis [facility]    INTERVAL HISTORY:  [age]ear old male patient with above history ofMetastatic squamous cell lung cancer is here for follow-up/ To proceed with treatment.  No new shortness of breath or chest pain. No cough. No fever or chills. Gaining weight. No diarrhea. Patient continues to work.  REVIEW OF SYSTEMS:  A complete 10 point review of system is done which is negative except mentioned above/history of present illness.   PAST MEDICAL HISTORY :  Past Medical History  Diagnosis Date  . Schizophrenia (HCLav[place]  . Diabetes mellitus without complication (HCTriplett  . Hypertension   . Hyperlipemia   . Chronic  fatigue   . Hypothyroidism   . Chronic fatigue   . Colon polyps   . Squamous cell carcinoma of right lung (HCT[place]ul[place]5/09/2014    PAST SURGICAL HISTORY :   Past Surgical History  Procedure Laterality Date  . Colonoscopy      history of colon polyps  . Peripheral vascular catheterization N/A 08/21/2014    Procedure: PoGlori Luisath Insertion;  Surgeon: JaAlgernon HuxleyMD;  [facility];  Service: Cardiovascular;  Laterality: N/A;  . Endobronchial ultrasound  07/30/2014    right hilum FNA/EBUS    FAMILY HISTORY :  No family history on file.  SOCIAL HISTORY:   Social History  Substance Use Topics  . Smoking status: Former Smoker -- 0.25 packs/day for 42 years    Types: Cigarettes  . Smokeless tobacco: Never Used  . Alcohol Use: No    ALLERGIES:  has No Known Allergies.  MEDICATIONS:  Current Outpatient Prescriptions  Medication Sig Dispense Refill  . atorvastatin (LIPITOR) 10 MG tablet Take 10 mg by mouth every morning.     . calcium carbonate (TUMS - DOSED IN MG ELEMENTAL CALCIUM) 500 MG chewable tablet Chew 1 tablet by mouth 2 (two) times daily.    . ferrous sulfate 325 (65 FE) MG tablet Take 325 mg by mouth daily with breakfast.    . levothyroxine (SYNTHROID, LEVOTHROID) 88 MCG tablet Take 88 mcg by mouth every morning.     . nystatin (MYCOSTATIN) 100000 UNIT/ML suspension Take 5 ml up to 4 times a  day as needed for discomfort in throat. ( swish and swallow). 480 mL 1  . OLANZapine (ZYPREXA) 20 MG tablet Take 30 mg by mouth at bedtime.     . ondansetron (ZOFRAN-ODT) 4 MG disintegrating tablet Take 4 mg by mouth every 8 (eight) hours as needed for nausea or vomiting.    . prochlorperazine (COMPAZINE) 10 MG tablet Take 1 tablet (10 mg total) by mouth every 6 (six) hours as needed for nausea or vomiting. 30 tablet 6  . propranolol (INDERAL) 10 MG tablet Take 10 mg by mouth 3 (three) times daily.    . sucralfate (CARAFATE) 1 g tablet Take 1 g by mouth 4 (four) times daily -   with meals and at bedtime.     No current facility-administered medications for this visit.    PHYSICAL EXAMINATION: ECOG PERFORMANCE STATUS: 0 - Asymptomatic  BP 138/88 mmHg  Pulse 67  Temp(Src) 96.8 F (36 C) (Tympanic)  Resp 18  Wt 184 lb 1.4 oz (83.5 kg)  SpO2   Filed Weights   10/28/15 0919  Weight: 184 lb 1.4 oz (83.5 kg)    GENERAL: Well-nourished well-developed; Alert, no distress and comfortable.  Accompanied by caregiver. EYES: no pallor or icterus OROPHARYNX: no thrush or ulceration; good dentition  NECK: supple, no masses felt LYMPH:  no palpable lymphadenopathy in the cervical, axillary or inguinal regions LUNGS: clear to auscultation and  No wheeze or crackles HEART/CVS: regular rate & rhythm and no murmurs; No lower extremity edema ABDOMEN:abdomen soft, non-tender and normal bowel sounds Musculoskeletal:no cyanosis of digits and no clubbing  PSYCH: alert & oriented x 3 with fluent speech NEURO: no focal motor/sensory deficits SKIN:  no rashes or significant lesions  LABORATORY DATA:  I have reviewed the data as listed    Component Value Date/Time   NA 136 10/28/2015 0850   NA 135 07/28/2014 1121   K 4.0 10/28/2015 0850   K 4.3 07/28/2014 1121   CL 105 10/28/2015 0850   CL 101 07/28/2014 1121   CO2 26 10/28/2015 0850   CO2 25 07/28/2014 1121   GLUCOSE 94 10/28/2015 0850   GLUCOSE 135* 07/28/2014 1121   BUN 15 10/28/2015 0850   BUN 19 07/28/2014 1121   CREATININE 0.74 10/28/2015 0850   CREATININE 1.36* 07/28/2014 1121   CALCIUM 9.2 10/28/2015 0850   CALCIUM 9.1 07/28/2014 1121   PROT 6.7 10/28/2015 0850   PROT 7.6 07/23/2014 1042   ALBUMIN 4.0 10/28/2015 0850   ALBUMIN 4.2 07/23/2014 1042   AST 22 10/28/2015 0850   AST 24 07/23/2014 1042   ALT 13* 10/28/2015 0850   ALT 24 07/23/2014 1042   ALKPHOS 83 10/28/2015 0850   ALKPHOS 108 07/23/2014 1042   BILITOT 0.6 10/28/2015 0850   BILITOT 0.5 07/23/2014 1042   GFRNONAA >60 10/28/2015 0850    GFRNONAA 57* 07/28/2014 1121   GFRAA >60 10/28/2015 0850   GFRAA >60 07/28/2014 1121    No results found for: SPEP, UPEP  Lab Results  Component Value Date   WBC 5.5 10/28/2015   NEUTROABS 3.8 10/28/2015   HGB 12.0* 10/28/2015   HCT 34.2* 10/28/2015   MCV 88.7 10/28/2015   PLT 242 10/28/2015      Chemistry      Component Value Date/Time   NA 136 10/28/2015 0850   NA 135 07/28/2014 1121   K 4.0 10/28/2015 0850   K 4.3 07/28/2014 1121   CL 105 10/28/2015 0850   CL 101 07/28/2014 1121  CO2 26 10/28/2015 0850   CO2 25 07/28/2014 1121   BUN 15 10/28/2015 0850   BUN 19 07/28/2014 1121   CREATININE 0.74 10/28/2015 0850   CREATININE 1.36* 07/28/2014 1121      Component Value Date/Time   CALCIUM 9.2 10/28/2015 0850   CALCIUM 9.1 07/28/2014 1121   ALKPHOS 83 10/28/2015 0850   ALKPHOS 108 07/23/2014 1042   AST 22 10/28/2015 0850   AST 24 07/23/2014 1042   ALT 13* 10/28/2015 0850   ALT 24 07/23/2014 1042   BILITOT 0.6 10/28/2015 0850   BILITOT 0.5 07/23/2014 1042       RADIOGRAPHIC STUDIES: I have personally reviewed the radiological images as listed and agreed with the findings in the report. No results found.   ASSESSMENT & PLAN:  Malignant neoplasm of hilus of right lung ([place]) # SQUAMOUS Cell LUNG CA- STAGE IV- [ tumor nodule in the contralateral lung/ recurrence]; improvement noted in interm scan. No evidence of progression. Currently on tecentriq.   # proceed with treatment today. Patient tolerating treatment well. Labs unremarkable.  # Follow-up in 3 weeks' labs/treatment    Orders Placed This Encounter  Procedures  . CBC with Differential    Standing Status: Standing     Number of Occurrences: 12     Standing Expiration Date: 10/27/2016  . Comprehensive metabolic panel    Standing Status: Standing     Number of Occurrences: 12     Standing Expiration Date: 10/27/2016    Order Specific Question:  Has the patient fasted?    Answer:  No   All  questions were answered. The patient knows to call the clinic with any problems, questions or concerns.      [name], MD 10/28/2015 8:15 PM       [name], MD 10/28/2015 8:15 PM

## 2015-10-28 NOTE — Assessment & Plan Note (Deleted)
#   SQUAMOUS Cell LUNG CA- STAGE IV- [ tumor nodule in the contralateral lung/ recurrence]; improvement noted in interm scan. No evidence of progression. Currently on tecentriq.   # proceed with treatment today. Patient tolerating treatment well. Labs unremarkable.  # Follow-up in 3 weeks' labs/treatment

## 2015-10-28 NOTE — Assessment & Plan Note (Signed)
#   SQUAMOUS Cell LUNG CA- STAGE IV- [ tumor nodule in the contralateral lung/ recurrence]; improvement noted in interm scan. No evidence of progression. Currently on tecentriq.   # proceed with treatment today. Patient tolerating treatment well. Labs unremarkable.  # Follow-up in 3 weeks' labs/treatment

## 2015-11-10 ENCOUNTER — Encounter: Payer: Self-pay | Admitting: Radiation Oncology

## 2015-11-10 ENCOUNTER — Ambulatory Visit
Admission: RE | Admit: 2015-11-10 | Discharge: 2015-11-10 | Disposition: A | Discharge status: Home / Self Care | Payer: Medicare Other | Source: Ambulatory Visit | Attending: Radiation Oncology | Admitting: Radiation Oncology

## 2015-11-10 VITALS — BP 127/87 | HR 72 | Temp 97.0°F | Wt 180.0 lb

## 2015-11-10 DIAGNOSIS — Z923 Personal history of irradiation: Secondary | ICD-10-CM | POA: Diagnosis not present

## 2015-11-10 DIAGNOSIS — F09 Unspecified mental disorder due to known physiological condition: Secondary | ICD-10-CM | POA: Insufficient documentation

## 2015-11-10 DIAGNOSIS — C3491 Malignant neoplasm of unspecified part of right bronchus or lung: Secondary | ICD-10-CM

## 2015-11-10 DIAGNOSIS — C3401 Malignant neoplasm of right main bronchus: Secondary | ICD-10-CM | POA: Diagnosis not present

## 2015-11-10 NOTE — Progress Notes (Signed)
Radiation Oncology Follow up Note  Name: Travis Maddox   Date:   11/10/2015 MRN:  [id number] DOB: 04-Dec-1955    This 60 y.o. male presents to the clinic today for 1 year follow-up for stage IIIa carcinoma of the right lung status post concurrent chemoradiation.  REFERRING PROVIDER: [name], MD  HPI: Patient is a 60 year old male with significant cognitive disorder who received concurrent chemoradiation for stage IIIa squamous cell carcinoma the right hilum. He was noted on about 5 months ago to have some hypermetabolic activity to new regions in the right lung base and left upper lobe slightly hypermetabolic.Marland Kitchen He is currently on tecentriq And is tolerating that well. Specifically denies cough hemoptysis or chest tightness. Recent CT scan performed June shows stable CT the chest with no progression of disease. Nodule in the left upper lobe is unchanged.  COMPLICATIONS OF TREATMENT: none  FOLLOW UP COMPLIANCE: keeps appointments   PHYSICAL EXAM:  BP 127/87 (BP Location: Left Arm, Patient Position: Sitting)   Pulse 72   Temp 97 F (36.1 C)   Wt 180 lb 0.1 oz (81.7 kg)   BMI 24.41 kg/m  Well-developed male with obvious cognitive disorder in NAD. Well-developed well-nourished patient in NAD. HEENT reveals PERLA, EOMI, discs not visualized.  Oral cavity is clear. No oral mucosal lesions are identified. Neck is clear without evidence of cervical or supraclavicular adenopathy. Lungs are clear to A&P. Cardiac examination is essentially unremarkable with regular rate and rhythm without murmur rub or thrill. Abdomen is benign with no organomegaly or masses noted. Motor sensory and DTR levels are equal and symmetric in the upper and lower extremities. Cranial nerves II through XII are grossly intact. Proprioception is intact. No peripheral adenopathy or edema is identified. No motor or sensory levels are noted. Crude visual fields are within normal range.  RADIOLOGY RESULTS: CT scan is reviewed  and compatible with the above-stated findings showing stable chest  PLAN: At the present time he continues to do well on immunotherapy under medical oncology's direction. I'm going to turn follow-up care over to medical oncology. I would be happy to reevaluate patient at any time should further radiation treatments be indicated. It is difficult to transport the patient with to multiple providers since he does depend on outside resources. Family knows to call at anytime with any concerns.  I would like to take this opportunity to thank you for allowing me to participate in the care of your patient.Armstead Peaks., MD

## 2015-11-18 ENCOUNTER — Inpatient Hospital Stay: Payer: Medicare Other

## 2015-11-18 ENCOUNTER — Inpatient Hospital Stay: Payer: Medicare Other | Attending: Internal Medicine

## 2015-11-18 ENCOUNTER — Inpatient Hospital Stay (HOSPITAL_BASED_OUTPATIENT_CLINIC_OR_DEPARTMENT_OTHER): Payer: Medicare Other | Admitting: Internal Medicine

## 2015-11-18 DIAGNOSIS — I10 Essential (primary) hypertension: Secondary | ICD-10-CM

## 2015-11-18 DIAGNOSIS — Z923 Personal history of irradiation: Secondary | ICD-10-CM

## 2015-11-18 DIAGNOSIS — E119 Type 2 diabetes mellitus without complications: Secondary | ICD-10-CM

## 2015-11-18 DIAGNOSIS — Z87891 Personal history of nicotine dependence: Secondary | ICD-10-CM | POA: Insufficient documentation

## 2015-11-18 DIAGNOSIS — Z5112 Encounter for antineoplastic immunotherapy: Secondary | ICD-10-CM | POA: Insufficient documentation

## 2015-11-18 DIAGNOSIS — E785 Hyperlipidemia, unspecified: Secondary | ICD-10-CM | POA: Insufficient documentation

## 2015-11-18 DIAGNOSIS — C3491 Malignant neoplasm of unspecified part of right bronchus or lung: Secondary | ICD-10-CM

## 2015-11-18 DIAGNOSIS — C7802 Secondary malignant neoplasm of left lung: Secondary | ICD-10-CM

## 2015-11-18 DIAGNOSIS — E039 Hypothyroidism, unspecified: Secondary | ICD-10-CM | POA: Diagnosis not present

## 2015-11-18 DIAGNOSIS — C3411 Malignant neoplasm of upper lobe, right bronchus or lung: Secondary | ICD-10-CM | POA: Insufficient documentation

## 2015-11-18 DIAGNOSIS — Z79899 Other long term (current) drug therapy: Secondary | ICD-10-CM

## 2015-11-18 DIAGNOSIS — F209 Schizophrenia, unspecified: Secondary | ICD-10-CM

## 2015-11-18 DIAGNOSIS — Z86010 Personal history of colonic polyps: Secondary | ICD-10-CM

## 2015-11-18 DIAGNOSIS — C3401 Malignant neoplasm of right main bronchus: Secondary | ICD-10-CM

## 2015-11-18 DIAGNOSIS — R5382 Chronic fatigue, unspecified: Secondary | ICD-10-CM | POA: Diagnosis not present

## 2015-11-18 LAB — COMPREHENSIVE METABOLIC PANEL
ALBUMIN: 4 g/dL (ref 3.5–5.0)
ALK PHOS: 75 U/L (ref 38–126)
ALT: 13 U/L — ABNORMAL LOW (ref 17–63)
ANION GAP: 5 (ref 5–15)
AST: 20 U/L (ref 15–41)
BUN: 13 mg/dL (ref 6–20)
CALCIUM: 9 mg/dL (ref 8.9–10.3)
CO2: 27 mmol/L (ref 22–32)
Chloride: 104 mmol/L (ref 101–111)
Creatinine, Ser: 0.8 mg/dL (ref 0.61–1.24)
GFR calc non Af Amer: >60 mL/min (ref >60)
GLUCOSE: 91 mg/dL (ref 65–99)
POTASSIUM: 4.1 mmol/L (ref 3.5–5.1)
SODIUM: 136 mmol/L (ref 135–145)
Total Bilirubin: 0.6 mg/dL (ref 0.3–1.2)
Total Protein: 6.7 g/dL (ref 6.5–8.1)

## 2015-11-18 LAB — CBC WITH DIFFERENTIAL/PLATELET
BASOS PCT: 0 %
Basophils Absolute: 0 10*3/uL (ref 0–0.1)
EOS ABS: 0 10*3/uL (ref 0–0.7)
EOS PCT: 0 %
HCT: 36.6 % — ABNORMAL LOW (ref 40.0–52.0)
HEMOGLOBIN: 12.8 g/dL — AB (ref 13.0–18.0)
LYMPHS ABS: 1.3 10*3/uL (ref 1.0–3.6)
Lymphocytes Relative: 23 %
MCH: 31.5 pg (ref 26.0–34.0)
MCHC: 35 g/dL (ref 32.0–36.0)
MCV: 90.1 fL (ref 80.0–100.0)
MONO ABS: 0.6 10*3/uL (ref 0.2–1.0)
MONOS PCT: 10 %
NEUTROS PCT: 67 %
Neutro Abs: 4 10*3/uL (ref 1.4–6.5)
PLATELETS: 241 10*3/uL (ref 150–440)
RBC: 4.06 MIL/uL — ABNORMAL LOW (ref 4.40–5.90)
RDW: 13.4 % (ref 11.5–14.5)
WBC: 5.9 10*3/uL (ref 3.8–10.6)

## 2015-11-18 MED ORDER — SODIUM CHLORIDE 0.9 % IV SOLN
1200.0000 mg | Freq: Once | INTRAVENOUS | Status: AC
  Administered 2015-11-18: 1200 mg via INTRAVENOUS
  Filled 2015-11-18: qty 20

## 2015-11-18 MED ORDER — HEPARIN SOD (PORK) LOCK FLUSH 100 UNIT/ML IV SOLN
500.0000 [IU] | Freq: Once | INTRAVENOUS | Status: AC | PRN
  Administered 2015-11-18: 500 [IU]
  Filled 2015-11-18: qty 5

## 2015-11-18 MED ORDER — SODIUM CHLORIDE 0.9 % IV SOLN
Freq: Once | INTRAVENOUS | Status: AC
  Administered 2015-11-18: 10:00:00 via INTRAVENOUS
  Filled 2015-11-18: qty 1000

## 2015-11-18 MED ORDER — SODIUM CHLORIDE 0.9% FLUSH
10.0000 mL | INTRAVENOUS | Status: DC | PRN
  Administered 2015-11-18: 10 mL
  Filled 2015-11-18: qty 10

## 2015-11-18 NOTE — Assessment & Plan Note (Signed)
#   SQUAMOUS Cell LUNG CA- STAGE IV- [ tumor nodule in the contralateral lung/ recurrence]; improvement noted in interm scan- Juen 12th. No evidence of progression. Currently on tecentriq.  Will plan CT in September mid.   # proceed with treatment today. Patient tolerating treatment well. Labs unremarkable.  # Follow-up in 3 weeks' labs/treatment

## 2015-11-18 NOTE — Progress Notes (Signed)
[place] OFFICE PROGRESS NOTE  Travis Maddox Care Team: [name], MD as PCP - General (Internal Medicine)   SUMMARY OF ONCOLOGIC HISTORY:    INTERVAL HISTORY: Travis Maddox is a poor historian given schizophrenia/accompanied by caregiver.   [place] OFFICE PROGRESS NOTE  Travis Maddox Care Team: [name], MD as PCP - General (Internal Medicine)  Squamous cell carcinoma of right lung [facility])   Staging form: Lung, AJCC 7th Edition     Clinical stage from 07/17/2014: Stage IV (T4, N0, M1a) - Signed by Leia Alf, MD on 09/29/2014    Oncology History   # April 2016- SQUAMOUS CELL of LUNG ca [RUL] STAGE III; [EBUS-Right hilum];  (? Stage IV Left lung nodule- not Bx)   # FEB 2017-STAGE IV [contralateral lung nodules] PET Recurrence/Progression [high risk for [facility] 2017- START Tecentriq q 3W x3; April 2017 CT- LUL Stable/slight increase in size [~58m]; new lingula ~226m improved RUL radiation changes; Improved basilar nodules ? Atypical or infectious etilogy; JUne 12th CT-STABLE LUL.  # Schizophrenia/ group home resident     Squamous cell carcinoma of right lung (HCZumbro[place]  08/22/2014 Initial Diagnosis    Squamous cell carcinoma of right lung (HCC)      Malignant neoplasm of hilus of right lung (HCWaikoloa [place]  10/28/2015 Initial Diagnosis    [facility]      INTERVAL HISTORY:  [age]ear old male Travis Maddox with above history ofMetastatic squamous cell lung cancer is here for follow-up/ To proceed with treatment.  No significant diarrhea. He had 1 loose stool after last treatment.   No new shortness of breath or chest pain. No cough. No fever or chills. Gaining weight. Travis Maddox continues to work.  REVIEW OF SYSTEMS:  A complete 10 point review of system is done which is negative except mentioned above/history of present illness.   PAST MEDICAL HISTORY :  Past Medical History:  Diagnosis Date  . Chronic fatigue   .  Chronic fatigue   . Colon polyps   . Diabetes mellitus without complication (HCCherry [place]  . Hyperlipemia   . Hypertension   . Hypothyroidism   . Schizophrenia (HCPort [place]  . Squamous cell carcinoma of right lung (HCFair[place]5/09/2014    PAST SURGICAL HISTORY :   Past Surgical History:  Procedure Laterality Date  . COLONOSCOPY     history of colon polyps  . ENDOBRONCHIAL ULTRASOUND  07/30/2014   right hilum FNA/EBUS  . PERIPHERAL VASCULAR CATHETERIZATION N/A 08/21/2014   Procedure: PoGlori Luisath Insertion;  Surgeon: JaAlgernon HuxleyMD;  [facility];  Service: Cardiovascular;  Laterality: N/A;    FAMILY HISTORY :  No family history on file.  SOCIAL HISTORY:   Social History  Substance Use Topics  . Smoking status: Former Smoker    Packs/day: 0.25    Years: 42.00    Types: Cigarettes  . Smokeless tobacco: Never Used  . Alcohol use No    ALLERGIES:  has No Known Allergies.  MEDICATIONS:  Current Outpatient Prescriptions  Medication Sig Dispense Refill  . atorvastatin (LIPITOR) 10 MG tablet Take 10 mg by mouth every morning.     . calcium carbonate (TUMS - DOSED IN MG ELEMENTAL CALCIUM) 500 MG chewable tablet Chew 1 tablet by mouth 2 (two) times daily.    . ferrous sulfate 325 (65 FE) MG tablet Take 325 mg by mouth daily with breakfast.    . levothyroxine (SYNTHROID, LEVOTHROID) 88 MCG  tablet Take 88 mcg by mouth every morning.     . nystatin (MYCOSTATIN) 100000 UNIT/ML suspension Take 5 ml up to 4 times a day as needed for discomfort in throat. ( swish and swallow). 480 mL 1  . OLANZapine (ZYPREXA) 20 MG tablet Take 30 mg by mouth at bedtime.     . ondansetron (ZOFRAN-ODT) 4 MG disintegrating tablet Take 4 mg by mouth every 8 (eight) hours as needed for nausea or vomiting.    . prochlorperazine (COMPAZINE) 10 MG tablet Take 1 tablet (10 mg total) by mouth every 6 (six) hours as needed for nausea or vomiting. 30 tablet 6  . propranolol (INDERAL) 10 MG tablet Take 10 mg by mouth 3  (three) times daily.    . sucralfate (CARAFATE) 1 g tablet Take 1 g by mouth 4 (four) times daily -  with meals and at bedtime.     No current facility-administered medications for this visit.     PHYSICAL EXAMINATION: ECOG PERFORMANCE STATUS: 0 - Asymptomatic  BP (!) 142/91 (BP Location: Left Arm, Travis Maddox Position: Sitting)   Pulse (!) 50   Temp (!) 95.3 F (35.2 C) (Tympanic)   Resp 18   Wt 184 lb 6 oz (83.6 kg)   BMI 25.01 kg/m   Filed Weights   11/18/15 0904  Weight: 184 lb 6 oz (83.6 kg)    GENERAL: Well-nourished well-developed; Alert, no distress and comfortable.  Accompanied by caregiver. EYES: no pallor or icterus OROPHARYNX: no thrush or ulceration; good dentition  NECK: supple, no masses felt LYMPH:  no palpable lymphadenopathy in the cervical, axillary or inguinal regions LUNGS: clear to auscultation and  No wheeze or crackles HEART/CVS: regular rate & rhythm and no murmurs; No lower extremity edema ABDOMEN:abdomen soft, non-tender and normal bowel sounds Musculoskeletal:no cyanosis of digits and no clubbing  PSYCH: alert & oriented x 3 with fluent speech NEURO: no focal motor/sensory deficits SKIN:  no rashes or significant lesions  LABORATORY DATA:  I have reviewed the data as listed    Component Value Date/Time   NA 136 11/18/2015 0846   NA 135 07/28/2014 1121   K 4.1 11/18/2015 0846   K 4.3 07/28/2014 1121   CL 104 11/18/2015 0846   CL 101 07/28/2014 1121   CO2 27 11/18/2015 0846   CO2 25 07/28/2014 1121   GLUCOSE 91 11/18/2015 0846   GLUCOSE 135 (H) 07/28/2014 1121   BUN 13 11/18/2015 0846   BUN 19 07/28/2014 1121   CREATININE 0.80 11/18/2015 0846   CREATININE 1.36 (H) 07/28/2014 1121   CALCIUM 9.0 11/18/2015 0846   CALCIUM 9.1 07/28/2014 1121   PROT 6.7 11/18/2015 0846   PROT 7.6 07/23/2014 1042   ALBUMIN 4.0 11/18/2015 0846   ALBUMIN 4.2 07/23/2014 1042   AST 20 11/18/2015 0846   AST 24 07/23/2014 1042   ALT 13 (L) 11/18/2015 0846    ALT 24 07/23/2014 1042   ALKPHOS 75 11/18/2015 0846   ALKPHOS 108 07/23/2014 1042   BILITOT 0.6 11/18/2015 0846   BILITOT 0.5 07/23/2014 1042   GFRNONAA >60 11/18/2015 0846   GFRNONAA 57 (L) 07/28/2014 1121   GFRAA >60 11/18/2015 0846   GFRAA >60 07/28/2014 1121    No results found for: SPEP, UPEP  Lab Results  Component Value Date   WBC 5.9 11/18/2015   NEUTROABS 4.0 11/18/2015   HGB 12.8 (L) 11/18/2015   HCT 36.6 (L) 11/18/2015   MCV 90.1 11/18/2015   PLT 241 11/18/2015  Chemistry      Component Value Date/Time   NA 136 11/18/2015 0846   NA 135 07/28/2014 1121   K 4.1 11/18/2015 0846   K 4.3 07/28/2014 1121   CL 104 11/18/2015 0846   CL 101 07/28/2014 1121   CO2 27 11/18/2015 0846   CO2 25 07/28/2014 1121   BUN 13 11/18/2015 0846   BUN 19 07/28/2014 1121   CREATININE 0.80 11/18/2015 0846   CREATININE 1.36 (H) 07/28/2014 1121      Component Value Date/Time   CALCIUM 9.0 11/18/2015 0846   CALCIUM 9.1 07/28/2014 1121   ALKPHOS 75 11/18/2015 0846   ALKPHOS 108 07/23/2014 1042   AST 20 11/18/2015 0846   AST 24 07/23/2014 1042   ALT 13 (L) 11/18/2015 0846   ALT 24 07/23/2014 1042   BILITOT 0.6 11/18/2015 0846   BILITOT 0.5 07/23/2014 1042       RADIOGRAPHIC STUDIES: I have personally reviewed the radiological images as listed and agreed with the findings in the report. No results found.   ASSESSMENT & PLAN:  Malignant neoplasm of hilus of right lung ([place]) # SQUAMOUS Cell LUNG CA- STAGE IV- [ tumor nodule in the contralateral lung/ recurrence]; improvement noted in interm scan- Juen 12th. No evidence of progression. Currently on tecentriq.  Will plan CT in September mid.   # proceed with treatment today. Travis Maddox tolerating treatment well. Labs unremarkable.  # Follow-up in 3 weeks' labs/treatment   Orders Placed This Encounter  Procedures  . CBC with Differential    Standing Status:   Future    Standing Expiration Date:   11/17/2016  .  Comprehensive metabolic panel    Standing Status:   Future    Standing Expiration Date:   11/17/2016  . TSH    Standing Status:   Future    Standing Expiration Date:   11/17/2016   All questions were answered. The Travis Maddox knows to call the clinic with any problems, questions or concerns.      [name], MD 11/18/2015 6:03 PM       [name], MD 11/18/2015 6:03 PM

## 2015-12-09 ENCOUNTER — Inpatient Hospital Stay: Payer: Medicare Other

## 2015-12-09 ENCOUNTER — Inpatient Hospital Stay (HOSPITAL_BASED_OUTPATIENT_CLINIC_OR_DEPARTMENT_OTHER): Payer: Medicare Other | Admitting: Internal Medicine

## 2015-12-09 VITALS — BP 156/87 | HR 65 | Temp 95.7°F | Resp 18

## 2015-12-09 VITALS — BP 157/95 | HR 66 | Temp 96.3°F | Resp 16 | Ht 72.0 in | Wt 185.8 lb

## 2015-12-09 DIAGNOSIS — Z923 Personal history of irradiation: Secondary | ICD-10-CM

## 2015-12-09 DIAGNOSIS — E119 Type 2 diabetes mellitus without complications: Secondary | ICD-10-CM

## 2015-12-09 DIAGNOSIS — Z86010 Personal history of colonic polyps: Secondary | ICD-10-CM

## 2015-12-09 DIAGNOSIS — C3491 Malignant neoplasm of unspecified part of right bronchus or lung: Secondary | ICD-10-CM

## 2015-12-09 DIAGNOSIS — C3411 Malignant neoplasm of upper lobe, right bronchus or lung: Secondary | ICD-10-CM

## 2015-12-09 DIAGNOSIS — F209 Schizophrenia, unspecified: Secondary | ICD-10-CM

## 2015-12-09 DIAGNOSIS — I10 Essential (primary) hypertension: Secondary | ICD-10-CM

## 2015-12-09 DIAGNOSIS — R5382 Chronic fatigue, unspecified: Secondary | ICD-10-CM

## 2015-12-09 DIAGNOSIS — D039 Melanoma in situ, unspecified: Secondary | ICD-10-CM

## 2015-12-09 DIAGNOSIS — Z87891 Personal history of nicotine dependence: Secondary | ICD-10-CM

## 2015-12-09 DIAGNOSIS — C7802 Secondary malignant neoplasm of left lung: Secondary | ICD-10-CM | POA: Diagnosis not present

## 2015-12-09 DIAGNOSIS — Z79899 Other long term (current) drug therapy: Secondary | ICD-10-CM

## 2015-12-09 DIAGNOSIS — E785 Hyperlipidemia, unspecified: Secondary | ICD-10-CM

## 2015-12-09 DIAGNOSIS — C3401 Malignant neoplasm of right main bronchus: Secondary | ICD-10-CM

## 2015-12-09 DIAGNOSIS — Z5112 Encounter for antineoplastic immunotherapy: Secondary | ICD-10-CM | POA: Diagnosis not present

## 2015-12-09 LAB — COMPREHENSIVE METABOLIC PANEL
ALBUMIN: 4.3 g/dL (ref 3.5–5.0)
ALT: 14 U/L — AB (ref 17–63)
AST: 21 U/L (ref 15–41)
Alkaline Phosphatase: 74 U/L (ref 38–126)
Anion gap: 5 (ref 5–15)
BILIRUBIN TOTAL: 0.6 mg/dL (ref 0.3–1.2)
BUN: 14 mg/dL (ref 6–20)
CO2: 25 mmol/L (ref 22–32)
CREATININE: 0.77 mg/dL (ref 0.61–1.24)
Calcium: 9 mg/dL (ref 8.9–10.3)
Chloride: 104 mmol/L (ref 101–111)
GFR calc Af Amer: >60 mL/min (ref >60)
GLUCOSE: 99 mg/dL (ref 65–99)
POTASSIUM: 4 mmol/L (ref 3.5–5.1)
Sodium: 134 mmol/L — ABNORMAL LOW (ref 135–145)
TOTAL PROTEIN: 6.9 g/dL (ref 6.5–8.1)

## 2015-12-09 LAB — CBC WITH DIFFERENTIAL/PLATELET
BASOS PCT: 0 %
Basophils Absolute: 0 10*3/uL (ref 0–0.1)
EOS ABS: 0 10*3/uL (ref 0–0.7)
Eosinophils Relative: 0 %
HEMATOCRIT: 36.1 % — AB (ref 40.0–52.0)
Hemoglobin: 12.6 g/dL — ABNORMAL LOW (ref 13.0–18.0)
Lymphocytes Relative: 18 %
Lymphs Abs: 1.4 10*3/uL (ref 1.0–3.6)
MCH: 31.3 pg (ref 26.0–34.0)
MCHC: 34.8 g/dL (ref 32.0–36.0)
MCV: 89.9 fL (ref 80.0–100.0)
MONO ABS: 0.8 10*3/uL (ref 0.2–1.0)
MONOS PCT: 10 %
Neutro Abs: 5.6 10*3/uL (ref 1.4–6.5)
Neutrophils Relative %: 72 %
Platelets: 245 10*3/uL (ref 150–440)
RBC: 4.02 MIL/uL — ABNORMAL LOW (ref 4.40–5.90)
RDW: 13 % (ref 11.5–14.5)
WBC: 7.8 10*3/uL (ref 3.8–10.6)

## 2015-12-09 LAB — TSH: TSH: 1.5 u[IU]/mL (ref 0.350–4.500)

## 2015-12-09 MED ORDER — SODIUM CHLORIDE 0.9 % IV SOLN
Freq: Once | INTRAVENOUS | Status: AC | PRN
  Administered 2015-12-09: 12:00:00 via INTRAVENOUS
  Filled 2015-12-09: qty 4

## 2015-12-09 MED ORDER — SODIUM CHLORIDE 0.9 % IV SOLN
Freq: Once | INTRAVENOUS | Status: AC
  Administered 2015-12-09: 11:00:00 via INTRAVENOUS
  Filled 2015-12-09: qty 1000

## 2015-12-09 MED ORDER — LORAZEPAM 2 MG/ML IJ SOLN: 1.0000 mg | Freq: Once | INTRAMUSCULAR | Status: DC | PRN

## 2015-12-09 MED ORDER — HEPARIN SOD (PORK) LOCK FLUSH 100 UNIT/ML IV SOLN
INTRAVENOUS | Status: AC
  Filled 2015-12-09: qty 5

## 2015-12-09 MED ORDER — SODIUM CHLORIDE 0.9 % IJ SOLN
10.0000 mL | Freq: Once | INTRAMUSCULAR | Status: AC
  Administered 2015-12-09: 10 mL via INTRAVENOUS
  Filled 2015-12-09: qty 10

## 2015-12-09 MED ORDER — HEPARIN SOD (PORK) LOCK FLUSH 100 UNIT/ML IV SOLN
500.0000 [IU] | Freq: Once | INTRAVENOUS | Status: AC
  Administered 2015-12-09: 500 [IU] via INTRAVENOUS

## 2015-12-09 MED ORDER — SODIUM CHLORIDE 0.9 % IV SOLN
1200.0000 mg | Freq: Once | INTRAVENOUS | Status: AC
  Administered 2015-12-09: 1200 mg via INTRAVENOUS
  Filled 2015-12-09: qty 20

## 2015-12-09 NOTE — Progress Notes (Signed)
[place] OFFICE PROGRESS NOTE  [place] Care Team: [name], MD as PCP - General (Internal Medicine)   SUMMARY OF ONCOLOGIC HISTORY:    INTERVAL HISTORY: Maddox is a poor historian given schizophrenia/accompanied by caregiver.   [place] OFFICE PROGRESS NOTE  [place] Care Team: [name], MD as PCP - General (Internal Medicine)  Squamous cell carcinoma of right lung [facility])   Staging form: Lung, AJCC 7th Edition     Clinical stage from 07/17/2014: Stage IV (T4, N0, M1a) - Signed by Leia Alf, MD on 09/29/2014    Oncology History   # April 2016- SQUAMOUS CELL of LUNG ca [RUL] STAGE III; [EBUS-Right hilum];  (? Stage IV Left lung nodule- not Bx)   # FEB 2017-STAGE IV [contralateral lung nodules] PET Recurrence/Progression [high risk for [facility] 2017- START Tecentriq q 3W x3; April 2017 CT- LUL Stable/slight increase in size [~87m]; new lingula ~283m improved RUL radiation changes; Improved basilar nodules ? Atypical or infectious etilogy; JUne 12th CT-STABLE LUL.  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 Accompanied by caregiver.  Travis Maddox with above history ofMetastatic squamous cell lung cancer is here for follow-up/ currently on Tecentriq.   No new shortness of breath or chest pain. No cough. No fever or chills. Gaining weight. Maddox continues to work. No nausea no vomiting. No diarrhea.  REVIEW OF SYSTEMS:  Difficult to assess given his poor history giving skills.  PAST MEDICAL HISTORY :  Past Medical History:  Diagnosis Date  . Chronic fatigue   . Chronic fatigue   .  Colon polyps   . Diabetes mellitus without complication (HCA[facility]  . Hyperlipemia   . Hypertension   . Hypothyroidism   . Schizophrenia (HCK[place]  . Squamous cell carcinoma of right lung (HCCamden5/09/2014    PAST SURGICAL HISTORY :   Past Surgical History:  Procedure Laterality Date  . COLONOSCOPY     history of colon polyps  . ENDOBRONCHIAL ULTRASOUND  07/30/2014   right hilum FNA/EBUS  . PERIPHERAL VASCULAR CATHETERIZATION N/A 08/21/2014   Procedure: PoGlori Luisath Insertion;  Surgeon: JaAlgernon HuxleyMD;  [place];  Service: Cardiovascular;  Laterality: N/A;    FAMILY HISTORY :  No family history on file.  SOCIAL HISTORY:   Social History  Substance Use Topics  . Smoking status: Former Smoker    Packs/day: 0.25    Years: 42.00    Types: Cigarettes  . Smokeless tobacco: Never Used  . Alcohol use No    ALLERGIES:  has No Known Allergies.  MEDICATIONS:  Current Outpatient Prescriptions  Medication Sig Dispense Refill  . atorvastatin (LIPITOR) 10 MG tablet Take 10 mg by mouth every morning.     . calcium carbonate (TUMS - DOSED IN MG ELEMENTAL CALCIUM) 500 MG chewable tablet Chew 1 tablet by mouth 2 (two) times daily.    . ferrous sulfate 325 (65 FE) MG tablet Take 325 mg by mouth daily with breakfast.    . levothyroxine (SYNTHROID, LEVOTHROID) 88 MCG tablet Take 88 mcg by mouth every  morning.     . nystatin (MYCOSTATIN) 100000 UNIT/ML suspension Take 5 ml up to 4 times a day as needed for discomfort in throat. ( swish and swallow). 480 mL 1  . OLANZapine (ZYPREXA) 20 MG tablet Take 30 mg by mouth at bedtime.     . ondansetron (ZOFRAN-ODT) 4 MG disintegrating tablet Take 4 mg by mouth every 8 (eight) hours as needed for nausea or vomiting.    . prochlorperazine (COMPAZINE) 10 MG tablet Take 1 tablet (10 mg total) by mouth every 6 (six) hours as needed for nausea or vomiting. 30 tablet 6  . propranolol (INDERAL) 10 MG tablet Take 10 mg by mouth 3 (three) times daily.     . sucralfate (CARAFATE) 1 g tablet Take 1 g by mouth 4 (four) times daily -  with meals and at bedtime.     No current facility-administered medications for this visit.     PHYSICAL EXAMINATION: ECOG PERFORMANCE STATUS: 0 - Asymptomatic  BP (!) 157/95 (BP Location: Right Arm, [place] Position: Sitting)   Pulse 66   Temp (!) 96.3 F (35.7 C) (Tympanic)   Resp 16   Ht 6' (1.829 m)   Wt 185 lb 12.8 oz (84.3 kg)   SpO2 96%   BMI 25.20 kg/m   Filed Weights   12/09/15 1026  Weight: 185 lb 12.8 oz (84.3 kg)    GENERAL: Well-nourished well-developed; Alert, no distress and comfortable.  Accompanied by caregiver. EYES: no pallor or icterus OROPHARYNX: no thrush or ulceration; good dentition  NECK: supple, no masses felt LYMPH:  no palpable lymphadenopathy in the cervical, axillary or inguinal regions LUNGS: clear to auscultation and  No wheeze or crackles HEART/CVS: regular rate & rhythm and no murmurs; No lower extremity edema ABDOMEN:abdomen soft, non-tender and normal bowel sounds Musculoskeletal:no cyanosis of digits and no clubbing  PSYCH: alert & oriented x 3 with fluent speech NEURO: no focal motor/sensory deficits SKIN:  no rashes or significant lesions  LABORATORY DATA:  I have reviewed the data as listed    Component Value Date/Time   NA 134 (L) 12/09/2015 0921   NA 135 07/28/2014 1121   K 4.0 12/09/2015 0921   K 4.3 07/28/2014 1121   CL 104 12/09/2015 0921   CL 101 07/28/2014 1121   CO2 25 12/09/2015 0921   CO2 25 07/28/2014 1121   GLUCOSE 99 12/09/2015 0921   GLUCOSE 135 (H) 07/28/2014 1121   BUN 14 12/09/2015 0921   BUN 19 07/28/2014 1121   CREATININE 0.77 12/09/2015 0921   CREATININE 1.36 (H) 07/28/2014 1121   CALCIUM 9.0 12/09/2015 0921   CALCIUM 9.1 07/28/2014 1121   PROT 6.9 12/09/2015 0921   PROT 7.6 07/23/2014 1042   ALBUMIN 4.3 12/09/2015 0921   ALBUMIN 4.2 07/23/2014 1042   AST 21 12/09/2015 0921   AST 24 07/23/2014 1042   ALT 14 (L)  12/09/2015 0921   ALT 24 07/23/2014 1042   ALKPHOS 74 12/09/2015 0921   ALKPHOS 108 07/23/2014 1042   BILITOT 0.6 12/09/2015 0921   BILITOT 0.5 07/23/2014 1042   GFRNONAA >60 12/09/2015 0921   GFRNONAA 57 (L) 07/28/2014 1121   GFRAA >60 12/09/2015 0921   GFRAA >60 07/28/2014 1121    No results found for: SPEP, UPEP  Lab Results  Component Value Date   WBC 7.8 12/09/2015   NEUTROABS 5.6 12/09/2015   HGB 12.6 (L) 12/09/2015   HCT 36.1 (L) 12/09/2015   MCV 89.9 12/09/2015   PLT  245 12/09/2015      Chemistry      Component Value Date/Time   NA 134 (L) 12/09/2015 0921   NA 135 07/28/2014 1121   K 4.0 12/09/2015 0921   K 4.3 07/28/2014 1121   CL 104 12/09/2015 0921   CL 101 07/28/2014 1121   CO2 25 12/09/2015 0921   CO2 25 07/28/2014 1121   BUN 14 12/09/2015 0921   BUN 19 07/28/2014 1121   CREATININE 0.77 12/09/2015 0921   CREATININE 1.36 (H) 07/28/2014 1121      Component Value Date/Time   CALCIUM 9.0 12/09/2015 0921   CALCIUM 9.1 07/28/2014 1121   ALKPHOS 74 12/09/2015 0921   ALKPHOS 108 07/23/2014 1042   AST 21 12/09/2015 0921   AST 24 07/23/2014 1042   ALT 14 (L) 12/09/2015 0921   ALT 24 07/23/2014 1042   BILITOT 0.6 12/09/2015 0921   BILITOT 0.5 07/23/2014 1042       RADIOGRAPHIC STUDIES: I have personally reviewed the radiological images as listed and agreed with the findings in the report. No results found.   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 No evidence of progression. Currently on tecentriq.  Will order CT at netx visit.   # proceed with treatment today. Maddox tolerating treatment well. Labs unremarkable.  # Follow-up in 3 weeks' labs/treatment   No orders of the defined types were placed in this encounter.  All questions were answered. The [place] knows to call the clinic  with any problems, questions or concerns.      [name], MD 12/09/2015 5:30 PM       [name], MD 12/09/2015 5:30 PM

## 2015-12-09 NOTE — Assessment & Plan Note (Signed)
#   SQUAMOUS Cell LUNG CA- STAGE IV- [ tumor nodule in the contralateral lung/ recurrence]; improvement noted in interm scan- June 12th. No evidence of progression. Currently on tecentriq.  Will order CT at netx visit.   # proceed with treatment today. Patient tolerating treatment well. Labs unremarkable.  # Follow-up in 3 weeks' labs/treatment

## 2015-12-09 NOTE — Progress Notes (Signed)
Appetite improved.  Repots pt is about the same.

## 2015-12-24 ENCOUNTER — Ambulatory Visit: Payer: Medicare Other | Admitting: Internal Medicine

## 2015-12-24 ENCOUNTER — Other Ambulatory Visit: Payer: Medicare Other

## 2015-12-24 ENCOUNTER — Ambulatory Visit: Payer: Medicare Other

## 2015-12-30 ENCOUNTER — Inpatient Hospital Stay: Payer: Medicare Other | Attending: Internal Medicine | Admitting: Internal Medicine

## 2015-12-30 ENCOUNTER — Inpatient Hospital Stay: Payer: Medicare Other

## 2015-12-30 ENCOUNTER — Encounter: Payer: Self-pay | Admitting: Internal Medicine

## 2015-12-30 VITALS — BP 145/89 | HR 55 | Temp 95.9°F | Resp 17 | Ht 72.0 in | Wt 187.2 lb

## 2015-12-30 DIAGNOSIS — Z5112 Encounter for antineoplastic immunotherapy: Secondary | ICD-10-CM | POA: Insufficient documentation

## 2015-12-30 DIAGNOSIS — Z923 Personal history of irradiation: Secondary | ICD-10-CM

## 2015-12-30 DIAGNOSIS — I10 Essential (primary) hypertension: Secondary | ICD-10-CM | POA: Insufficient documentation

## 2015-12-30 DIAGNOSIS — Z79899 Other long term (current) drug therapy: Secondary | ICD-10-CM | POA: Insufficient documentation

## 2015-12-30 DIAGNOSIS — E039 Hypothyroidism, unspecified: Secondary | ICD-10-CM | POA: Insufficient documentation

## 2015-12-30 DIAGNOSIS — R911 Solitary pulmonary nodule: Secondary | ICD-10-CM | POA: Insufficient documentation

## 2015-12-30 DIAGNOSIS — Z87891 Personal history of nicotine dependence: Secondary | ICD-10-CM | POA: Diagnosis not present

## 2015-12-30 DIAGNOSIS — E785 Hyperlipidemia, unspecified: Secondary | ICD-10-CM | POA: Diagnosis not present

## 2015-12-30 DIAGNOSIS — C7802 Secondary malignant neoplasm of left lung: Secondary | ICD-10-CM | POA: Diagnosis not present

## 2015-12-30 DIAGNOSIS — E119 Type 2 diabetes mellitus without complications: Secondary | ICD-10-CM | POA: Insufficient documentation

## 2015-12-30 DIAGNOSIS — C3491 Malignant neoplasm of unspecified part of right bronchus or lung: Secondary | ICD-10-CM

## 2015-12-30 DIAGNOSIS — F209 Schizophrenia, unspecified: Secondary | ICD-10-CM | POA: Diagnosis not present

## 2015-12-30 DIAGNOSIS — C3401 Malignant neoplasm of right main bronchus: Secondary | ICD-10-CM | POA: Diagnosis not present

## 2015-12-30 LAB — CBC WITH DIFFERENTIAL/PLATELET
Basophils Absolute: 0 10*3/uL (ref 0–0.1)
Basophils Relative: 0 %
Eosinophils Absolute: 0 10*3/uL (ref 0–0.7)
Eosinophils Relative: 0 %
HEMATOCRIT: 34.7 % — AB (ref 40.0–52.0)
HEMOGLOBIN: 12.2 g/dL — AB (ref 13.0–18.0)
LYMPHS ABS: 1.2 10*3/uL (ref 1.0–3.6)
LYMPHS PCT: 22 %
MCH: 31.5 pg (ref 26.0–34.0)
MCHC: 35.1 g/dL (ref 32.0–36.0)
MCV: 89.7 fL (ref 80.0–100.0)
MONO ABS: 0.6 10*3/uL (ref 0.2–1.0)
MONOS PCT: 11 %
NEUTROS ABS: 3.7 10*3/uL (ref 1.4–6.5)
NEUTROS PCT: 67 %
Platelets: 243 10*3/uL (ref 150–440)
RBC: 3.87 MIL/uL — ABNORMAL LOW (ref 4.40–5.90)
RDW: 13.4 % (ref 11.5–14.5)
WBC: 5.4 10*3/uL (ref 3.8–10.6)

## 2015-12-30 LAB — COMPREHENSIVE METABOLIC PANEL
ALBUMIN: 3.9 g/dL (ref 3.5–5.0)
ALK PHOS: 73 U/L (ref 38–126)
ALT: 13 U/L — ABNORMAL LOW (ref 17–63)
ANION GAP: 6 (ref 5–15)
AST: 19 U/L (ref 15–41)
BILIRUBIN TOTAL: 0.4 mg/dL (ref 0.3–1.2)
BUN: 13 mg/dL (ref 6–20)
CALCIUM: 9 mg/dL (ref 8.9–10.3)
CO2: 27 mmol/L (ref 22–32)
Chloride: 104 mmol/L (ref 101–111)
Creatinine, Ser: 0.82 mg/dL (ref 0.61–1.24)
GLUCOSE: 91 mg/dL (ref 65–99)
Potassium: 4.1 mmol/L (ref 3.5–5.1)
Sodium: 137 mmol/L (ref 135–145)
TOTAL PROTEIN: 6.7 g/dL (ref 6.5–8.1)

## 2015-12-30 MED ORDER — SODIUM CHLORIDE 0.9% FLUSH
10.0000 mL | INTRAVENOUS | Status: DC | PRN
  Administered 2015-12-30: 10 mL
  Filled 2015-12-30: qty 10

## 2015-12-30 MED ORDER — SODIUM CHLORIDE 0.9 % IV SOLN
1200.0000 mg | Freq: Once | INTRAVENOUS | Status: AC
  Administered 2015-12-30: 1200 mg via INTRAVENOUS
  Filled 2015-12-30: qty 20

## 2015-12-30 MED ORDER — SODIUM CHLORIDE 0.9 % IV SOLN
Freq: Once | INTRAVENOUS | Status: AC
Start: 2015-12-30 — End: 2015-12-30
  Administered 2015-12-30: 10:00:00 via INTRAVENOUS
  Filled 2015-12-30: qty 1000

## 2015-12-30 MED ORDER — HEPARIN SOD (PORK) LOCK FLUSH 100 UNIT/ML IV SOLN
500.0000 [IU] | Freq: Once | INTRAVENOUS | Status: AC | PRN
  Administered 2015-12-30: 500 [IU]
  Filled 2015-12-30: qty 5

## 2015-12-30 NOTE — Progress Notes (Signed)
No changes since last visit. 

## 2015-12-30 NOTE — Assessment & Plan Note (Signed)
#   SQUAMOUS Cell LUNG CA- STAGE IV- [ tumor nodule in the contralateral lung/ recurrence]; improvement noted in interm scan- June 12th. No evidence of progression. Currently on tecentriq.    # proceed with treatment today. Patient tolerating treatment well. Labs unremarkable.  # Follow-up in 3 weeks' labs/treatment/ CT scan prior.

## 2015-12-30 NOTE — Progress Notes (Signed)
[place] OFFICE PROGRESS NOTE  Patient Care Team: [name], MD as PCP - General (Internal Medicine)   SUMMARY OF ONCOLOGIC HISTORY:    INTERVAL HISTORY: patient is a poor historian given schizophrenia/accompanied by caregiver.   [place] OFFICE PROGRESS NOTE  Patient Care Team: [name], MD as PCP - General (Internal Medicine)  Squamous cell carcinoma of right lung [facility])   Staging form: Lung, AJCC 7th Edition     Clinical stage from 07/17/2014: Stage IV (T4, N0, M1a) - Signed by Leia Alf, MD on 09/29/2014    Oncology History   # April 2016- SQUAMOUS CELL of LUNG ca [RUL] STAGE III; [EBUS-Right hilum];  (? Stage IV Left lung nodule- not Bx)   # FEB 2017-STAGE IV [contralateral lung nodules] PET Recurrence/Progression [high risk for [facility] 2017- START Tecentriq q 3W x3; April 2017 CT- LUL Stable/slight increase in size [~5m]; new lingula ~247m improved RUL radiation changes; Improved basilar nodules ? Atypical or infectious etilogy; JUne 12th CT-STABLE LUL.  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 Accompanied by caregiver.  [age]ear old male patient with above history ofMetastatic squamous cell lung cancer is here for follow-up/ currently on Tecentriq.     No nausea no vomiting. No diarrhea. No new shortness of breath or chest pain. No cough. No fever or chills. Gaining weight. Patient continues to work.  REVIEW OF SYSTEMS:  Difficult to assess given his poor history giving skills.  PAST MEDICAL HISTORY :  Past Medical History:  Diagnosis Date  . Chronic fatigue   . Chronic fatigue   .  Colon polyps   . Diabetes mellitus without complication (HCIroqu[place]  . Hyperlipemia   . Hypertension   . Hypothyroidism   . Schizophrenia (HCLucan  . Squamous cell carcinoma of right lung (HCDelaware5/09/2014    PAST SURGICAL HISTORY :   Past Surgical History:  Procedure Laterality Date  . COLONOSCOPY     history of colon polyps  . ENDOBRONCHIAL ULTRASOUND  07/30/2014   right hilum FNA/EBUS  . PERIPHERAL VASCULAR CATHETERIZATION N/A 08/21/2014   Procedure: PoGlori Luisath Insertion;  Surgeon: JaAlgernon HuxleyMD;  [place] [place];  Service: Cardiovascular;  Laterality: N/A;    FAMILY HISTORY :  History reviewed. No pertinent family history.  SOCIAL HISTORY:   Social History  Substance Use Topics  . Smoking status: Former Smoker    Packs/day: 0.25    Years: 42.00    Types: Cigarettes  . Smokeless tobacco: Never Used  . Alcohol use No    ALLERGIES:  has No Known Allergies.  MEDICATIONS:  Current Outpatient Prescriptions  Medication Sig Dispense Refill  . atorvastatin (LIPITOR) 10 MG tablet Take 10 mg by mouth every morning.     . calcium carbonate (TUMS - DOSED IN MG ELEMENTAL CALCIUM) 500 MG chewable tablet Chew 1 tablet by mouth 2 (two) times daily.    . ferrous sulfate 325 (65 FE) MG tablet Take 325 mg by mouth daily with breakfast.    . levothyroxine (SYNTHROID, LEVOTHROID) 88 MCG tablet Take 88 mcg  by mouth every morning.     . nystatin (MYCOSTATIN) 100000 UNIT/ML suspension Take 5 ml up to 4 times a day as needed for discomfort in throat. ( swish and swallow). 480 mL 1  . OLANZapine (ZYPREXA) 20 MG tablet Take 30 mg by mouth at bedtime.     . ondansetron (ZOFRAN-ODT) 4 MG disintegrating tablet Take 4 mg by mouth every 8 (eight) hours as needed for nausea or vomiting.    . prochlorperazine (COMPAZINE) 10 MG tablet Take 1 tablet (10 mg total) by mouth every 6 (six) hours as needed for nausea or vomiting. 30 tablet 6  . propranolol (INDERAL) 10 MG tablet Take 10 mg by mouth 3  (three) times daily.    . sucralfate (CARAFATE) 1 g tablet Take 1 g by mouth 4 (four) times daily -  with meals and at bedtime.     No current facility-administered medications for this visit.     PHYSICAL EXAMINATION: ECOG PERFORMANCE STATUS: 0 - Asymptomatic  BP (!) 145/89 (BP Location: Right Arm, Patient Position: Sitting)   Pulse (!) 55   Temp (!) 95.9 F (35.5 C) (Tympanic)   Resp 17   Ht 6' (1.829 m)   Wt 187 lb 3.2 oz (84.9 kg)   SpO2 95%   BMI 25.39 kg/m   Filed Weights   12/30/15 0906  Weight: 187 lb 3.2 oz (84.9 kg)    GENERAL: Well-nourished well-developed; Alert, no distress and comfortable.  Accompanied by caregiver. EYES: no pallor or icterus OROPHARYNX: no thrush or ulceration; good dentition  NECK: supple, no masses felt LYMPH:  no palpable lymphadenopathy in the cervical, axillary or inguinal regions LUNGS: clear to auscultation and  No wheeze or crackles HEART/CVS: regular rate & rhythm and no murmurs; No lower extremity edema ABDOMEN:abdomen soft, non-tender and normal bowel sounds Musculoskeletal:no cyanosis of digits and no clubbing  PSYCH: alert & oriented x 3 with fluent speech NEURO: no focal motor/sensory deficits SKIN:  no rashes or significant lesions  LABORATORY DATA:  I have reviewed the data as listed    Component Value Date/Time   NA 137 12/30/2015 0844   NA 135 07/28/2014 1121   K 4.1 12/30/2015 0844   K 4.3 07/28/2014 1121   CL 104 12/30/2015 0844   CL 101 07/28/2014 1121   CO2 27 12/30/2015 0844   CO2 25 07/28/2014 1121   GLUCOSE 91 12/30/2015 0844   GLUCOSE 135 (H) 07/28/2014 1121   BUN 13 12/30/2015 0844   BUN 19 07/28/2014 1121   CREATININE 0.82 12/30/2015 0844   CREATININE 1.36 (H) 07/28/2014 1121   CALCIUM 9.0 12/30/2015 0844   CALCIUM 9.1 07/28/2014 1121   PROT 6.7 12/30/2015 0844   PROT 7.6 07/23/2014 1042   ALBUMIN 3.9 12/30/2015 0844   ALBUMIN 4.2 07/23/2014 1042   AST 19 12/30/2015 0844   AST 24 07/23/2014 1042    ALT 13 (L) 12/30/2015 0844   ALT 24 07/23/2014 1042   ALKPHOS 73 12/30/2015 0844   ALKPHOS 108 07/23/2014 1042   BILITOT 0.4 12/30/2015 0844   BILITOT 0.5 07/23/2014 1042   GFRNONAA >60 12/30/2015 0844   GFRNONAA 57 (L) 07/28/2014 1121   GFRAA >60 12/30/2015 0844   GFRAA >60 07/28/2014 1121    No results found for: SPEP, UPEP  Lab Results  Component Value Date   WBC 5.4 12/30/2015   NEUTROABS 3.7 12/30/2015   HGB 12.2 (L) 12/30/2015   HCT 34.7 (L) 12/30/2015   MCV 89.7 12/30/2015  PLT 243 12/30/2015      Chemistry      Component Value Date/Time   NA 137 12/30/2015 0844   NA 135 07/28/2014 1121   K 4.1 12/30/2015 0844   K 4.3 07/28/2014 1121   CL 104 12/30/2015 0844   CL 101 07/28/2014 1121   CO2 27 12/30/2015 0844   CO2 25 07/28/2014 1121   BUN 13 12/30/2015 0844   BUN 19 07/28/2014 1121   CREATININE 0.82 12/30/2015 0844   CREATININE 1.36 (H) 07/28/2014 1121      Component Value Date/Time   CALCIUM 9.0 12/30/2015 0844   CALCIUM 9.1 07/28/2014 1121   ALKPHOS 73 12/30/2015 0844   ALKPHOS 108 07/23/2014 1042   AST 19 12/30/2015 0844   AST 24 07/23/2014 1042   ALT 13 (L) 12/30/2015 0844   ALT 24 07/23/2014 1042   BILITOT 0.4 12/30/2015 0844   BILITOT 0.5 07/23/2014 1042       RADIOGRAPHIC STUDIES: I have personally reviewed the radiological images as listed and agreed with the findings in the report. No results found.   ASSESSMENT & PLAN:  Malignant neoplasm of hilus of right lung ([place]) # SQUAMOUS Cell LUNG CA- STAGE IV- [ tumor nodule in the contralateral lung/ recurrence]; improvement noted in interm scan- June 12th. No evidence of progression. Currently on tecentriq.    # proceed with treatment today. Patient tolerating treatment well. Labs unremarkable.  # Follow-up in 3 weeks' labs/treatment/ CT scan prior.    Orders Placed This Encounter  Procedures  . CT CHEST W CONTRAST    Standing Status:   Future    Standing Expiration Date:    02/28/2017    Order Specific Question:   Reason for Exam (SYMPTOM  OR DIAGNOSIS REQUIRED)    Answer:   lung cancer    Order Specific Question:   Preferred imaging location?    Answer:   [facility]   All questions were answered. The patient knows to call the clinic with any problems, questions or concerns.      [name], MD 12/30/2015 5:22 PM       [name], MD 12/30/2015 5:22 PM

## 2016-01-07 IMAGING — CR DG CHEST 1V PORT
1 series · 1 of 1 positions shown · non-contrast
Comparison: None.

EXAM:
PORTABLE CHEST - 1 VIEW

[ap]
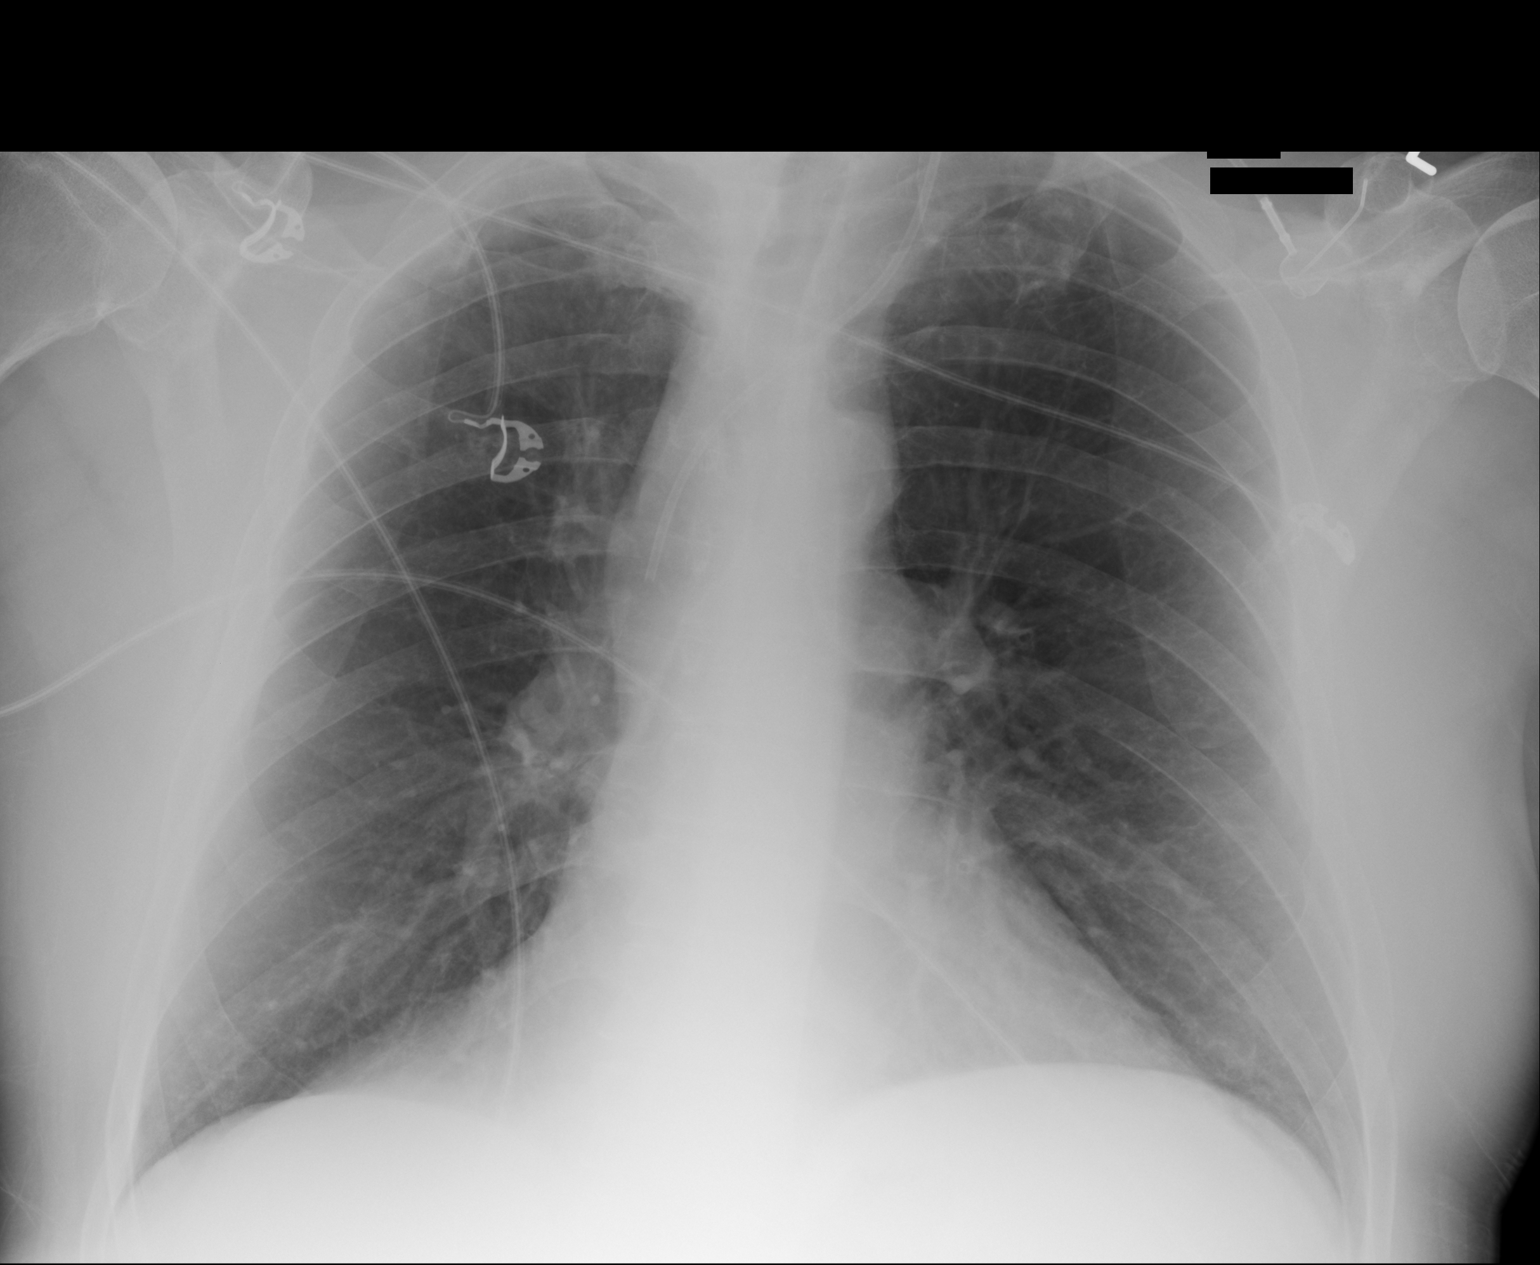

[1 of 1 positions shown; findings below may reference images not displayed]

FINDINGS: Left Port-A-Cath is in place with the tip in the SVC. Heart is
normal size. Lungs are clear. No effusions. No acute bony
abnormality.
IMPRESSION: No active disease.

## 2016-01-13 ENCOUNTER — Ambulatory Visit
Admission: RE | Admit: 2016-01-13 | Discharge: 2016-01-13 | Disposition: A | Discharge status: Home / Self Care | Payer: Medicare Other | Source: Ambulatory Visit | Attending: Internal Medicine | Admitting: Internal Medicine

## 2016-01-13 DIAGNOSIS — I2510 Atherosclerotic heart disease of native coronary artery without angina pectoris: Secondary | ICD-10-CM | POA: Diagnosis not present

## 2016-01-13 DIAGNOSIS — Z08 Encounter for follow-up examination after completed treatment for malignant neoplasm: Secondary | ICD-10-CM | POA: Insufficient documentation

## 2016-01-13 DIAGNOSIS — Z9221 Personal history of antineoplastic chemotherapy: Secondary | ICD-10-CM | POA: Insufficient documentation

## 2016-01-13 DIAGNOSIS — Z85118 Personal history of other malignant neoplasm of bronchus and lung: Secondary | ICD-10-CM | POA: Diagnosis not present

## 2016-01-13 DIAGNOSIS — C3401 Malignant neoplasm of right main bronchus: Secondary | ICD-10-CM

## 2016-01-13 MED ORDER — IOPAMIDOL (ISOVUE-300) INJECTION 61%
75.0000 mL | Freq: Once | INTRAVENOUS | Status: AC | PRN
  Administered 2016-01-13: 75 mL via INTRAVENOUS

## 2016-01-20 ENCOUNTER — Inpatient Hospital Stay: Payer: Medicare Other

## 2016-01-20 ENCOUNTER — Encounter: Payer: Self-pay | Admitting: Internal Medicine

## 2016-01-20 ENCOUNTER — Inpatient Hospital Stay: Payer: Medicare Other | Attending: Internal Medicine | Admitting: Internal Medicine

## 2016-01-20 VITALS — BP 166/91 | HR 64 | Temp 95.5°F | Resp 16 | Ht 72.0 in | Wt 190.0 lb

## 2016-01-20 DIAGNOSIS — Z87891 Personal history of nicotine dependence: Secondary | ICD-10-CM | POA: Diagnosis not present

## 2016-01-20 DIAGNOSIS — R05 Cough: Secondary | ICD-10-CM | POA: Diagnosis not present

## 2016-01-20 DIAGNOSIS — R1310 Dysphagia, unspecified: Secondary | ICD-10-CM | POA: Diagnosis not present

## 2016-01-20 DIAGNOSIS — Y842 Radiological procedure and radiotherapy as the cause of abnormal reaction of the patient, or of later complication, without mention of misadventure at the time of the procedure: Secondary | ICD-10-CM | POA: Insufficient documentation

## 2016-01-20 DIAGNOSIS — I10 Essential (primary) hypertension: Secondary | ICD-10-CM | POA: Diagnosis not present

## 2016-01-20 DIAGNOSIS — C3491 Malignant neoplasm of unspecified part of right bronchus or lung: Secondary | ICD-10-CM

## 2016-01-20 DIAGNOSIS — Z86010 Personal history of colonic polyps: Secondary | ICD-10-CM | POA: Insufficient documentation

## 2016-01-20 DIAGNOSIS — Z5112 Encounter for antineoplastic immunotherapy: Secondary | ICD-10-CM | POA: Insufficient documentation

## 2016-01-20 DIAGNOSIS — C3401 Malignant neoplasm of right main bronchus: Secondary | ICD-10-CM | POA: Diagnosis not present

## 2016-01-20 DIAGNOSIS — R911 Solitary pulmonary nodule: Secondary | ICD-10-CM | POA: Diagnosis not present

## 2016-01-20 DIAGNOSIS — F209 Schizophrenia, unspecified: Secondary | ICD-10-CM | POA: Diagnosis not present

## 2016-01-20 DIAGNOSIS — C7802 Secondary malignant neoplasm of left lung: Secondary | ICD-10-CM | POA: Diagnosis not present

## 2016-01-20 DIAGNOSIS — E119 Type 2 diabetes mellitus without complications: Secondary | ICD-10-CM | POA: Diagnosis not present

## 2016-01-20 DIAGNOSIS — Z79899 Other long term (current) drug therapy: Secondary | ICD-10-CM | POA: Insufficient documentation

## 2016-01-20 DIAGNOSIS — E039 Hypothyroidism, unspecified: Secondary | ICD-10-CM | POA: Diagnosis not present

## 2016-01-20 DIAGNOSIS — Z923 Personal history of irradiation: Secondary | ICD-10-CM | POA: Insufficient documentation

## 2016-01-20 DIAGNOSIS — E785 Hyperlipidemia, unspecified: Secondary | ICD-10-CM | POA: Insufficient documentation

## 2016-01-20 LAB — COMPREHENSIVE METABOLIC PANEL
ALK PHOS: 80 U/L (ref 38–126)
ALT: 16 U/L — AB (ref 17–63)
AST: 22 U/L (ref 15–41)
Albumin: 4.2 g/dL (ref 3.5–5.0)
Anion gap: 6 (ref 5–15)
BUN: 14 mg/dL (ref 6–20)
CALCIUM: 9.4 mg/dL (ref 8.9–10.3)
CO2: 27 mmol/L (ref 22–32)
CREATININE: 0.76 mg/dL (ref 0.61–1.24)
Chloride: 102 mmol/L (ref 101–111)
GFR calc non Af Amer: >60 mL/min (ref >60)
GLUCOSE: 98 mg/dL (ref 65–99)
Potassium: 4 mmol/L (ref 3.5–5.1)
SODIUM: 135 mmol/L (ref 135–145)
Total Bilirubin: 0.5 mg/dL (ref 0.3–1.2)
Total Protein: 6.8 g/dL (ref 6.5–8.1)

## 2016-01-20 LAB — CBC WITH DIFFERENTIAL/PLATELET
BASOS ABS: 0 10*3/uL (ref 0–0.1)
Basophils Relative: 0 %
EOS ABS: 0 10*3/uL (ref 0–0.7)
Eosinophils Relative: 0 %
HCT: 36 % — ABNORMAL LOW (ref 40.0–52.0)
HEMOGLOBIN: 12.7 g/dL — AB (ref 13.0–18.0)
LYMPHS ABS: 1.3 10*3/uL (ref 1.0–3.6)
LYMPHS PCT: 19 %
MCH: 31.6 pg (ref 26.0–34.0)
MCHC: 35.3 g/dL (ref 32.0–36.0)
MCV: 89.4 fL (ref 80.0–100.0)
Monocytes Absolute: 0.7 10*3/uL (ref 0.2–1.0)
Monocytes Relative: 10 %
NEUTROS PCT: 71 %
Neutro Abs: 4.8 10*3/uL (ref 1.4–6.5)
Platelets: 276 10*3/uL (ref 150–440)
RBC: 4.03 MIL/uL — AB (ref 4.40–5.90)
RDW: 13.1 % (ref 11.5–14.5)
WBC: 6.8 10*3/uL (ref 3.8–10.6)

## 2016-01-20 MED ORDER — LORAZEPAM 2 MG/ML IJ SOLN
1.0000 mg | Freq: Once | INTRAMUSCULAR | Status: AC | PRN
  Administered 2016-01-20: 1 mg via INTRAVENOUS
  Filled 2016-01-20: qty 1

## 2016-01-20 MED ORDER — SODIUM CHLORIDE 0.9 % IV SOLN
1200.0000 mg | Freq: Once | INTRAVENOUS | Status: AC
  Administered 2016-01-20: 1200 mg via INTRAVENOUS
  Filled 2016-01-20: qty 20

## 2016-01-20 MED ORDER — SODIUM CHLORIDE 0.9 % IV SOLN
Freq: Once | INTRAVENOUS | Status: AC | PRN
  Administered 2016-01-20: 12:00:00 via INTRAVENOUS
  Filled 2016-01-20: qty 4

## 2016-01-20 MED ORDER — SODIUM CHLORIDE 0.9 % IV SOLN
Freq: Once | INTRAVENOUS | Status: AC
  Administered 2016-01-20: 12:00:00 via INTRAVENOUS
  Filled 2016-01-20: qty 1000

## 2016-01-20 MED ORDER — HEPARIN SOD (PORK) LOCK FLUSH 100 UNIT/ML IV SOLN
500.0000 [IU] | Freq: Once | INTRAVENOUS | Status: AC | PRN
  Administered 2016-01-20: 500 [IU]
  Filled 2016-01-20: qty 5

## 2016-01-20 NOTE — Progress Notes (Signed)
CT last Wednesday and a flu shot today.   Pt had diarrhea last week.

## 2016-01-20 NOTE — Progress Notes (Signed)
[place] OFFICE PROGRESS NOTE  Patient Care Team: [name], MD as PCP - General (Internal Medicine)   SUMMARY OF ONCOLOGIC HISTORY:    INTERVAL HISTORY: patient is a poor historian given schizophrenia/accompanied by caregiver.   [place] OFFICE PROGRESS NOTE  Patient Care Team: [name], MD as PCP - General (Internal Medicine)  Squamous cell carcinoma of right lung [facility])   Staging form: Lung, AJCC 7th Edition     Clinical stage from 07/17/2014: Stage IV (T4, N0, M1a) - Signed by Leia Alf, MD on 09/29/2014    Oncology History   # April 2016- SQUAMOUS CELL of LUNG ca [RUL] STAGE III; [EBUS-Right hilum];  (? Stage IV Left lung nodule- not Bx)   # FEB 2017-STAGE IV [contralateral lung nodules] PET Recurrence/Progression [high risk for [facility] 2017- START Tecentriq q 3W x3; April 2017 CT- LUL Stable/slight increase in size [~34m]; new lingula ~275m improved RUL radiation changes; Improved basilar nodules ? Atypical or infectious etilogy; JUne 12th CT-STABLE LUL.  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 Accompanied by caregiver.  [age]ear old male patient with above history ofMetastatic squamous cell lung cancer is here for follow-up/ currently on Tecentriq.    Has gained some weight. Has mild cough. Not any worse.  No nausea no vomiting. No diarrhea. No new shortness of breath or chest pain. No cough. Patient continues to work. denies any headache or significant fatigue. As per his caregiver patient has had episodes of difficulty swallowing intermittently. To certain foods.  REVIEW OF SYSTEMS:   Difficult to assess given his poor history giving skills.  PAST MEDICAL HISTORY :  Past Medical History:  Diagnosis Date  . Chronic fatigue   . Chronic fatigue   . Colon polyps   . Diabetes mellitus without complication (HCUnion [place]  . Hyperlipemia   . Hypertension   . Hypothyroidism   . Schizophrenia (HCRancho Ch[place]  . Squamous cell carcinoma of right lung (HCYoakum5/09/2014    PAST SURGICAL HISTORY :   Past Surgical History:  Procedure Laterality Date  . COLONOSCOPY     history of colon polyps  . ENDOBRONCHIAL ULTRASOUND  07/30/2014   right hilum FNA/EBUS  . PERIPHERAL VASCULAR CATHETERIZATION N/A 08/21/2014   Procedure: PoGlori Luisath Insertion;  Surgeon: JaAlgernon HuxleyMD;  [facility];  Service: Cardiovascular;  Laterality: N/A;    FAMILY HISTORY :  History reviewed. No pertinent family history.  SOCIAL HISTORY:   Social History  Substance Use Topics  . Smoking status: Former Smoker    Packs/day: 0.25    Years: 42.00    Types: Cigarettes  . Smokeless tobacco: Never Used  . Alcohol use No    ALLERGIES:  has No Known Allergies.  MEDICATIONS:  Current Outpatient Prescriptions  Medication Sig Dispense Refill  . atorvastatin (LIPITOR) 10 MG tablet Take 10 mg by mouth every morning.     . calcium carbonate (TUMS - DOSED IN MG ELEMENTAL CALCIUM) 500 MG chewable tablet Chew 1 tablet by mouth 2 (two) times daily.    . ferrous sulfate 325 (  65 FE) MG tablet Take 325 mg by mouth daily with breakfast.    . levothyroxine (SYNTHROID, LEVOTHROID) 88 MCG tablet Take 88 mcg by mouth every morning.     . nystatin (MYCOSTATIN) 100000 UNIT/ML suspension Take 5 ml up to 4 times a day as needed for discomfort in throat. ( swish and swallow). 480 mL 1  . OLANZapine (ZYPREXA) 20 MG tablet Take 30 mg by mouth at bedtime.     . ondansetron (ZOFRAN-ODT) 4 MG disintegrating tablet Take 4 mg by mouth every 8 (eight) hours as needed for nausea or vomiting.    . prochlorperazine (COMPAZINE) 10 MG  tablet Take 1 tablet (10 mg total) by mouth every 6 (six) hours as needed for nausea or vomiting. 30 tablet 6  . propranolol (INDERAL) 10 MG tablet Take 10 mg by mouth 3 (three) times daily.    . sucralfate (CARAFATE) 1 g tablet Take 1 g by mouth 4 (four) times daily -  with meals and at bedtime.     No current facility-administered medications for this visit.     PHYSICAL EXAMINATION: ECOG PERFORMANCE STATUS: 0 - Asymptomatic  BP (!) 166/91 (BP Location: Left Arm, Patient Position: Sitting)   Pulse 64   Temp (!) 95.5 F (35.3 C) (Tympanic)   Resp 16   Ht 6' (1.829 m)   Wt 190 lb (86.2 kg)   BMI 25.77 kg/m   Filed Weights   01/20/16 1016  Weight: 190 lb (86.2 kg)    GENERAL: Well-nourished well-developed; Alert, no distress and comfortable.  Accompanied by caregiver. EYES: no pallor or icterus OROPHARYNX: no thrush or ulceration; good dentition  NECK: supple, no masses felt LYMPH:  no palpable lymphadenopathy in the cervical, axillary or inguinal regions LUNGS: clear to auscultation and  No wheeze or crackles HEART/CVS: regular rate & rhythm and no murmurs; No lower extremity edema ABDOMEN:abdomen soft, non-tender and normal bowel sounds Musculoskeletal:no cyanosis of digits and no clubbing  PSYCH: alert & oriented x 3 with fluent speech NEURO: no focal motor/sensory deficits SKIN:  no rashes or significant lesions  LABORATORY DATA:  I have reviewed the data as listed    Component Value Date/Time   NA 135 01/20/2016 0933   NA 135 07/28/2014 1121   K 4.0 01/20/2016 0933   K 4.3 07/28/2014 1121   CL 102 01/20/2016 0933   CL 101 07/28/2014 1121   CO2 27 01/20/2016 0933   CO2 25 07/28/2014 1121   GLUCOSE 98 01/20/2016 0933   GLUCOSE 135 (H) 07/28/2014 1121   BUN 14 01/20/2016 0933   BUN 19 07/28/2014 1121   CREATININE 0.76 01/20/2016 0933   CREATININE 1.36 (H) 07/28/2014 1121   CALCIUM 9.4 01/20/2016 0933   CALCIUM 9.1 07/28/2014 1121   PROT 6.8 01/20/2016 0933    PROT 7.6 07/23/2014 1042   ALBUMIN 4.2 01/20/2016 0933   ALBUMIN 4.2 07/23/2014 1042   AST 22 01/20/2016 0933   AST 24 07/23/2014 1042   ALT 16 (L) 01/20/2016 0933   ALT 24 07/23/2014 1042   ALKPHOS 80 01/20/2016 0933   ALKPHOS 108 07/23/2014 1042   BILITOT 0.5 01/20/2016 0933   BILITOT 0.5 07/23/2014 1042   GFRNONAA >60 01/20/2016 0933   GFRNONAA 57 (L) 07/28/2014 1121   GFRAA >60 01/20/2016 0933   GFRAA >60 07/28/2014 1121    No results found for: SPEP, UPEP  Lab Results  Component Value Date   WBC 6.8 01/20/2016   NEUTROABS 4.8 01/20/2016  HGB 12.7 (L) 01/20/2016   HCT 36.0 (L) 01/20/2016   MCV 89.4 01/20/2016   PLT 276 01/20/2016      Chemistry      Component Value Date/Time   NA 135 01/20/2016 0933   NA 135 07/28/2014 1121   K 4.0 01/20/2016 0933   K 4.3 07/28/2014 1121   CL 102 01/20/2016 0933   CL 101 07/28/2014 1121   CO2 27 01/20/2016 0933   CO2 25 07/28/2014 1121   BUN 14 01/20/2016 0933   BUN 19 07/28/2014 1121   CREATININE 0.76 01/20/2016 0933   CREATININE 1.36 (H) 07/28/2014 1121      Component Value Date/Time   CALCIUM 9.4 01/20/2016 0933   CALCIUM 9.1 07/28/2014 1121   ALKPHOS 80 01/20/2016 0933   ALKPHOS 108 07/23/2014 1042   AST 22 01/20/2016 0933   AST 24 07/23/2014 1042   ALT 16 (L) 01/20/2016 0933   ALT 24 07/23/2014 1042   BILITOT 0.5 01/20/2016 0933   BILITOT 0.5 07/23/2014 1042     IMPRESSION: Radiation changes in the right upper lobe/ perihilar region.  No evidence of metastatic disease.  Mild diffuse esophageal wall thickening, favored to reflect radiation changes.   Electronically Signed   By: Julian Hy M.D.   On: 01/13/2016 11:02  RADIOGRAPHIC STUDIES: I have personally reviewed the radiological images as listed and agreed with the findings in the report. No results found.   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 Currently on tecentriq.    # proceed with treatment today. Patient tolerating treatment well. Labs unremarkable.  # Dysphagia- ? Radiation changes; reocmmend GI evaluation if getting worse.  # Follow-up in 3 weeks' labs/treatment    Orders Placed This Encounter  Procedures  . TSH    Standing Status:   Future    Standing Expiration Date:   01/19/2017   All questions were answered. The patient knows to call the clinic with any problems, questions or concerns.      [name], MD 01/20/2016 5:53 PM       [name], MD 01/20/2016 5:53 PM

## 2016-01-20 NOTE — Assessment & Plan Note (Addendum)
#   SQUAMOUS Cell LUNG CA- STAGE IV- [ tumor nodule in the contralateral lung/ recurrence]; sep 27th CT- STABLE. Currently on tecentriq.    # proceed with treatment today. Patient tolerating treatment well. Labs unremarkable.  # Dysphagia- ? Radiation changes; reocmmend GI evaluation if getting worse.  # Follow-up in 3 weeks' labs/treatment

## 2016-02-09 NOTE — Progress Notes (Signed)
[place] OFFICE PROGRESS NOTE  Patient Care Team: [name], MD as PCP - General (Internal Medicine)   SUMMARY OF ONCOLOGIC HISTORY:    INTERVAL HISTORY: patient is a poor historian given schizophrenia/accompanied by caregiver.   [place] OFFICE PROGRESS NOTE  Patient Care Team: [name], MD as PCP - General (Internal Medicine)  Squamous cell carcinoma of right lung [facility])   Staging form: Lung, AJCC 7th Edition     Clinical stage from 07/17/2014: Stage IV (T4, N0, M1a) - Signed by Leia Alf, MD on 09/29/2014    Oncology History   # April 2016- SQUAMOUS CELL of LUNG ca [RUL] STAGE III; [EBUS-Right hilum];  (? Stage IV Left lung nodule- not Bx)   # FEB 2017-STAGE IV [contralateral lung nodules] PET Recurrence/Progression [high risk for [facility] 2017- START Tecentriq q 3W x3; April 2017 CT- LUL Stable/slight increase in size [~21m]; new lingula ~267m improved RUL radiation changes; Improved basilar nodules ? Atypical or infectious etilogy; JUne 12th CT-STABLE LUL.  # Schizophrenia/ group home resident     Squamous cell carcinoma of right lung (HC[place]  08/22/2014 Initial Diagnosis    Squamous cell carcinoma of right lung (HCMay[place]      Malignant neoplasm of hilus of right lung (HCMcSwain  10/28/2015 Initial Diagnosis    Malignant neoplasm of hilus of right lung (HCMarysville       INTERVAL HISTORY: Poor historian given his schizophrenia. Accompanied by caregiver.  [age]ear old male patient with above history ofMetastatic squamous cell lung cancer is here for follow-up/ currently on Tecentriq.  He continues to have mild cough that is unchanged. There is no report of nausea or vomiting. He denies any shortness of breath or chest pain. Patient continues to work. He denies any headache or significant fatigue. As per his caregiver patient he has been doing well and offers no new complaints.  REVIEW OF SYSTEMS:  Difficult to assess given his  underlying schizophrenia.  PAST MEDICAL HISTORY :  Past Medical History:  Diagnosis Date  . Chronic fatigue   . Chronic fatigue   . Colon polyps   . Diabetes mellitus without complication (HCJames City  . Hyperlipemia   . Hypertension   . Hypothyroidism   . Schizophrenia (HCWest Park  . Squamous cell carcinoma of right lung (HCLander5/09/2014    PAST SURGICAL HISTORY :   Past Surgical History:  Procedure Laterality Date  . COLONOSCOPY     history of colon polyps  . ENDOBRONCHIAL ULTRASOUND  07/30/2014   right hilum FNA/EBUS  . PERIPHERAL VASCULAR CATHETERIZATION N/A 08/21/2014   Procedure: PoGlori Luisath Insertion;  Surgeon: JaAlgernon HuxleyMD;  [facility];  Service: Cardiovascular;  Laterality: N/A;    FAMILY HISTORY :  History reviewed. No pertinent family history.  SOCIAL HISTORY:   Social History  Substance Use Topics  . Smoking status: Former Smoker    Packs/day: 0.25    Years: 42.00    Types: Cigarettes  . Smokeless tobacco: Never Used  . Alcohol use No    ALLERGIES:  has No Known Allergies.  MEDICATIONS:  Current Outpatient Prescriptions  Medication Sig Dispense Refill  . atorvastatin (LIPITOR) 10 MG tablet Take 10 mg by mouth every morning.     . calcium carbonate (TUMS - DOSED IN MG ELEMENTAL CALCIUM) 500 MG chewable tablet Chew 1 tablet by mouth 2 (two) times daily.    . ferrous sulfate 325 (65 FE) MG tablet  Take 325 mg by mouth daily with breakfast.    . levothyroxine (SYNTHROID, LEVOTHROID) 88 MCG tablet Take 88 mcg by mouth every morning.     Marland Kitchen lisinopril (PRINIVIL,ZESTRIL) 10 MG tablet Take 10 mg by mouth daily.    Marland Kitchen nystatin (MYCOSTATIN) 100000 UNIT/ML suspension Take 5 ml up to 4 times a day as needed for discomfort in throat. ( swish and swallow). 480 mL 1  . OLANZapine (ZYPREXA) 20 MG tablet Take 30 mg by mouth at bedtime.     . ondansetron (ZOFRAN-ODT) 4 MG disintegrating tablet Take 4 mg by mouth every 8 (eight) hours as needed for nausea or vomiting.     . prochlorperazine (COMPAZINE) 10 MG tablet Take 1 tablet (10 mg total) by mouth every 6 (six) hours as needed for nausea or vomiting. 30 tablet 6  . propranolol (INDERAL) 10 MG tablet Take 10 mg by mouth 3 (three) times daily.    . sucralfate (CARAFATE) 1 g tablet Take 1 g by mouth 4 (four) times daily -  with meals and at bedtime.     No current facility-administered medications for this visit.    Facility-Administered Medications Ordered in Other Visits  Medication Dose Route Frequency Provider Last Rate Last Dose  . sodium chloride flush (NS) 0.9 % injection 10 mL  10 mL Intracatheter PRN Lloyd Huger, MD   10 mL at 02/10/16 0940    PHYSICAL EXAMINATION: ECOG PERFORMANCE STATUS: 0 - Asymptomatic  BP (!) 141/93 (BP Location: Left Arm, Patient Position: Sitting)   Pulse 69   Temp (!) 95.5 F (35.3 C) (Tympanic)   Resp 17   Ht 6' (1.829 m)   Wt 194 lb 12.4 oz (88.3 kg)   BMI 26.42 kg/m   Filed Weights   02/10/16 1011  Weight: 194 lb 12.4 oz (88.3 kg)    GENERAL: Well-nourished well-developed; Alert, no distress and comfortable.  Accompanied by caregiver. EYES: no pallor or icterus OROPHARYNX: no thrush or ulceration; good dentition  NECK: supple, no masses felt LYMPH:  no palpable lymphadenopathy in the cervical, axillary or inguinal regions LUNGS: clear to auscultation and  No wheeze or crackles HEART/CVS: regular rate & rhythm and no murmurs; No lower extremity edema ABDOMEN:abdomen soft, non-tender and normal bowel sounds Musculoskeletal:no cyanosis of digits and no clubbing  PSYCH: alert & oriented x 3 with fluent speech NEURO: no focal motor/sensory deficits SKIN:  no rashes or significant lesions  LABORATORY DATA:  I have reviewed the data as listed    Component Value Date/Time   NA 139 02/10/2016 0931   NA 135 07/28/2014 1121   K 4.0 02/10/2016 0931   K 4.3 07/28/2014 1121   CL 104 02/10/2016 0931   CL 101 07/28/2014 1121   CO2 27 02/10/2016 0931    CO2 25 07/28/2014 1121   GLUCOSE 102 (H) 02/10/2016 0931   GLUCOSE 135 (H) 07/28/2014 1121   BUN 13 02/10/2016 0931   BUN 19 07/28/2014 1121   CREATININE 0.85 02/10/2016 0931   CREATININE 1.36 (H) 07/28/2014 1121   CALCIUM 9.5 02/10/2016 0931   CALCIUM 9.1 07/28/2014 1121   PROT 6.8 02/10/2016 0931   PROT 7.6 07/23/2014 1042   ALBUMIN 4.2 02/10/2016 0931   ALBUMIN 4.2 07/23/2014 1042   AST 22 02/10/2016 0931   AST 24 07/23/2014 1042   ALT 13 (L) 02/10/2016 0931   ALT 24 07/23/2014 1042   ALKPHOS 66 02/10/2016 0931   ALKPHOS 108 07/23/2014 1042   BILITOT  0.5 02/10/2016 0931   BILITOT 0.5 07/23/2014 1042   GFRNONAA >60 02/10/2016 0931   GFRNONAA 57 (L) 07/28/2014 1121   GFRAA >60 02/10/2016 0931   GFRAA >60 07/28/2014 1121    No results found for: SPEP, UPEP  Lab Results  Component Value Date   WBC 7.0 02/10/2016   NEUTROABS 5.0 02/10/2016   HGB 13.1 02/10/2016   HCT 37.0 (L) 02/10/2016   MCV 91.3 02/10/2016   PLT 226 02/10/2016      Chemistry      Component Value Date/Time   NA 139 02/10/2016 0931   NA 135 07/28/2014 1121   K 4.0 02/10/2016 0931   K 4.3 07/28/2014 1121   CL 104 02/10/2016 0931   CL 101 07/28/2014 1121   CO2 27 02/10/2016 0931   CO2 25 07/28/2014 1121   BUN 13 02/10/2016 0931   BUN 19 07/28/2014 1121   CREATININE 0.85 02/10/2016 0931   CREATININE 1.36 (H) 07/28/2014 1121      Component Value Date/Time   CALCIUM 9.5 02/10/2016 0931   CALCIUM 9.1 07/28/2014 1121   ALKPHOS 66 02/10/2016 0931   ALKPHOS 108 07/23/2014 1042   AST 22 02/10/2016 0931   AST 24 07/23/2014 1042   ALT 13 (L) 02/10/2016 0931   ALT 24 07/23/2014 1042   BILITOT 0.5 02/10/2016 0931   BILITOT 0.5 07/23/2014 1042     IMPRESSION: Radiation changes in the right upper lobe/ perihilar region.  No evidence of metastatic disease.  Mild diffuse esophageal wall thickening, favored to reflect radiation changes.   Electronically Signed   By: Julian Hy  M.D.   On: 01/13/2016 11:02  RADIOGRAPHIC STUDIES: I have personally reviewed the radiological images as listed and agreed with the findings in the report. No results found.   ASSESSMENT & PLAN:  # SQUAMOUS Cell LUNG CA- STAGE IV- [ tumor nodule in the contralateral lung/ recurrence]; sep 27th CT- STABLE. Currently on tecentriq.    # proceed with treatment today. Patient tolerating treatment well. Labs Continue to be unremarkable.  # Dysphagia- ? Radiation changes; recommend GI evaluation if getting worse.  # Follow-up in 3 weeks with M.D. evaluation, laboratory work, and continuation of Tecentriq   No orders of the defined types were placed in this encounter.  All questions were answered. The patient knows to call the clinic with any problems, questions or concerns.      Lloyd Huger, MD 02/14/2016 11:47 AM       Lloyd Huger, MD 02/14/2016 11:47 AM

## 2016-02-10 ENCOUNTER — Encounter: Payer: Self-pay | Admitting: Oncology

## 2016-02-10 ENCOUNTER — Inpatient Hospital Stay (HOSPITAL_BASED_OUTPATIENT_CLINIC_OR_DEPARTMENT_OTHER): Payer: Medicare Other | Admitting: Oncology

## 2016-02-10 ENCOUNTER — Inpatient Hospital Stay: Payer: Medicare Other

## 2016-02-10 VITALS — BP 141/93 | HR 69 | Temp 95.5°F | Resp 17 | Ht 72.0 in | Wt 194.8 lb

## 2016-02-10 DIAGNOSIS — I10 Essential (primary) hypertension: Secondary | ICD-10-CM

## 2016-02-10 DIAGNOSIS — Z87891 Personal history of nicotine dependence: Secondary | ICD-10-CM

## 2016-02-10 DIAGNOSIS — Z5112 Encounter for antineoplastic immunotherapy: Secondary | ICD-10-CM | POA: Diagnosis not present

## 2016-02-10 DIAGNOSIS — C3401 Malignant neoplasm of right main bronchus: Secondary | ICD-10-CM

## 2016-02-10 DIAGNOSIS — E785 Hyperlipidemia, unspecified: Secondary | ICD-10-CM

## 2016-02-10 DIAGNOSIS — C3491 Malignant neoplasm of unspecified part of right bronchus or lung: Secondary | ICD-10-CM

## 2016-02-10 DIAGNOSIS — Z79899 Other long term (current) drug therapy: Secondary | ICD-10-CM

## 2016-02-10 DIAGNOSIS — E039 Hypothyroidism, unspecified: Secondary | ICD-10-CM

## 2016-02-10 DIAGNOSIS — R911 Solitary pulmonary nodule: Secondary | ICD-10-CM | POA: Diagnosis not present

## 2016-02-10 DIAGNOSIS — F209 Schizophrenia, unspecified: Secondary | ICD-10-CM

## 2016-02-10 DIAGNOSIS — R1310 Dysphagia, unspecified: Secondary | ICD-10-CM

## 2016-02-10 DIAGNOSIS — R05 Cough: Secondary | ICD-10-CM

## 2016-02-10 DIAGNOSIS — C7802 Secondary malignant neoplasm of left lung: Secondary | ICD-10-CM | POA: Diagnosis not present

## 2016-02-10 DIAGNOSIS — E119 Type 2 diabetes mellitus without complications: Secondary | ICD-10-CM

## 2016-02-10 DIAGNOSIS — Z923 Personal history of irradiation: Secondary | ICD-10-CM

## 2016-02-10 DIAGNOSIS — Z86010 Personal history of colonic polyps: Secondary | ICD-10-CM

## 2016-02-10 DIAGNOSIS — Y842 Radiological procedure and radiotherapy as the cause of abnormal reaction of the patient, or of later complication, without mention of misadventure at the time of the procedure: Secondary | ICD-10-CM

## 2016-02-10 LAB — CBC WITH DIFFERENTIAL/PLATELET
BASOS PCT: 0 %
Basophils Absolute: 0 10*3/uL (ref 0–0.1)
EOS ABS: 0 10*3/uL (ref 0–0.7)
Eosinophils Relative: 0 %
HCT: 37 % — ABNORMAL LOW (ref 40.0–52.0)
Hemoglobin: 13.1 g/dL (ref 13.0–18.0)
Lymphocytes Relative: 19 %
Lymphs Abs: 1.3 10*3/uL (ref 1.0–3.6)
MCH: 32.3 pg (ref 26.0–34.0)
MCHC: 35.4 g/dL (ref 32.0–36.0)
MCV: 91.3 fL (ref 80.0–100.0)
MONO ABS: 0.7 10*3/uL (ref 0.2–1.0)
MONOS PCT: 10 %
Neutro Abs: 5 10*3/uL (ref 1.4–6.5)
Neutrophils Relative %: 71 %
Platelets: 226 10*3/uL (ref 150–440)
RBC: 4.05 MIL/uL — ABNORMAL LOW (ref 4.40–5.90)
RDW: 13.2 % (ref 11.5–14.5)
WBC: 7 10*3/uL (ref 3.8–10.6)

## 2016-02-10 LAB — COMPREHENSIVE METABOLIC PANEL
ALBUMIN: 4.2 g/dL (ref 3.5–5.0)
ALT: 13 U/L — ABNORMAL LOW (ref 17–63)
ANION GAP: 8 (ref 5–15)
AST: 22 U/L (ref 15–41)
Alkaline Phosphatase: 66 U/L (ref 38–126)
BILIRUBIN TOTAL: 0.5 mg/dL (ref 0.3–1.2)
BUN: 13 mg/dL (ref 6–20)
CO2: 27 mmol/L (ref 22–32)
Calcium: 9.5 mg/dL (ref 8.9–10.3)
Chloride: 104 mmol/L (ref 101–111)
Creatinine, Ser: 0.85 mg/dL (ref 0.61–1.24)
GFR calc non Af Amer: >60 mL/min (ref >60)
GLUCOSE: 102 mg/dL — AB (ref 65–99)
POTASSIUM: 4 mmol/L (ref 3.5–5.1)
SODIUM: 139 mmol/L (ref 135–145)
TOTAL PROTEIN: 6.8 g/dL (ref 6.5–8.1)

## 2016-02-10 LAB — TSH: TSH: 1.604 u[IU]/mL (ref 0.350–4.500)

## 2016-02-10 MED ORDER — LORAZEPAM 2 MG/ML IJ SOLN
1.0000 mg | Freq: Once | INTRAMUSCULAR | Status: AC | PRN
  Administered 2016-02-10: 1 mg via INTRAVENOUS
  Filled 2016-02-10: qty 1

## 2016-02-10 MED ORDER — HEPARIN SOD (PORK) LOCK FLUSH 100 UNIT/ML IV SOLN
500.0000 [IU] | Freq: Once | INTRAVENOUS | Status: AC | PRN
  Administered 2016-02-10: 500 [IU]
  Filled 2016-02-10: qty 5

## 2016-02-10 MED ORDER — SODIUM CHLORIDE 0.9% FLUSH
10.0000 mL | INTRAVENOUS | Status: DC | PRN
  Administered 2016-02-10: 10 mL
  Filled 2016-02-10: qty 10

## 2016-02-10 MED ORDER — SODIUM CHLORIDE 0.9 % IV SOLN
Freq: Once | INTRAVENOUS | Status: AC
  Administered 2016-02-10: 12:00:00 via INTRAVENOUS
  Filled 2016-02-10: qty 1000

## 2016-02-10 MED ORDER — SODIUM CHLORIDE 0.9 % IV SOLN
1200.0000 mg | Freq: Once | INTRAVENOUS | Status: AC
  Administered 2016-02-10: 1200 mg via INTRAVENOUS
  Filled 2016-02-10: qty 20

## 2016-02-10 MED ORDER — ONDANSETRON HCL 40 MG/20ML IJ SOLN: Freq: Once | INTRAMUSCULAR | Status: DC | PRN

## 2016-02-10 MED ORDER — ONDANSETRON 8 MG PO TBDP
8.0000 mg | ORAL_TABLET | Freq: Once | ORAL | Status: AC
  Administered 2016-02-10: 8 mg via ORAL
  Filled 2016-02-10: qty 1

## 2016-02-10 NOTE — Progress Notes (Signed)
No concerns. 

## 2016-03-02 ENCOUNTER — Inpatient Hospital Stay: Payer: Medicare Other

## 2016-03-02 ENCOUNTER — Other Ambulatory Visit: Payer: Medicare Other

## 2016-03-02 ENCOUNTER — Inpatient Hospital Stay: Payer: Medicare Other | Attending: Internal Medicine | Admitting: Internal Medicine

## 2016-03-02 ENCOUNTER — Ambulatory Visit: Payer: Medicare Other | Admitting: Internal Medicine

## 2016-03-02 ENCOUNTER — Ambulatory Visit: Payer: Medicare Other

## 2016-03-02 VITALS — BP 159/97 | HR 66 | Temp 97.6°F | Resp 18 | Wt 199.8 lb

## 2016-03-02 DIAGNOSIS — I10 Essential (primary) hypertension: Secondary | ICD-10-CM | POA: Insufficient documentation

## 2016-03-02 DIAGNOSIS — Y842 Radiological procedure and radiotherapy as the cause of abnormal reaction of the patient, or of later complication, without mention of misadventure at the time of the procedure: Secondary | ICD-10-CM | POA: Insufficient documentation

## 2016-03-02 DIAGNOSIS — R5382 Chronic fatigue, unspecified: Secondary | ICD-10-CM | POA: Insufficient documentation

## 2016-03-02 DIAGNOSIS — C3401 Malignant neoplasm of right main bronchus: Secondary | ICD-10-CM

## 2016-03-02 DIAGNOSIS — E039 Hypothyroidism, unspecified: Secondary | ICD-10-CM | POA: Insufficient documentation

## 2016-03-02 DIAGNOSIS — Z5112 Encounter for antineoplastic immunotherapy: Secondary | ICD-10-CM | POA: Insufficient documentation

## 2016-03-02 DIAGNOSIS — Z87891 Personal history of nicotine dependence: Secondary | ICD-10-CM | POA: Insufficient documentation

## 2016-03-02 DIAGNOSIS — Z79899 Other long term (current) drug therapy: Secondary | ICD-10-CM | POA: Diagnosis not present

## 2016-03-02 DIAGNOSIS — K229 Disease of esophagus, unspecified: Secondary | ICD-10-CM

## 2016-03-02 DIAGNOSIS — F209 Schizophrenia, unspecified: Secondary | ICD-10-CM | POA: Diagnosis not present

## 2016-03-02 DIAGNOSIS — Z923 Personal history of irradiation: Secondary | ICD-10-CM | POA: Diagnosis not present

## 2016-03-02 DIAGNOSIS — E119 Type 2 diabetes mellitus without complications: Secondary | ICD-10-CM

## 2016-03-02 DIAGNOSIS — C3491 Malignant neoplasm of unspecified part of right bronchus or lung: Secondary | ICD-10-CM

## 2016-03-02 DIAGNOSIS — R1310 Dysphagia, unspecified: Secondary | ICD-10-CM | POA: Diagnosis not present

## 2016-03-02 DIAGNOSIS — E785 Hyperlipidemia, unspecified: Secondary | ICD-10-CM | POA: Diagnosis not present

## 2016-03-02 DIAGNOSIS — C7802 Secondary malignant neoplasm of left lung: Secondary | ICD-10-CM | POA: Diagnosis not present

## 2016-03-02 LAB — CBC WITH DIFFERENTIAL/PLATELET
BASOS ABS: 0 10*3/uL (ref 0–0.1)
Basophils Relative: 0 %
EOS PCT: 0 %
Eosinophils Absolute: 0 10*3/uL (ref 0–0.7)
HEMATOCRIT: 34.8 % — AB (ref 40.0–52.0)
Hemoglobin: 12 g/dL — ABNORMAL LOW (ref 13.0–18.0)
LYMPHS PCT: 22 %
Lymphs Abs: 1.4 10*3/uL (ref 1.0–3.6)
MCH: 31.4 pg (ref 26.0–34.0)
MCHC: 34.6 g/dL (ref 32.0–36.0)
MCV: 90.8 fL (ref 80.0–100.0)
Monocytes Absolute: 0.7 10*3/uL (ref 0.2–1.0)
Monocytes Relative: 11 %
NEUTROS ABS: 4 10*3/uL (ref 1.4–6.5)
NEUTROS PCT: 67 %
PLATELETS: 243 10*3/uL (ref 150–440)
RBC: 3.83 MIL/uL — AB (ref 4.40–5.90)
RDW: 12.9 % (ref 11.5–14.5)
WBC: 6.1 10*3/uL (ref 3.8–10.6)

## 2016-03-02 LAB — COMPREHENSIVE METABOLIC PANEL
ALT: 16 U/L — AB (ref 17–63)
AST: 20 U/L (ref 15–41)
Albumin: 3.9 g/dL (ref 3.5–5.0)
Alkaline Phosphatase: 67 U/L (ref 38–126)
Anion gap: 6 (ref 5–15)
BUN: 19 mg/dL (ref 6–20)
CHLORIDE: 105 mmol/L (ref 101–111)
CO2: 27 mmol/L (ref 22–32)
CREATININE: 0.87 mg/dL (ref 0.61–1.24)
Calcium: 9 mg/dL (ref 8.9–10.3)
GFR calc Af Amer: >60 mL/min (ref >60)
Glucose, Bld: 90 mg/dL (ref 65–99)
Potassium: 4.2 mmol/L (ref 3.5–5.1)
Sodium: 138 mmol/L (ref 135–145)
Total Bilirubin: 0.6 mg/dL (ref 0.3–1.2)
Total Protein: 6.2 g/dL — ABNORMAL LOW (ref 6.5–8.1)

## 2016-03-02 MED ORDER — SODIUM CHLORIDE 0.9 % IV SOLN
Freq: Once | INTRAVENOUS | Status: AC
  Administered 2016-03-02: 10:00:00 via INTRAVENOUS
  Filled 2016-03-02: qty 1000

## 2016-03-02 MED ORDER — HEPARIN SOD (PORK) LOCK FLUSH 100 UNIT/ML IV SOLN
500.0000 [IU] | Freq: Once | INTRAVENOUS | Status: AC | PRN
  Administered 2016-03-02: 500 [IU]
  Filled 2016-03-02: qty 5

## 2016-03-02 MED ORDER — ONDANSETRON 8 MG PO TBDP
8.0000 mg | ORAL_TABLET | Freq: Once | ORAL | Status: AC
  Administered 2016-03-02: 8 mg via ORAL
  Filled 2016-03-02: qty 1

## 2016-03-02 MED ORDER — SODIUM CHLORIDE 0.9 % IV SOLN: Freq: Once | INTRAVENOUS | Status: DC | PRN

## 2016-03-02 MED ORDER — SODIUM CHLORIDE 0.9 % IV SOLN
1200.0000 mg | Freq: Once | INTRAVENOUS | Status: AC
  Administered 2016-03-02: 1200 mg via INTRAVENOUS
  Filled 2016-03-02: qty 20

## 2016-03-02 MED ORDER — LORAZEPAM 2 MG/ML IJ SOLN
1.0000 mg | Freq: Once | INTRAMUSCULAR | Status: AC | PRN
  Administered 2016-03-02: 1 mg via INTRAVENOUS
  Filled 2016-03-02: qty 1

## 2016-03-02 NOTE — Assessment & Plan Note (Addendum)
#   SQUAMOUS Cell LUNG CA- STAGE IV- [ tumor nodule in the contralateral lung/ recurrence]; sep 27th CT- STABLE. Currently on tecentriq. No concerns for clinical progression.     # proceed with treatment today. Patient tolerating treatment well. Labs unremarkable. Will get CT scan in end of Dec 2017.   # Dysphagia- ? Radiation changes; improved; gaining weight.   # Follow-up in 3 weeks' labs/treatment

## 2016-03-02 NOTE — Progress Notes (Signed)
Patient is here for follow up he is doing well

## 2016-03-02 NOTE — Progress Notes (Signed)
[place] OFFICE PROGRESS NOTE  Patient Care Team: [name] as PCP - General (Internal Medicine)   SUMMARY OF ONCOLOGIC HISTORY:    INTERVAL HISTORY: patient is a poor historian given schizophrenia/accompanied by caregiver.   [place] OFFICE PROGRESS NOTE  Patient Care Team: [name] as PCP - General (Internal Medicine)  Squamous cell carcinoma of right lung [facility])   Staging form: Lung, AJCC 7th Edition     Clinical stage from 07/17/2014: Stage IV (T4, N0, M1a) - Signed by Leia Alf, MD on 09/29/2014    Oncology History   # April 2016- SQUAMOUS CELL of LUNG ca [RUL] STAGE III; [EBUS-Right hilum];  (? Stage IV Left lung nodule- not Bx)   # FEB 2017-STAGE IV [contralateral lung nodules] PET Recurrence/Progression [high risk for [facility] 2017- START Tecentriq q 3W x3; April 2017 CT- LUL Stable/slight increase in size [~70m]; new lingula ~234m improved RUL radiation changes; Improved basilar nodules ? Atypical or infectious etilogy; JUne 12th CT-STABLE LUL.  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 Accompanied by caregiver.  [age]ear old male patient with above history ofMetastatic squamous cell lung cancer is here for follow-up/ currently on Tecentriq.    No shortness of breath or cough. Appetite is good. No weight loss or diarrhea. Patient is gaining weight. No headaches.  REVIEW OF SYSTEMS:  Difficult to assess given his poor history giving skills.  PAST MEDICAL HISTORY :  Past Medical History:  Diagnosis Date  . Chronic fatigue   . Chronic fatigue   . Colon polyps   . Diabetes mellitus  without complication (HCWalnut Creek  . Hyperlipemia   . Hypertension   . Hypothyroidism   . Schizophrenia (HCBarnstable  . Squamous cell carcinoma of right lung ([facility]CD[facility]5/09/2014    PAST SURGICAL HISTORY :   Past Surgical History:  Procedure Laterality Date  . COLONOSCOPY     history of colon polyps  . ENDOBRONCHIAL ULTRASOUND  07/30/2014   right hilum FNA/EBUS  . PERIPHERAL VASCULAR CATHETERIZATION N/A 08/21/2014   Procedure: PoGlori Luisath Insertion;  Surgeon: JaAlgernon HuxleyMD;  [facility];  Service: Cardiovascular;  Laterality: N/A;    FAMILY HISTORY :  No family history on file.  SOCIAL HISTORY:   Social History  Substance Use Topics  . Smoking status: Former Smoker    Packs/day: 0.25    Years: 42.00    Types: Cigarettes  . Smokeless tobacco: Never Used  . Alcohol use No    ALLERGIES:  has No Known Allergies.  MEDICATIONS:  Current Outpatient Prescriptions  Medication Sig Dispense Refill  . atorvastatin (LIPITOR) 10 MG tablet Take 10 mg by mouth every morning.     . calcium carbonate (TUMS - DOSED IN MG ELEMENTAL CALCIUM) 500 MG chewable tablet Chew 1 tablet by mouth 2 (two) times daily.    . ferrous sulfate 325 (65 FE) MG tablet Take 325 mg by mouth daily with breakfast.    . levothyroxine (SYNTHROID, LEVOTHROID) 88 MCG tablet Take 88 mcg by mouth every morning.     . Marland Kitchenisinopril (  PRINIVIL,ZESTRIL) 10 MG tablet Take 10 mg by mouth daily.    Marland Kitchen OLANZapine (ZYPREXA) 20 MG tablet Take 30 mg by mouth at bedtime.     . prochlorperazine (COMPAZINE) 10 MG tablet Take 1 tablet (10 mg total) by mouth every 6 (six) hours as needed for nausea or vomiting. 30 tablet 6  . propranolol (INDERAL) 10 MG tablet Take 10 mg by mouth 3 (three) times daily.    . sucralfate (CARAFATE) 1 g tablet Take 1 g by mouth 4 (four) times daily -  with meals and at bedtime.    Marland Kitchen nystatin (MYCOSTATIN) 100000 UNIT/ML suspension Take 5 ml up to 4 times a day as needed for discomfort in throat. ( swish and  swallow). (Patient not taking: Reported on 03/02/2016) 480 mL 1  . ondansetron (ZOFRAN-ODT) 4 MG disintegrating tablet Take 4 mg by mouth every 8 (eight) hours as needed for nausea or vomiting.     No current facility-administered medications for this visit.    Facility-Administered Medications Ordered in Other Visits  Medication Dose Route Frequency Provider Last Rate Last Dose  . sodium chloride flush (NS) 0.9 % injection 10 mL  10 mL Intracatheter PRN Lloyd Huger, MD   10 mL at 02/10/16 0940    PHYSICAL EXAMINATION: ECOG PERFORMANCE STATUS: 0 - Asymptomatic  BP (!) 159/97 (BP Location: Left Arm, Patient Position: Sitting)   Pulse 66   Temp 97.6 F (36.4 C)   Resp 18   Wt 199 lb 12.8 oz (90.6 kg)   SpO2 96%   BMI 27.10 kg/m   Filed Weights   03/02/16 0919  Weight: 199 lb 12.8 oz (90.6 kg)    GENERAL: Well-nourished well-developed; Alert, no distress and comfortable.  Accompanied by caregiver. EYES: no pallor or icterus OROPHARYNX: no thrush or ulceration; good dentition  NECK: supple, no masses felt LYMPH:  no palpable lymphadenopathy in the cervical, axillary or inguinal regions LUNGS: clear to auscultation and  No wheeze or crackles HEART/CVS: regular rate & rhythm and no murmurs; No lower extremity edema ABDOMEN:abdomen soft, non-tender and normal bowel sounds Musculoskeletal:no cyanosis of digits and no clubbing  PSYCH: alert & oriented x 3 with fluent speech NEURO: no focal motor/sensory deficits SKIN:  no rashes or significant lesions  LABORATORY DATA:  I have reviewed the data as listed    Component Value Date/Time   NA 138 03/02/2016 0845   NA 135 07/28/2014 1121   K 4.2 03/02/2016 0845   K 4.3 07/28/2014 1121   CL 105 03/02/2016 0845   CL 101 07/28/2014 1121   CO2 27 03/02/2016 0845   CO2 25 07/28/2014 1121   GLUCOSE 90 03/02/2016 0845   GLUCOSE 135 (H) 07/28/2014 1121   BUN 19 03/02/2016 0845   BUN 19 07/28/2014 1121   CREATININE 0.87  03/02/2016 0845   CREATININE 1.36 (H) 07/28/2014 1121   CALCIUM 9.0 03/02/2016 0845   CALCIUM 9.1 07/28/2014 1121   PROT 6.2 (L) 03/02/2016 0845   PROT 7.6 07/23/2014 1042   ALBUMIN 3.9 03/02/2016 0845   ALBUMIN 4.2 07/23/2014 1042   AST 20 03/02/2016 0845   AST 24 07/23/2014 1042   ALT 16 (L) 03/02/2016 0845   ALT 24 07/23/2014 1042   ALKPHOS 67 03/02/2016 0845   ALKPHOS 108 07/23/2014 1042   BILITOT 0.6 03/02/2016 0845   BILITOT 0.5 07/23/2014 1042   GFRNONAA >60 03/02/2016 0845   GFRNONAA 57 (L) 07/28/2014 1121   GFRAA >60 03/02/2016 0845  GFRAA >60 07/28/2014 1121    No results found for: SPEP, UPEP  Lab Results  Component Value Date   WBC 6.1 03/02/2016   NEUTROABS 4.0 03/02/2016   HGB 12.0 (L) 03/02/2016   HCT 34.8 (L) 03/02/2016   MCV 90.8 03/02/2016   PLT 243 03/02/2016      Chemistry      Component Value Date/Time   NA 138 03/02/2016 0845   NA 135 07/28/2014 1121   K 4.2 03/02/2016 0845   K 4.3 07/28/2014 1121   CL 105 03/02/2016 0845   CL 101 07/28/2014 1121   CO2 27 03/02/2016 0845   CO2 25 07/28/2014 1121   BUN 19 03/02/2016 0845   BUN 19 07/28/2014 1121   CREATININE 0.87 03/02/2016 0845   CREATININE 1.36 (H) 07/28/2014 1121      Component Value Date/Time   CALCIUM 9.0 03/02/2016 0845   CALCIUM 9.1 07/28/2014 1121   ALKPHOS 67 03/02/2016 0845   ALKPHOS 108 07/23/2014 1042   AST 20 03/02/2016 0845   AST 24 07/23/2014 1042   ALT 16 (L) 03/02/2016 0845   ALT 24 07/23/2014 1042   BILITOT 0.6 03/02/2016 0845   BILITOT 0.5 07/23/2014 1042     IMPRESSION: Radiation changes in the right upper lobe/ perihilar region.  No evidence of metastatic disease.  Mild diffuse esophageal wall thickening, favored to reflect radiation changes.   Electronically Signed   By: Julian Hy M.D.   On: 01/13/2016 11:02  RADIOGRAPHIC STUDIES: I have personally reviewed the radiological images as listed and agreed with the findings in the  report. No results found.   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 Currently on tecentriq. No concerns for clinical progression.     # proceed with treatment today. Patient tolerating treatment well. Labs unremarkable. Will get CT scan in end of Dec 2017.   # Dysphagia- ? Radiation changes; improved; gaining weight.   # Follow-up in 3 weeks' labs/treatment    No orders of the defined types were placed in this encounter.  All questions were answered. The patient knows to call the clinic with any problems, questions or concerns.      [name], MD 03/02/2016 4:03 PM       [name], MD 03/02/2016 4:03 PM

## 2016-03-23 ENCOUNTER — Inpatient Hospital Stay: Payer: Medicare Other

## 2016-03-23 ENCOUNTER — Inpatient Hospital Stay: Payer: Medicare Other | Attending: Internal Medicine | Admitting: Internal Medicine

## 2016-03-23 VITALS — BP 131/77 | HR 70 | Temp 97.6°F | Resp 18 | Wt 200.4 lb

## 2016-03-23 DIAGNOSIS — R5382 Chronic fatigue, unspecified: Secondary | ICD-10-CM | POA: Insufficient documentation

## 2016-03-23 DIAGNOSIS — Z5112 Encounter for antineoplastic immunotherapy: Secondary | ICD-10-CM | POA: Insufficient documentation

## 2016-03-23 DIAGNOSIS — Z923 Personal history of irradiation: Secondary | ICD-10-CM | POA: Diagnosis not present

## 2016-03-23 DIAGNOSIS — E039 Hypothyroidism, unspecified: Secondary | ICD-10-CM | POA: Insufficient documentation

## 2016-03-23 DIAGNOSIS — Z86010 Personal history of colonic polyps: Secondary | ICD-10-CM | POA: Diagnosis not present

## 2016-03-23 DIAGNOSIS — C3401 Malignant neoplasm of right main bronchus: Secondary | ICD-10-CM | POA: Diagnosis not present

## 2016-03-23 DIAGNOSIS — Y842 Radiological procedure and radiotherapy as the cause of abnormal reaction of the patient, or of later complication, without mention of misadventure at the time of the procedure: Secondary | ICD-10-CM | POA: Diagnosis not present

## 2016-03-23 DIAGNOSIS — Z87891 Personal history of nicotine dependence: Secondary | ICD-10-CM | POA: Diagnosis not present

## 2016-03-23 DIAGNOSIS — E119 Type 2 diabetes mellitus without complications: Secondary | ICD-10-CM | POA: Diagnosis not present

## 2016-03-23 DIAGNOSIS — C7802 Secondary malignant neoplasm of left lung: Secondary | ICD-10-CM | POA: Diagnosis not present

## 2016-03-23 DIAGNOSIS — Z79899 Other long term (current) drug therapy: Secondary | ICD-10-CM | POA: Diagnosis not present

## 2016-03-23 DIAGNOSIS — E785 Hyperlipidemia, unspecified: Secondary | ICD-10-CM | POA: Insufficient documentation

## 2016-03-23 DIAGNOSIS — I10 Essential (primary) hypertension: Secondary | ICD-10-CM | POA: Insufficient documentation

## 2016-03-23 DIAGNOSIS — C3491 Malignant neoplasm of unspecified part of right bronchus or lung: Secondary | ICD-10-CM

## 2016-03-23 DIAGNOSIS — F209 Schizophrenia, unspecified: Secondary | ICD-10-CM | POA: Diagnosis not present

## 2016-03-23 LAB — COMPREHENSIVE METABOLIC PANEL
ALK PHOS: 70 U/L (ref 38–126)
ALT: 11 U/L — AB (ref 17–63)
ANION GAP: 8 (ref 5–15)
AST: 19 U/L (ref 15–41)
Albumin: 3.7 g/dL (ref 3.5–5.0)
BILIRUBIN TOTAL: 0.5 mg/dL (ref 0.3–1.2)
BUN: 16 mg/dL (ref 6–20)
CALCIUM: 9 mg/dL (ref 8.9–10.3)
CO2: 24 mmol/L (ref 22–32)
CREATININE: 0.83 mg/dL (ref 0.61–1.24)
Chloride: 104 mmol/L (ref 101–111)
Glucose, Bld: 103 mg/dL — ABNORMAL HIGH (ref 65–99)
Potassium: 3.9 mmol/L (ref 3.5–5.1)
Sodium: 136 mmol/L (ref 135–145)
TOTAL PROTEIN: 6.3 g/dL — AB (ref 6.5–8.1)

## 2016-03-23 LAB — CBC WITH DIFFERENTIAL/PLATELET
Basophils Absolute: 0 10*3/uL (ref 0–0.1)
Basophils Relative: 0 %
EOS ABS: 0 10*3/uL (ref 0–0.7)
Eosinophils Relative: 0 %
HEMATOCRIT: 31.3 % — AB (ref 40.0–52.0)
HEMOGLOBIN: 10.9 g/dL — AB (ref 13.0–18.0)
LYMPHS ABS: 1.1 10*3/uL (ref 1.0–3.6)
LYMPHS PCT: 19 %
MCH: 31.4 pg (ref 26.0–34.0)
MCHC: 34.6 g/dL (ref 32.0–36.0)
MCV: 90.7 fL (ref 80.0–100.0)
MONOS PCT: 10 %
Monocytes Absolute: 0.6 10*3/uL (ref 0.2–1.0)
NEUTROS PCT: 71 %
Neutro Abs: 4.2 10*3/uL (ref 1.4–6.5)
Platelets: 295 10*3/uL (ref 150–440)
RBC: 3.46 MIL/uL — AB (ref 4.40–5.90)
RDW: 13 % (ref 11.5–14.5)
WBC: 5.9 10*3/uL (ref 3.8–10.6)

## 2016-03-23 MED ORDER — HEPARIN SOD (PORK) LOCK FLUSH 100 UNIT/ML IV SOLN
500.0000 [IU] | Freq: Once | INTRAVENOUS | Status: AC | PRN
  Administered 2016-03-23: 500 [IU]
  Filled 2016-03-23: qty 5

## 2016-03-23 MED ORDER — LORAZEPAM 2 MG/ML IJ SOLN
1.0000 mg | Freq: Once | INTRAMUSCULAR | Status: AC | PRN
  Administered 2016-03-23: 1 mg via INTRAVENOUS
  Filled 2016-03-23: qty 1

## 2016-03-23 MED ORDER — SODIUM CHLORIDE 0.9 % IV SOLN
1200.0000 mg | Freq: Once | INTRAVENOUS | Status: AC
  Administered 2016-03-23: 1200 mg via INTRAVENOUS
  Filled 2016-03-23: qty 20

## 2016-03-23 MED ORDER — ONDANSETRON 8 MG PO TBDP
8.0000 mg | ORAL_TABLET | Freq: Once | ORAL | Status: AC
  Administered 2016-03-23: 8 mg via ORAL
  Filled 2016-03-23: qty 1

## 2016-03-23 MED ORDER — SODIUM CHLORIDE 0.9 % IV SOLN
Freq: Once | INTRAVENOUS | Status: AC
  Administered 2016-03-23: 11:00:00 via INTRAVENOUS
  Filled 2016-03-23: qty 1000

## 2016-03-23 NOTE — Assessment & Plan Note (Signed)
#   SQUAMOUS Cell LUNG CA- STAGE IV- [ tumor nodule in the contralateral lung/ recurrence]; sep 27th CT- STABLE. Currently on tecentriq. No concerns for clinical progression.     # proceed with treatment today. Patient tolerating treatment well. Labs unremarkable.  # Follow-up in 4 weeks' labs/treatment [sec to holidays]; CT prior.

## 2016-03-23 NOTE — Progress Notes (Signed)
Patient is here for follow up, he is doing well

## 2016-03-23 NOTE — Progress Notes (Signed)
[place] OFFICE PROGRESS NOTE  Patient Care Team: [name] as PCP - General (Internal Medicine)   SUMMARY OF ONCOLOGIC HISTORY:    INTERVAL HISTORY: patient is a poor historian given schizophrenia/accompanied by caregiver.   [place] OFFICE PROGRESS NOTE  Patient Care Team: [name] as PCP - General (Internal Medicine)  Squamous cell carcinoma of right lung [facility])   Staging form: Lung, AJCC 7th Edition     Clinical stage from 07/17/2014: Stage IV (T4, N0, M1a) - Signed by Leia Alf, MD on 09/29/2014    Oncology History   # April 2016- SQUAMOUS CELL of LUNG ca [RUL] STAGE III; [EBUS-Right hilum];  (? Stage IV Left lung nodule- not Bx)   # FEB 2017-STAGE IV [contralateral lung nodules] PET Recurrence/Progression [high risk for [facility] 2017- START Tecentriq q 3W x3; April 2017 CT- LUL Stable/slight increase in size [~73m]; new lingula ~227m improved RUL radiation changes; Improved basilar nodules ? Atypical or infectious etilogy; JUne 12th CT-STABLE LUL.  # Schizophrenia/ group home resident     Squamous cell carcinoma of right lung (HCMid[place]  08/22/2014 Initial Diagnosis    Squamous cell carcinoma of right lung (HCTol[place]      Malignant neoplasm of hilus of right lung (HCHollandale  10/28/2015 Initial Diagnosis    Malignant neoplasm of hilus of right lung (HCHills[place]       INTERVAL HISTORY: Poor historian given his schizophrenia. Accompanied by caregiver.  [age]ear old male patient with above history ofMetastatic squamous cell lung cancer is here for follow-up/ currently on Tecentriq.    Appetite is good. No weight loss. No new shortness of breath or cough. No nausea no vomiting or diarrhea. No headaches. No skin rash.  REVIEW OF SYSTEMS:  Difficult to assess given his poor history giving skills.  PAST MEDICAL HISTORY :  Past Medical History:  Diagnosis Date  . Chronic fatigue   . Chronic fatigue   . Colon polyps   . Diabetes  mellitus without complication (HCTyrone  . Hyperlipemia   . Hypertension   . Hypothyroidism   . Schizophrenia (HCWinterville  . Squamous cell carcinoma of right lung (HCFort Campbell North5/09/2014    PAST SURGICAL HISTORY :   Past Surgical History:  Procedure Laterality Date  . COLONOSCOPY     history of colon polyps  . ENDOBRONCHIAL ULTRASOUND  07/30/2014   right hilum FNA/EBUS  . PERIPHERAL VASCULAR CATHETERIZATION N/A 08/21/2014   Procedure: PoGlori Luisath Insertion;  Surgeon: JaAlgernon HuxleyMD;  [facility];  Service: Cardiovascular;  Laterality: N/A;    FAMILY HISTORY :  No family history on file.  SOCIAL HISTORY:   Social History  Substance Use Topics  . Smoking status: Former Smoker    Packs/day: 0.25    Years: 42.00    Types: Cigarettes  . Smokeless tobacco: Never Used  . Alcohol use No    ALLERGIES:  has No Known Allergies.  MEDICATIONS:  Current Outpatient Prescriptions  Medication Sig Dispense Refill  . atorvastatin (LIPITOR) 10 MG tablet Take 10 mg by mouth every morning.     . calcium carbonate (TUMS - DOSED IN MG ELEMENTAL CALCIUM) 500 MG chewable tablet Chew 1 tablet by mouth 2 (two) times daily.    . ferrous sulfate 325 (65 FE) MG tablet Take 325 mg by mouth daily with breakfast.    . levothyroxine (SYNTHROID, LEVOTHROID) 88 MCG tablet Take 88 mcg by mouth every morning.     .Marland Kitchen  lisinopril (PRINIVIL,ZESTRIL) 10 MG tablet Take 10 mg by mouth daily.    Marland Kitchen OLANZapine (ZYPREXA) 20 MG tablet Take 30 mg by mouth at bedtime.     . ondansetron (ZOFRAN-ODT) 4 MG disintegrating tablet Take 4 mg by mouth every 8 (eight) hours as needed for nausea or vomiting.    . prochlorperazine (COMPAZINE) 10 MG tablet Take 1 tablet (10 mg total) by mouth every 6 (six) hours as needed for nausea or vomiting. 30 tablet 6  . propranolol (INDERAL) 10 MG tablet Take 10 mg by mouth 3 (three) times daily.    . sucralfate (CARAFATE) 1 g tablet Take 1 g by mouth 4 (four) times daily -  with meals and at  bedtime.    Marland Kitchen nystatin (MYCOSTATIN) 100000 UNIT/ML suspension Take 5 ml up to 4 times a day as needed for discomfort in throat. ( swish and swallow). (Patient not taking: Reported on 03/23/2016) 480 mL 1   No current facility-administered medications for this visit.    Facility-Administered Medications Ordered in Other Visits  Medication Dose Route Frequency Provider Last Rate Last Dose  . sodium chloride flush (NS) 0.9 % injection 10 mL  10 mL Intracatheter PRN Lloyd Huger, MD   10 mL at 02/10/16 0940    PHYSICAL EXAMINATION: ECOG PERFORMANCE STATUS: 0 - Asymptomatic  BP 131/77 (BP Location: Left Arm, Patient Position: Sitting)   Pulse 70   Temp 97.6 F (36.4 C) (Tympanic)   Resp 18   Wt 200 lb 6.4 oz (90.9 kg)   SpO2 95%   BMI 27.18 kg/m   Filed Weights   03/23/16 0956  Weight: 200 lb 6.4 oz (90.9 kg)    GENERAL: Well-nourished well-developed; Alert, no distress and comfortable.  Accompanied by caregiver. EYES: no pallor or icterus OROPHARYNX: no thrush or ulceration; good dentition  NECK: supple, no masses felt LYMPH:  no palpable lymphadenopathy in the cervical, axillary or inguinal regions LUNGS: clear to auscultation and  No wheeze or crackles HEART/CVS: regular rate & rhythm and no murmurs; No lower extremity edema ABDOMEN:abdomen soft, non-tender and normal bowel sounds Musculoskeletal:no cyanosis of digits and no clubbing  PSYCH: alert & oriented x 3 with fluent speech NEURO: no focal motor/sensory deficits SKIN:  no rashes or significant lesions  LABORATORY DATA:  I have reviewed the data as listed    Component Value Date/Time   NA 136 03/23/2016 0933   NA 135 07/28/2014 1121   K 3.9 03/23/2016 0933   K 4.3 07/28/2014 1121   CL 104 03/23/2016 0933   CL 101 07/28/2014 1121   CO2 24 03/23/2016 0933   CO2 25 07/28/2014 1121   GLUCOSE 103 (H) 03/23/2016 0933   GLUCOSE 135 (H) 07/28/2014 1121   BUN 16 03/23/2016 0933   BUN 19 07/28/2014 1121    CREATININE 0.83 03/23/2016 0933   CREATININE 1.36 (H) 07/28/2014 1121   CALCIUM 9.0 03/23/2016 0933   CALCIUM 9.1 07/28/2014 1121   PROT 6.3 (L) 03/23/2016 0933   PROT 7.6 07/23/2014 1042   ALBUMIN 3.7 03/23/2016 0933   ALBUMIN 4.2 07/23/2014 1042   AST 19 03/23/2016 0933   AST 24 07/23/2014 1042   ALT 11 (L) 03/23/2016 0933   ALT 24 07/23/2014 1042   ALKPHOS 70 03/23/2016 0933   ALKPHOS 108 07/23/2014 1042   BILITOT 0.5 03/23/2016 0933   BILITOT 0.5 07/23/2014 1042   GFRNONAA >60 03/23/2016 0933   GFRNONAA 57 (L) 07/28/2014 1121   GFRAA >60 03/23/2016  [place] >60 07/28/2014 1121    No results found for: SPEP, UPEP  Lab Results  Component Value Date   WBC 5.9 03/23/2016   NEUTROABS 4.2 03/23/2016   HGB 10.9 (L) 03/23/2016   HCT 31.3 (L) 03/23/2016   MCV 90.7 03/23/2016   PLT 295 03/23/2016      Chemistry      Component Value Date/Time   NA 136 03/23/2016 0933   NA 135 07/28/2014 1121   K 3.9 03/23/2016 0933   K 4.3 07/28/2014 1121   CL 104 03/23/2016 0933   CL 101 07/28/2014 1121   CO2 24 03/23/2016 0933   CO2 25 07/28/2014 1121   BUN 16 03/23/2016 0933   BUN 19 07/28/2014 1121   CREATININE 0.83 03/23/2016 0933   CREATININE 1.36 (H) 07/28/2014 1121      Component Value Date/Time   CALCIUM 9.0 03/23/2016 0933   CALCIUM 9.1 07/28/2014 1121   ALKPHOS 70 03/23/2016 0933   ALKPHOS 108 07/23/2014 1042   AST 19 03/23/2016 0933   AST 24 07/23/2014 1042   ALT 11 (L) 03/23/2016 0933   ALT 24 07/23/2014 1042   BILITOT 0.5 03/23/2016 0933   BILITOT 0.5 07/23/2014 1042     IMPRESSION: Radiation changes in the right upper lobe/ perihilar region.  No evidence of metastatic disease.  Mild diffuse esophageal wall thickening, favored to reflect radiation changes.   Electronically Signed   By: Julian Hy M.D.   On: 01/13/2016 11:02  RADIOGRAPHIC STUDIES: I have personally reviewed the radiological images as listed and agreed with the findings  in the report. No results found.   ASSESSMENT & PLAN:  Malignant neoplasm of hilus of right lung ([place]) # SQUAMOUS Cell LUNG CA- STAGE IV- [ tumor nodule in the contralateral lung/ recurrence]; sep 27th CT- STABLE. Currently on tecentriq. No concerns for clinical progression.     # proceed with treatment today. Patient tolerating treatment well. Labs unremarkable.  # Follow-up in 4 weeks' labs/treatment [sec to holidays]; CT prior.     Orders Placed This Encounter  Procedures  . CT CHEST W CONTRAST    Standing Status:   Future    Standing Expiration Date:   05/23/2017    Order Specific Question:   Reason for Exam (SYMPTOM  OR DIAGNOSIS REQUIRED)    Answer:   lung cancer    Order Specific Question:   Preferred imaging location?    Answer:   [facility]  . TSH    Standing Status:   Future    Standing Expiration Date:   09/21/2016   All questions were answered. The patient knows to call the clinic with any problems, questions or concerns.      [name], MD 03/24/2016 2:10 PM       [name], MD 03/24/2016 2:10 PM

## 2016-04-13 ENCOUNTER — Ambulatory Visit
Admission: RE | Admit: 2016-04-13 | Discharge: 2016-04-13 | Disposition: A | Discharge status: Home / Self Care | Payer: Medicare Other | Source: Ambulatory Visit | Attending: Internal Medicine | Admitting: Internal Medicine

## 2016-04-13 DIAGNOSIS — I700 Atherosclerosis of aorta: Secondary | ICD-10-CM | POA: Insufficient documentation

## 2016-04-13 DIAGNOSIS — C3491 Malignant neoplasm of unspecified part of right bronchus or lung: Secondary | ICD-10-CM | POA: Diagnosis not present

## 2016-04-13 DIAGNOSIS — I2510 Atherosclerotic heart disease of native coronary artery without angina pectoris: Secondary | ICD-10-CM | POA: Diagnosis not present

## 2016-04-13 DIAGNOSIS — J439 Emphysema, unspecified: Secondary | ICD-10-CM | POA: Insufficient documentation

## 2016-04-13 DIAGNOSIS — C3401 Malignant neoplasm of right main bronchus: Secondary | ICD-10-CM

## 2016-04-13 DIAGNOSIS — K449 Diaphragmatic hernia without obstruction or gangrene: Secondary | ICD-10-CM | POA: Insufficient documentation

## 2016-04-13 DIAGNOSIS — R918 Other nonspecific abnormal finding of lung field: Secondary | ICD-10-CM | POA: Diagnosis not present

## 2016-04-13 MED ORDER — IOPAMIDOL (ISOVUE-300) INJECTION 61%
75.0000 mL | Freq: Once | INTRAVENOUS | Status: AC | PRN
  Administered 2016-04-13: 75 mL via INTRAVENOUS

## 2016-04-20 ENCOUNTER — Inpatient Hospital Stay: Payer: Medicare Other

## 2016-04-20 ENCOUNTER — Inpatient Hospital Stay: Payer: Medicare Other | Attending: Internal Medicine | Admitting: Internal Medicine

## 2016-04-20 VITALS — BP 111/74 | HR 91 | Temp 97.2°F | Wt 196.2 lb

## 2016-04-20 DIAGNOSIS — Z86010 Personal history of colonic polyps: Secondary | ICD-10-CM | POA: Diagnosis not present

## 2016-04-20 DIAGNOSIS — E785 Hyperlipidemia, unspecified: Secondary | ICD-10-CM | POA: Diagnosis not present

## 2016-04-20 DIAGNOSIS — Z5112 Encounter for antineoplastic immunotherapy: Secondary | ICD-10-CM | POA: Insufficient documentation

## 2016-04-20 DIAGNOSIS — C3401 Malignant neoplasm of right main bronchus: Secondary | ICD-10-CM | POA: Insufficient documentation

## 2016-04-20 DIAGNOSIS — K229 Disease of esophagus, unspecified: Secondary | ICD-10-CM | POA: Diagnosis not present

## 2016-04-20 DIAGNOSIS — E039 Hypothyroidism, unspecified: Secondary | ICD-10-CM | POA: Insufficient documentation

## 2016-04-20 DIAGNOSIS — J439 Emphysema, unspecified: Secondary | ICD-10-CM | POA: Diagnosis not present

## 2016-04-20 DIAGNOSIS — C7802 Secondary malignant neoplasm of left lung: Secondary | ICD-10-CM | POA: Diagnosis not present

## 2016-04-20 DIAGNOSIS — Z87891 Personal history of nicotine dependence: Secondary | ICD-10-CM | POA: Insufficient documentation

## 2016-04-20 DIAGNOSIS — C3491 Malignant neoplasm of unspecified part of right bronchus or lung: Secondary | ICD-10-CM

## 2016-04-20 DIAGNOSIS — I700 Atherosclerosis of aorta: Secondary | ICD-10-CM | POA: Insufficient documentation

## 2016-04-20 DIAGNOSIS — I10 Essential (primary) hypertension: Secondary | ICD-10-CM | POA: Insufficient documentation

## 2016-04-20 DIAGNOSIS — Z923 Personal history of irradiation: Secondary | ICD-10-CM | POA: Diagnosis not present

## 2016-04-20 DIAGNOSIS — F209 Schizophrenia, unspecified: Secondary | ICD-10-CM | POA: Insufficient documentation

## 2016-04-20 DIAGNOSIS — Z79899 Other long term (current) drug therapy: Secondary | ICD-10-CM | POA: Insufficient documentation

## 2016-04-20 DIAGNOSIS — D649 Anemia, unspecified: Secondary | ICD-10-CM | POA: Diagnosis not present

## 2016-04-20 DIAGNOSIS — Y842 Radiological procedure and radiotherapy as the cause of abnormal reaction of the patient, or of later complication, without mention of misadventure at the time of the procedure: Secondary | ICD-10-CM | POA: Insufficient documentation

## 2016-04-20 DIAGNOSIS — R110 Nausea: Secondary | ICD-10-CM

## 2016-04-20 DIAGNOSIS — E119 Type 2 diabetes mellitus without complications: Secondary | ICD-10-CM | POA: Diagnosis not present

## 2016-04-20 DIAGNOSIS — I2510 Atherosclerotic heart disease of native coronary artery without angina pectoris: Secondary | ICD-10-CM | POA: Insufficient documentation

## 2016-04-20 LAB — TSH: TSH: 1.816 u[IU]/mL (ref 0.350–4.500)

## 2016-04-20 LAB — CBC WITH DIFFERENTIAL/PLATELET
BASOS PCT: 0 %
Basophils Absolute: 0 10*3/uL (ref 0–0.1)
EOS ABS: 0 10*3/uL (ref 0–0.7)
EOS PCT: 0 %
HCT: 34.6 % — ABNORMAL LOW (ref 40.0–52.0)
Hemoglobin: 11.7 g/dL — ABNORMAL LOW (ref 13.0–18.0)
Lymphocytes Relative: 19 %
Lymphs Abs: 1.3 10*3/uL (ref 1.0–3.6)
MCH: 30 pg (ref 26.0–34.0)
MCHC: 33.7 g/dL (ref 32.0–36.0)
MCV: 89.1 fL (ref 80.0–100.0)
MONO ABS: 0.5 10*3/uL (ref 0.2–1.0)
MONOS PCT: 7 %
NEUTROS PCT: 74 %
Neutro Abs: 5.1 10*3/uL (ref 1.4–6.5)
PLATELETS: 323 10*3/uL (ref 150–440)
RBC: 3.89 MIL/uL — ABNORMAL LOW (ref 4.40–5.90)
RDW: 12.8 % (ref 11.5–14.5)
WBC: 6.8 10*3/uL (ref 3.8–10.6)

## 2016-04-20 LAB — COMPREHENSIVE METABOLIC PANEL
ALBUMIN: 3.8 g/dL (ref 3.5–5.0)
ALT: 10 U/L — ABNORMAL LOW (ref 17–63)
ANION GAP: 6 (ref 5–15)
AST: 18 U/L (ref 15–41)
Alkaline Phosphatase: 78 U/L (ref 38–126)
BUN: 15 mg/dL (ref 6–20)
CO2: 27 mmol/L (ref 22–32)
Calcium: 9.2 mg/dL (ref 8.9–10.3)
Chloride: 103 mmol/L (ref 101–111)
Creatinine, Ser: 0.93 mg/dL (ref 0.61–1.24)
GFR calc Af Amer: >60 mL/min (ref >60)
GFR calc non Af Amer: >60 mL/min (ref >60)
GLUCOSE: 137 mg/dL — AB (ref 65–99)
Potassium: 3.9 mmol/L (ref 3.5–5.1)
SODIUM: 136 mmol/L (ref 135–145)
Total Bilirubin: 0.5 mg/dL (ref 0.3–1.2)
Total Protein: 6.7 g/dL (ref 6.5–8.1)

## 2016-04-20 MED ORDER — HEPARIN SOD (PORK) LOCK FLUSH 100 UNIT/ML IV SOLN
INTRAVENOUS | Status: AC
  Filled 2016-04-20: qty 5

## 2016-04-20 MED ORDER — HEPARIN SOD (PORK) LOCK FLUSH 100 UNIT/ML IV SOLN
500.0000 [IU] | Freq: Once | INTRAVENOUS | Status: AC
  Administered 2016-04-20: 500 [IU] via INTRAVENOUS

## 2016-04-20 MED ORDER — LEVOFLOXACIN 500 MG PO TABS: 500.0000 mg | ORAL_TABLET | Freq: Every day | ORAL | 0 refills | Status: DC

## 2016-04-20 NOTE — Progress Notes (Signed)
[place] OFFICE PROGRESS NOTE  Patient Care Team: [name] as PCP - General (Internal Medicine)   SUMMARY OF ONCOLOGIC HISTORY:    INTERVAL HISTORY: patient is a poor historian given schizophrenia/accompanied by caregiver.   [place] OFFICE PROGRESS NOTE  Patient Care Team: [name] as PCP - General (Internal Medicine)  Squamous cell carcinoma of right lung [facility])   Staging form: Lung, AJCC 7th Edition     Clinical stage from 07/17/2014: Stage IV (T4, N0, M1a) - Signed by Leia Alf, MD on 09/29/2014    Oncology History   # April 2016- SQUAMOUS CELL of LUNG ca [RUL] STAGE III; [EBUS-Right hilum];  (? Stage IV Left lung nodule- not Bx)   # FEB 2017-STAGE IV [contralateral lung nodules] PET Recurrence/Progression [high risk for [facility] 2017- START Tecentriq q 3W x3; April 2017 CT- LUL Stable/slight increase in size [~16m]; new lingula ~225m improved RUL radiation changes; Improved basilar nodules ? Atypical or infectious etilogy; JUne 12th CT-STABLE LUL.  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 Accompanied by caregiver.  89ear old male patient with above history of Metastatic squamous cell lung cancer is here for follow-up/ currently on Tecentriq.    Appetite is good. Some nausea without vomiting. Caregiver states she thinks it's just acid reflux. No weight loss. No new shortness of breath or cough. No headaches. No skin rash.   REVIEW OF SYSTEMS:  Difficult to assess given his poor history giving skills.  PAST MEDICAL HISTORY :  Past Medical History:  Diagnosis Date  . Chronic fatigue   .  Chronic fatigue   . Colon polyps   . Diabetes mellitus without complication (HCAsotin  . Hyperlipemia   . Hypertension   . Hypothyroidism   . Schizophrenia (HCM[place]  . Squamous cell carcinoma of right lung (HCGold[place]5/09/2014    PAST SURGICAL HISTORY :   Past Surgical History:  Procedure Laterality Date  . COLONOSCOPY     history of colon polyps  . ENDOBRONCHIAL ULTRASOUND  07/30/2014   right hilum FNA/EBUS  . PERIPHERAL VASCULAR CATHETERIZATION N/A 08/21/2014   Procedure: PoGlori Luisath Insertion;  Surgeon: JaAlgernon HuxleyMD;  [facility];  Service: Cardiovascular;  Laterality: N/A;    FAMILY HISTORY :  No family history on file.  SOCIAL HISTORY:   Social History  Substance Use Topics  . Smoking status: Former Smoker    Packs/day: 0.25    Years: 42.00    Types: Cigarettes  . Smokeless tobacco: Never Used  . Alcohol use No    ALLERGIES:  has No Known Allergies.  MEDICATIONS:  Current Outpatient Prescriptions  Medication Sig Dispense Refill  . atorvastatin (LIPITOR) 10 MG tablet Take 10 mg by mouth every morning.     . calcium carbonate (TUMS - DOSED IN MG ELEMENTAL CALCIUM) 500 MG chewable tablet Chew 1 tablet by mouth 2 (two) times daily.    . ferrous sulfate 325 (65 FE) MG tablet Take 325 mg by mouth daily with breakfast.    . levofloxacin (LEVAQUIN) 500 MG tablet Take 1  tablet (500 mg total) by mouth daily. 7 tablet 0  . levothyroxine (SYNTHROID, LEVOTHROID) 88 MCG tablet Take 88 mcg by mouth every morning.     Marland Kitchen lisinopril (PRINIVIL,ZESTRIL) 10 MG tablet Take 10 mg by mouth daily.    Marland Kitchen nystatin (MYCOSTATIN) 100000 UNIT/ML suspension Take 5 ml up to 4 times a day as needed for discomfort in throat. ( swish and swallow). (Patient not taking: Reported on 03/23/2016) 480 mL 1  . OLANZapine (ZYPREXA) 20 MG tablet Take 30 mg by mouth at bedtime.     . ondansetron (ZOFRAN-ODT) 4 MG disintegrating tablet Take 4 mg by mouth every 8 (eight) hours as needed for nausea or  vomiting.    . prochlorperazine (COMPAZINE) 10 MG tablet Take 1 tablet (10 mg total) by mouth every 6 (six) hours as needed for nausea or vomiting. 30 tablet 6  . propranolol (INDERAL) 10 MG tablet Take 10 mg by mouth 3 (three) times daily.    . sucralfate (CARAFATE) 1 g tablet Take 1 g by mouth 4 (four) times daily -  with meals and at bedtime.     No current facility-administered medications for this visit.    Facility-Administered Medications Ordered in Other Visits  Medication Dose Route Frequency Provider Last Rate Last Dose  . sodium chloride flush (NS) 0.9 % injection 10 mL  10 mL Intracatheter PRN Lloyd Huger, MD   10 mL at 02/10/16 0940    PHYSICAL EXAMINATION: ECOG PERFORMANCE STATUS: 0 - Asymptomatic  BP 111/74 (BP Location: Left Arm, Patient Position: Sitting)   Pulse 91   Temp 97.2 F (36.2 C) (Tympanic)   Wt 196 lb 3.2 oz (89 kg)   BMI 26.61 kg/m   Filed Weights   04/20/16 1054  Weight: 196 lb 3.2 oz (89 kg)    GENERAL: Well-nourished well-developed; Alert, no distress and comfortable.  Accompanied by caregiver. EYES: no pallor or icterus OROPHARYNX: no thrush or ulceration; good dentition  NECK: supple, no masses felt LYMPH:  no palpable lymphadenopathy in the cervical, axillary or inguinal regions LUNGS: clear to auscultation and  No wheeze or crackles HEART/CVS: regular rate & rhythm and no murmurs; No lower extremity edema ABDOMEN:abdomen soft, non-tender and normal bowel sounds Musculoskeletal:no cyanosis of digits and no clubbing  PSYCH: alert & oriented NEURO: no focal motor/sensory deficits SKIN:  no rashes or significant lesions  LABORATORY DATA:  I have reviewed the data as listed    Component Value Date/Time   NA 136 04/20/2016 0932   NA 135 07/28/2014 1121   K 3.9 04/20/2016 0932   K 4.3 07/28/2014 1121   CL 103 04/20/2016 0932   CL 101 07/28/2014 1121   CO2 27 04/20/2016 0932   CO2 25 07/28/2014 1121   GLUCOSE 137 (H) 04/20/2016  0932   GLUCOSE 135 (H) 07/28/2014 1121   BUN 15 04/20/2016 0932   BUN 19 07/28/2014 1121   CREATININE 0.93 04/20/2016 0932   CREATININE 1.36 (H) 07/28/2014 1121   CALCIUM 9.2 04/20/2016 0932   CALCIUM 9.1 07/28/2014 1121   PROT 6.7 04/20/2016 0932   PROT 7.6 07/23/2014 1042   ALBUMIN 3.8 04/20/2016 0932   ALBUMIN 4.2 07/23/2014 1042   AST 18 04/20/2016 0932   AST 24 07/23/2014 1042   ALT 10 (L) 04/20/2016 0932   ALT 24 07/23/2014 1042   ALKPHOS 78 04/20/2016 0932   ALKPHOS 108 07/23/2014 1042   BILITOT 0.5 04/20/2016 0932   BILITOT 0.5 07/23/2014 1042  GFRNONAA >60 04/20/2016 0932   GFRNONAA 57 (L) 07/28/2014 1121   GFRAA >60 04/20/2016 0932   GFRAA >60 07/28/2014 1121    No results found for: SPEP, UPEP  Lab Results  Component Value Date   WBC 6.8 04/20/2016   NEUTROABS 5.1 04/20/2016   HGB 11.7 (L) 04/20/2016   HCT 34.6 (L) 04/20/2016   MCV 89.1 04/20/2016   PLT 323 04/20/2016      Chemistry      Component Value Date/Time   NA 136 04/20/2016 0932   NA 135 07/28/2014 1121   K 3.9 04/20/2016 0932   K 4.3 07/28/2014 1121   CL 103 04/20/2016 0932   CL 101 07/28/2014 1121   CO2 27 04/20/2016 0932   CO2 25 07/28/2014 1121   BUN 15 04/20/2016 0932   BUN 19 07/28/2014 1121   CREATININE 0.93 04/20/2016 0932   CREATININE 1.36 (H) 07/28/2014 1121      Component Value Date/Time   CALCIUM 9.2 04/20/2016 0932   CALCIUM 9.1 07/28/2014 1121   ALKPHOS 78 04/20/2016 0932   ALKPHOS 108 07/23/2014 1042   AST 18 04/20/2016 0932   AST 24 07/23/2014 1042   ALT 10 (L) 04/20/2016 0932   ALT 24 07/23/2014 1042   BILITOT 0.5 04/20/2016 0932   BILITOT 0.5 07/23/2014 1042     IMPRESSION: Radiation changes in the right upper lobe/ perihilar region.  No evidence of metastatic disease.  Mild diffuse esophageal wall thickening, favored to reflect radiation changes.   Electronically Signed   By: Julian Hy M.D.   On: 01/13/2016 11:02  RADIOGRAPHIC  STUDIES: I have personally reviewed the radiological images as listed and agreed with the findings in the report. No results found.   ASSESSMENT & PLAN:    SQUAMOUS Cell LUNG CA- STAGE IV- [ tumor nodule in the contralateral lung/ recurrence]. Currently on tecentriq.   # Hold treatment today. CT scan can not determine if their are new enlarging nodules in LUL v. Infection or inflammation in etiology. Given the rapid development of nodules;infection/inflammation is presumed. Will hold treatment today. Prescription for Levaquin given 500 mg q 7 days. Will get Chest X-ray prior to next appointment in three weeks.   # Follow-up in 3 weeks/labs/tecentriq- Chest X-ray prior.  No problem-specific Assessment & Plan notes found for this encounter.   Orders Placed This Encounter  Procedures  . DG Chest 2 View    Standing Status:   Future    Standing Expiration Date:   04/20/2017    Order Specific Question:   Reason for Exam (SYMPTOM  OR DIAGNOSIS REQUIRED)    Answer:   Rule out Pneumonia    Order Specific Question:   Preferred imaging location?    Answer:   [facility]   All questions were answered. The patient knows to call the clinic with any problems, questions or concerns.     Faythe Casa, NP   Jacquelin Hawking, NP 04/20/2016 1:32 PM

## 2016-04-20 NOTE — Progress Notes (Signed)
Patient here for follow up. No changes since last appointment.  

## 2016-05-03 ENCOUNTER — Ambulatory Visit
Admission: RE | Admit: 2016-05-03 | Discharge: 2016-05-03 | Disposition: A | Discharge status: Home / Self Care | Payer: Medicare Other | Source: Ambulatory Visit | Attending: Internal Medicine | Admitting: Internal Medicine

## 2016-05-03 ENCOUNTER — Ambulatory Visit
Admission: RE | Admit: 2016-05-03 | Discharge: 2016-05-03 | Disposition: A | Discharge status: Home / Self Care | Payer: Medicare Other | Source: Ambulatory Visit | Attending: Oncology | Admitting: Oncology

## 2016-05-03 DIAGNOSIS — J449 Chronic obstructive pulmonary disease, unspecified: Secondary | ICD-10-CM | POA: Insufficient documentation

## 2016-05-03 DIAGNOSIS — J984 Other disorders of lung: Secondary | ICD-10-CM | POA: Insufficient documentation

## 2016-05-03 DIAGNOSIS — R918 Other nonspecific abnormal finding of lung field: Secondary | ICD-10-CM | POA: Insufficient documentation

## 2016-05-03 DIAGNOSIS — C3491 Malignant neoplasm of unspecified part of right bronchus or lung: Secondary | ICD-10-CM | POA: Insufficient documentation

## 2016-05-11 ENCOUNTER — Inpatient Hospital Stay: Payer: Medicare Other

## 2016-05-11 ENCOUNTER — Inpatient Hospital Stay (HOSPITAL_BASED_OUTPATIENT_CLINIC_OR_DEPARTMENT_OTHER): Payer: Medicare Other | Admitting: Internal Medicine

## 2016-05-11 VITALS — BP 115/79 | HR 66 | Temp 96.3°F | Wt 190.5 lb

## 2016-05-11 DIAGNOSIS — E785 Hyperlipidemia, unspecified: Secondary | ICD-10-CM

## 2016-05-11 DIAGNOSIS — Z79899 Other long term (current) drug therapy: Secondary | ICD-10-CM

## 2016-05-11 DIAGNOSIS — C3401 Malignant neoplasm of right main bronchus: Secondary | ICD-10-CM | POA: Diagnosis not present

## 2016-05-11 DIAGNOSIS — Z923 Personal history of irradiation: Secondary | ICD-10-CM | POA: Diagnosis not present

## 2016-05-11 DIAGNOSIS — J439 Emphysema, unspecified: Secondary | ICD-10-CM

## 2016-05-11 DIAGNOSIS — D649 Anemia, unspecified: Secondary | ICD-10-CM

## 2016-05-11 DIAGNOSIS — C3491 Malignant neoplasm of unspecified part of right bronchus or lung: Secondary | ICD-10-CM

## 2016-05-11 DIAGNOSIS — Y842 Radiological procedure and radiotherapy as the cause of abnormal reaction of the patient, or of later complication, without mention of misadventure at the time of the procedure: Secondary | ICD-10-CM | POA: Diagnosis not present

## 2016-05-11 DIAGNOSIS — C7802 Secondary malignant neoplasm of left lung: Secondary | ICD-10-CM

## 2016-05-11 DIAGNOSIS — I10 Essential (primary) hypertension: Secondary | ICD-10-CM

## 2016-05-11 DIAGNOSIS — K229 Disease of esophagus, unspecified: Secondary | ICD-10-CM | POA: Diagnosis not present

## 2016-05-11 DIAGNOSIS — E039 Hypothyroidism, unspecified: Secondary | ICD-10-CM

## 2016-05-11 DIAGNOSIS — E119 Type 2 diabetes mellitus without complications: Secondary | ICD-10-CM

## 2016-05-11 DIAGNOSIS — F209 Schizophrenia, unspecified: Secondary | ICD-10-CM

## 2016-05-11 DIAGNOSIS — I700 Atherosclerosis of aorta: Secondary | ICD-10-CM | POA: Diagnosis not present

## 2016-05-11 DIAGNOSIS — Z87891 Personal history of nicotine dependence: Secondary | ICD-10-CM

## 2016-05-11 DIAGNOSIS — I2510 Atherosclerotic heart disease of native coronary artery without angina pectoris: Secondary | ICD-10-CM

## 2016-05-11 DIAGNOSIS — Z86010 Personal history of colonic polyps: Secondary | ICD-10-CM

## 2016-05-11 DIAGNOSIS — Z5112 Encounter for antineoplastic immunotherapy: Secondary | ICD-10-CM | POA: Diagnosis not present

## 2016-05-11 LAB — CBC WITH DIFFERENTIAL/PLATELET
BASOS ABS: 0 10*3/uL (ref 0–0.1)
Basophils Relative: 0 %
Eosinophils Absolute: 0 10*3/uL (ref 0–0.7)
Eosinophils Relative: 0 %
HEMATOCRIT: 35.1 % — AB (ref 40.0–52.0)
HEMOGLOBIN: 11.9 g/dL — AB (ref 13.0–18.0)
LYMPHS ABS: 1.2 10*3/uL (ref 1.0–3.6)
LYMPHS PCT: 19 %
MCH: 29.9 pg (ref 26.0–34.0)
MCHC: 34 g/dL (ref 32.0–36.0)
MCV: 87.9 fL (ref 80.0–100.0)
Monocytes Absolute: 0.5 10*3/uL (ref 0.2–1.0)
Monocytes Relative: 9 %
NEUTROS ABS: 4.5 10*3/uL (ref 1.4–6.5)
NEUTROS PCT: 72 %
Platelets: 275 10*3/uL (ref 150–440)
RBC: 4 MIL/uL — AB (ref 4.40–5.90)
RDW: 13.2 % (ref 11.5–14.5)
WBC: 6.2 10*3/uL (ref 3.8–10.6)

## 2016-05-11 LAB — COMPREHENSIVE METABOLIC PANEL
ALK PHOS: 76 U/L (ref 38–126)
ALT: 12 U/L — AB (ref 17–63)
AST: 21 U/L (ref 15–41)
Albumin: 4.1 g/dL (ref 3.5–5.0)
Anion gap: 5 (ref 5–15)
BILIRUBIN TOTAL: 0.5 mg/dL (ref 0.3–1.2)
BUN: 15 mg/dL (ref 6–20)
CALCIUM: 9.3 mg/dL (ref 8.9–10.3)
CHLORIDE: 102 mmol/L (ref 101–111)
CO2: 27 mmol/L (ref 22–32)
CREATININE: 0.91 mg/dL (ref 0.61–1.24)
GFR calc Af Amer: >60 mL/min (ref >60)
Glucose, Bld: 98 mg/dL (ref 65–99)
Potassium: 4.5 mmol/L (ref 3.5–5.1)
Sodium: 134 mmol/L — ABNORMAL LOW (ref 135–145)
Total Protein: 6.9 g/dL (ref 6.5–8.1)

## 2016-05-11 LAB — TSH: TSH: 0.961 u[IU]/mL (ref 0.350–4.500)

## 2016-05-11 MED ORDER — SODIUM CHLORIDE 0.9% FLUSH
10.0000 mL | INTRAVENOUS | Status: DC | PRN
  Administered 2016-05-11: 10 mL
  Filled 2016-05-11: qty 10

## 2016-05-11 MED ORDER — SODIUM CHLORIDE 0.9 % IV SOLN
Freq: Once | INTRAVENOUS | Status: AC
  Administered 2016-05-11: 09:00:00 via INTRAVENOUS
  Filled 2016-05-11: qty 1000

## 2016-05-11 MED ORDER — SODIUM CHLORIDE 0.9 % IV SOLN
1200.0000 mg | Freq: Once | INTRAVENOUS | Status: AC
  Administered 2016-05-11: 1200 mg via INTRAVENOUS
  Filled 2016-05-11: qty 20

## 2016-05-11 MED ORDER — HEPARIN SOD (PORK) LOCK FLUSH 100 UNIT/ML IV SOLN
500.0000 [IU] | Freq: Once | INTRAVENOUS | Status: AC | PRN
  Administered 2016-05-11: 500 [IU]
  Filled 2016-05-11: qty 5

## 2016-05-11 NOTE — Progress Notes (Signed)
[place] OFFICE PROGRESS NOTE  Patient Care Team: [name] as PCP - General (Internal Medicine)  [place] OFFICE PROGRESS NOTE  Patient Care Team: [name] as PCP - General (Internal Medicine)  Squamous cell carcinoma of right lung [facility])   Staging form: Lung, AJCC 7th Edition     Clinical stage from 07/17/2014: Stage IV (T4, N0, M1a) - Signed by Leia Alf, MD on 09/29/2014    Oncology History   # April 2016- SQUAMOUS CELL of LUNG ca [RUL] STAGE III; [EBUS-Right hilum];  (? Stage IV Left lung nodule- not Bx)   # FEB 2017-STAGE IV [contralateral lung nodules] PET Recurrence/Progression [high risk for [facility] 2017- START Tecentriq q 3W x3; April 2017 CT- LUL Stable/slight increase in size [~2m]; new lingula ~247m improved RUL radiation changes; Improved basilar nodules ? Atypical or infectious etilogy; JUne 12th CT-STABLE LUL.  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 Accompanied by caregiver.  [age]ear old male patient with above history ofMetastatic squamous cell lung cancer is here for follow-up/ currently on Tecentriq.    Patient's treatment was held 3 weeks ago because of cough; Possible infiltrates noted on the CT scan. His status post Levaquin and prednisone. Symptoms resolved.  Appetite is good. No weight loss. No new shortness of breath or cough. No nausea no vomiting or diarrhea. No headaches. No skin rash.  REVIEW OF SYSTEMS:  Difficult to assess given his poor history giving skills.  PAST MEDICAL HISTORY :  Past Medical History:  Diagnosis Date  . Chronic fatigue   . Chronic fatigue    . Colon polyps   . Diabetes mellitus without complication (HCLocust Valley  . Hyperlipemia   . Hypertension   . Hypothyroidism   . Schizophrenia (HCPasad[place]  . Squamous cell carcinoma of right lung (HCRiple[place]5/09/2014    PAST SURGICAL HISTORY :   Past Surgical History:  Procedure Laterality Date  . COLONOSCOPY     history of colon polyps  . ENDOBRONCHIAL ULTRASOUND  07/30/2014   right hilum FNA/EBUS  . PERIPHERAL VASCULAR CATHETERIZATION N/A 08/21/2014   Procedure: PoGlori Luisath Insertion;  Surgeon: JaAlgernon HuxleyMD;  Location[facility];  Service: Cardiovascular;  Laterality: N/A;    FAMILY HISTORY :  No family history on file.  SOCIAL HISTORY:   Social History  Substance Use Topics  . Smoking status: Former Smoker    Packs/day: 0.25    Years: 42.00    Types: Cigarettes  . Smokeless tobacco: Never Used  . Alcohol use No    ALLERGIES:  has No Known Allergies.  MEDICATIONS:  Current Outpatient Prescriptions  Medication Sig Dispense Refill  . atorvastatin (LIPITOR) 10 MG tablet Take 10 mg by mouth every morning.     . calcium carbonate (TUMS - DOSED IN MG ELEMENTAL CALCIUM) 500 MG chewable tablet Chew 1 tablet by mouth 2 (two) times daily.    . ferrous sulfate 325 (65 FE) MG tablet Take 325 mg by mouth daily with breakfast.    . levothyroxine (SYNTHROID, LEVOTHROID) 88 MCG tablet Take 88 mcg by  mouth every morning.     Marland Kitchen lisinopril (PRINIVIL,ZESTRIL) 10 MG tablet Take 10 mg by mouth daily.    Marland Kitchen nystatin (MYCOSTATIN) 100000 UNIT/ML suspension Take 5 ml up to 4 times a day as needed for discomfort in throat. ( swish and swallow). 480 mL 1  . OLANZapine (ZYPREXA) 20 MG tablet Take 30 mg by mouth at bedtime.     . ondansetron (ZOFRAN-ODT) 4 MG disintegrating tablet Take 4 mg by mouth every 8 (eight) hours as needed for nausea or vomiting.    . prochlorperazine (COMPAZINE) 10 MG tablet Take 1 tablet (10 mg total) by mouth every 6 (six) hours as needed for nausea or vomiting. 30 tablet 6  .  propranolol (INDERAL) 10 MG tablet Take 10 mg by mouth 3 (three) times daily.    . sucralfate (CARAFATE) 1 g tablet Take 1 g by mouth 4 (four) times daily -  with meals and at bedtime.     No current facility-administered medications for this visit.    Facility-Administered Medications Ordered in Other Visits  Medication Dose Route Frequency Provider Last Rate Last Dose  . sodium chloride flush (NS) 0.9 % injection 10 mL  10 mL Intracatheter PRN Lloyd Huger, MD   10 mL at 02/10/16 0940    PHYSICAL EXAMINATION: ECOG PERFORMANCE STATUS: 0 - Asymptomatic  BP 115/79 (BP Location: Right Arm, Patient Position: Sitting)   Pulse 66   Temp (!) 96.3 F (35.7 C) (Tympanic)   Wt 190 lb 8 oz (86.4 kg)   BMI 25.84 kg/m   Filed Weights   05/11/16 0849  Weight: 190 lb 8 oz (86.4 kg)    GENERAL: Well-nourished well-developed; Alert, no distress and comfortable.  Accompanied by caregiver. EYES: no pallor or icterus OROPHARYNX: no thrush or ulceration; good dentition  NECK: supple, no masses felt LYMPH:  no palpable lymphadenopathy in the cervical, axillary or inguinal regions LUNGS: clear to auscultation and  No wheeze or crackles HEART/CVS: regular rate & rhythm and no murmurs; No lower extremity edema ABDOMEN:abdomen soft, non-tender and normal bowel sounds Musculoskeletal:no cyanosis of digits and no clubbing  PSYCH: alert & oriented x 3 with fluent speech NEURO: no focal motor/sensory deficits SKIN:  no rashes or significant lesions  LABORATORY DATA:  I have reviewed the data as listed    Component Value Date/Time   NA 134 (L) 05/11/2016 0827   NA 135 07/28/2014 1121   K 4.5 05/11/2016 0827   K 4.3 07/28/2014 1121   CL 102 05/11/2016 0827   CL 101 07/28/2014 1121   CO2 27 05/11/2016 0827   CO2 25 07/28/2014 1121   GLUCOSE 98 05/11/2016 0827   GLUCOSE 135 (H) 07/28/2014 1121   BUN 15 05/11/2016 0827   BUN 19 07/28/2014 1121   CREATININE 0.91 05/11/2016 0827    CREATININE 1.36 (H) 07/28/2014 1121   CALCIUM 9.3 05/11/2016 0827   CALCIUM 9.1 07/28/2014 1121   PROT 6.9 05/11/2016 0827   PROT 7.6 07/23/2014 1042   ALBUMIN 4.1 05/11/2016 0827   ALBUMIN 4.2 07/23/2014 1042   AST 21 05/11/2016 0827   AST 24 07/23/2014 1042   ALT 12 (L) 05/11/2016 0827   ALT 24 07/23/2014 1042   ALKPHOS 76 05/11/2016 0827   ALKPHOS 108 07/23/2014 1042   BILITOT 0.5 05/11/2016 0827   BILITOT 0.5 07/23/2014 1042   GFRNONAA >60 05/11/2016 0827   GFRNONAA 57 (L) 07/28/2014 1121   GFRAA >60 05/11/2016 0827   GFRAA >60 07/28/2014  1121    No results found for: SPEP, UPEP  Lab Results  Component Value Date   WBC 6.2 05/11/2016   NEUTROABS 4.5 05/11/2016   HGB 11.9 (L) 05/11/2016   HCT 35.1 (L) 05/11/2016   MCV 87.9 05/11/2016   PLT 275 05/11/2016      Chemistry      Component Value Date/Time   NA 134 (L) 05/11/2016 0827   NA 135 07/28/2014 1121   K 4.5 05/11/2016 0827   K 4.3 07/28/2014 1121   CL 102 05/11/2016 0827   CL 101 07/28/2014 1121   CO2 27 05/11/2016 0827   CO2 25 07/28/2014 1121   BUN 15 05/11/2016 0827   BUN 19 07/28/2014 1121   CREATININE 0.91 05/11/2016 0827   CREATININE 1.36 (H) 07/28/2014 1121      Component Value Date/Time   CALCIUM 9.3 05/11/2016 0827   CALCIUM 9.1 07/28/2014 1121   ALKPHOS 76 05/11/2016 0827   ALKPHOS 108 07/23/2014 1042   AST 21 05/11/2016 0827   AST 24 07/23/2014 1042   ALT 12 (L) 05/11/2016 0827   ALT 24 07/23/2014 1042   BILITOT 0.5 05/11/2016 0827   BILITOT 0.5 07/23/2014 1042     IMPRESSION: 1. Stable postradiation changes of mass-like fibrosis are again noted in the right upper lobe. No definite findings to suggest local recurrence of disease. 2. New and enlarging nodular areas in the left upper lobe and anterior aspect of the left lower lobe. Given the rapid development of these areas compared to the prior study, there strongly favored to be infectious or inflammatory in etiology, rather than  evidence of metastatic disease. Attention on followup studies is recommended to ensure its their stability or resolution. 3. Profound thickening of the distal 2/3 of the esophagus suggestive of esophagitis. If there is any clinical concern for neoplasm, this could be further evaluated with endoscopy. Small hiatal hernia. 4. Aortic atherosclerosis, in addition to left main and 3 vessel coronary artery disease. Please note that although the presence of coronary artery calcium documents the presence of coronary artery disease, the severity of this disease and any potential stenosis cannot be assessed on this non-gated CT examination. Assessment for potential risk factor modification, dietary therapy or pharmacologic therapy may be warranted, if clinically indicated. 5. Mild diffuse bronchial wall thickening with moderate centrilobular and paraseptal emphysema; imaging findings suggestive of underlying COPD. 6. Additional incidental findings, as above.   Electronically Signed   By: Vinnie Langton M.D.   On: 04/13/2016 09:00 RADIOGRAPHIC STUDIES: I have personally reviewed the radiological images as listed and agreed with the findings in the report. No results found.   ASSESSMENT & PLAN:  Malignant neoplasm of hilus of right lung ([place]) # SQUAMOUS Cell LUNG CA- STAGE IV- [ tumor nodule in the contralateral lung/ recurrence]; DEC 27th CT- STABLE. Currently on tecentriq. No concerns for clinical progression.     # proceed with treatment today. Patient tolerating treatment well. Labs unremarkable- except for mild anemia Hb 11.Marland KitchenTSH today pending.   # follow up in 3 weeks/labs.    Orders Placed This Encounter  Procedures  . TSH    Standing Status:   Future    Number of Occurrences:   1    Standing Expiration Date:   05/11/2017  . TSH    Standing Status:   Standing    Number of Occurrences:   20    Standing Expiration Date:   05/11/2017   All questions were answered. The  patient  knows to call the clinic with any problems, questions or concerns.      [name], MD 05/11/2016 5:39 PM       [name], MD 05/11/2016 5:39 PM

## 2016-05-11 NOTE — Assessment & Plan Note (Signed)
#   SQUAMOUS Cell LUNG CA- STAGE IV- [ tumor nodule in the contralateral lung/ recurrence]; DEC 27th CT- STABLE. Currently on tecentriq. No concerns for clinical progression.     # proceed with treatment today. Patient tolerating treatment well. Labs unremarkable- except for mild anemia Hb 11.Marland KitchenTSH today pending.   # follow up in 3 weeks/labs.

## 2016-05-11 NOTE — Progress Notes (Signed)
Patient here today for follow up.   

## 2016-05-11 NOTE — Progress Notes (Signed)
Signed active orders for Zofran-ODT '8mg'$  PO, Zofran '8mg'$  IVPB, and Ativan '1mg'$  IV in the treatment plan. MD, Dr. Rogue Bussing, notified via telephone for clarification of orders. Per MD order: patient is not to receive any of these medications today. Proceed with Tecentriq treatment only as planned today.

## 2016-05-16 ENCOUNTER — Other Ambulatory Visit: Payer: Self-pay | Admitting: Internal Medicine

## 2016-05-16 DIAGNOSIS — C3491 Malignant neoplasm of unspecified part of right bronchus or lung: Secondary | ICD-10-CM

## 2016-05-16 DIAGNOSIS — R112 Nausea with vomiting, unspecified: Secondary | ICD-10-CM

## 2016-06-01 ENCOUNTER — Inpatient Hospital Stay: Payer: Medicare Other

## 2016-06-01 ENCOUNTER — Inpatient Hospital Stay: Payer: Medicare Other | Attending: Internal Medicine

## 2016-06-01 ENCOUNTER — Inpatient Hospital Stay (HOSPITAL_BASED_OUTPATIENT_CLINIC_OR_DEPARTMENT_OTHER): Payer: Medicare Other | Admitting: Internal Medicine

## 2016-06-01 VITALS — BP 136/89 | HR 68 | Temp 97.9°F | Resp 18 | Ht 72.0 in | Wt 187.0 lb

## 2016-06-01 DIAGNOSIS — D649 Anemia, unspecified: Secondary | ICD-10-CM | POA: Insufficient documentation

## 2016-06-01 DIAGNOSIS — I10 Essential (primary) hypertension: Secondary | ICD-10-CM | POA: Diagnosis not present

## 2016-06-01 DIAGNOSIS — I700 Atherosclerosis of aorta: Secondary | ICD-10-CM

## 2016-06-01 DIAGNOSIS — I2510 Atherosclerotic heart disease of native coronary artery without angina pectoris: Secondary | ICD-10-CM | POA: Diagnosis not present

## 2016-06-01 DIAGNOSIS — Z923 Personal history of irradiation: Secondary | ICD-10-CM | POA: Insufficient documentation

## 2016-06-01 DIAGNOSIS — Y842 Radiological procedure and radiotherapy as the cause of abnormal reaction of the patient, or of later complication, without mention of misadventure at the time of the procedure: Secondary | ICD-10-CM | POA: Diagnosis not present

## 2016-06-01 DIAGNOSIS — Z5112 Encounter for antineoplastic immunotherapy: Secondary | ICD-10-CM | POA: Insufficient documentation

## 2016-06-01 DIAGNOSIS — J439 Emphysema, unspecified: Secondary | ICD-10-CM

## 2016-06-01 DIAGNOSIS — C3401 Malignant neoplasm of right main bronchus: Secondary | ICD-10-CM | POA: Diagnosis not present

## 2016-06-01 DIAGNOSIS — Z87891 Personal history of nicotine dependence: Secondary | ICD-10-CM | POA: Diagnosis not present

## 2016-06-01 DIAGNOSIS — F209 Schizophrenia, unspecified: Secondary | ICD-10-CM

## 2016-06-01 DIAGNOSIS — E119 Type 2 diabetes mellitus without complications: Secondary | ICD-10-CM | POA: Insufficient documentation

## 2016-06-01 DIAGNOSIS — Z86010 Personal history of colonic polyps: Secondary | ICD-10-CM | POA: Diagnosis not present

## 2016-06-01 DIAGNOSIS — E785 Hyperlipidemia, unspecified: Secondary | ICD-10-CM

## 2016-06-01 DIAGNOSIS — C7802 Secondary malignant neoplasm of left lung: Secondary | ICD-10-CM | POA: Insufficient documentation

## 2016-06-01 DIAGNOSIS — Z79899 Other long term (current) drug therapy: Secondary | ICD-10-CM | POA: Insufficient documentation

## 2016-06-01 DIAGNOSIS — E039 Hypothyroidism, unspecified: Secondary | ICD-10-CM | POA: Diagnosis not present

## 2016-06-01 DIAGNOSIS — C3491 Malignant neoplasm of unspecified part of right bronchus or lung: Secondary | ICD-10-CM

## 2016-06-01 LAB — CBC WITH DIFFERENTIAL/PLATELET
Basophils Absolute: 0 10*3/uL (ref 0–0.1)
Basophils Relative: 0 %
Eosinophils Absolute: 0 10*3/uL (ref 0–0.7)
Eosinophils Relative: 0 %
HEMATOCRIT: 34.8 % — AB (ref 40.0–52.0)
HEMOGLOBIN: 11.8 g/dL — AB (ref 13.0–18.0)
LYMPHS ABS: 1.2 10*3/uL (ref 1.0–3.6)
Lymphocytes Relative: 24 %
MCH: 29.6 pg (ref 26.0–34.0)
MCHC: 33.9 g/dL (ref 32.0–36.0)
MCV: 87.6 fL (ref 80.0–100.0)
MONOS PCT: 14 %
Monocytes Absolute: 0.7 10*3/uL (ref 0.2–1.0)
NEUTROS ABS: 3.1 10*3/uL (ref 1.4–6.5)
NEUTROS PCT: 62 %
Platelets: 296 10*3/uL (ref 150–440)
RBC: 3.98 MIL/uL — ABNORMAL LOW (ref 4.40–5.90)
RDW: 13.3 % (ref 11.5–14.5)
WBC: 4.9 10*3/uL (ref 3.8–10.6)

## 2016-06-01 LAB — COMPREHENSIVE METABOLIC PANEL
ALK PHOS: 71 U/L (ref 38–126)
ALT: 11 U/L — ABNORMAL LOW (ref 17–63)
ANION GAP: 4 — AB (ref 5–15)
AST: 17 U/L (ref 15–41)
Albumin: 3.6 g/dL (ref 3.5–5.0)
BUN: 12 mg/dL (ref 6–20)
CALCIUM: 9.2 mg/dL (ref 8.9–10.3)
CO2: 28 mmol/L (ref 22–32)
Chloride: 104 mmol/L (ref 101–111)
Creatinine, Ser: 0.85 mg/dL (ref 0.61–1.24)
GFR calc non Af Amer: >60 mL/min (ref >60)
GLUCOSE: 92 mg/dL (ref 65–99)
Potassium: 4.2 mmol/L (ref 3.5–5.1)
Sodium: 136 mmol/L (ref 135–145)
TOTAL PROTEIN: 6.3 g/dL — AB (ref 6.5–8.1)
Total Bilirubin: 0.5 mg/dL (ref 0.3–1.2)

## 2016-06-01 LAB — TSH: TSH: 1.864 u[IU]/mL (ref 0.350–4.500)

## 2016-06-01 MED ORDER — LORAZEPAM 2 MG/ML IJ SOLN
1.0000 mg | Freq: Once | INTRAMUSCULAR | Status: AC | PRN
  Administered 2016-06-01: 1 mg via INTRAVENOUS
  Filled 2016-06-01: qty 1

## 2016-06-01 MED ORDER — SODIUM CHLORIDE 0.9 % IV SOLN
Freq: Once | INTRAVENOUS | Status: AC
  Administered 2016-06-01: 09:00:00 via INTRAVENOUS
  Filled 2016-06-01: qty 1000

## 2016-06-01 MED ORDER — SODIUM CHLORIDE 0.9% FLUSH
10.0000 mL | INTRAVENOUS | Status: DC | PRN
  Administered 2016-06-01: 10 mL
  Filled 2016-06-01: qty 10

## 2016-06-01 MED ORDER — SODIUM CHLORIDE 0.9 % IV SOLN
1200.0000 mg | Freq: Once | INTRAVENOUS | Status: AC
  Administered 2016-06-01: 1200 mg via INTRAVENOUS
  Filled 2016-06-01: qty 20

## 2016-06-01 MED ORDER — HEPARIN SOD (PORK) LOCK FLUSH 100 UNIT/ML IV SOLN
500.0000 [IU] | Freq: Once | INTRAVENOUS | Status: AC | PRN
  Administered 2016-06-01: 500 [IU]
  Filled 2016-06-01: qty 5

## 2016-06-01 MED ORDER — SODIUM CHLORIDE 0.9 % IV SOLN: Freq: Once | INTRAVENOUS | Status: DC | PRN

## 2016-06-01 MED ORDER — ONDANSETRON 8 MG PO TBDP
8.0000 mg | ORAL_TABLET | Freq: Once | ORAL | Status: AC
  Administered 2016-06-01: 8 mg via ORAL
  Filled 2016-06-01: qty 1

## 2016-06-01 NOTE — Assessment & Plan Note (Addendum)
#   SQUAMOUS Cell LUNG CA- STAGE IV- [ tumor nodule in the contralateral lung/ recurrence]; DEC 27th CT- STABLE. Currently on tecentriq. No concerns for clinical progression.     # proceed with treatment today. Patient tolerating treatment well. Labs unremarkable- except for mild anemia Hb 11.6.TSH - Normal.   # follow up in 3 weeks/labs/treatment. Will order CT scan at next visit.  Discussed with pt/care giver.

## 2016-06-01 NOTE — Progress Notes (Signed)
Patient here today for f/u with lung cancer. He has no medical complaints.

## 2016-06-01 NOTE — Progress Notes (Signed)
[place] OFFICE PROGRESS NOTE  Patient Care Team: [name] as PCP - General (Internal Medicine)  [place] OFFICE PROGRESS NOTE  Patient Care Team: [name] as PCP - General (Internal Medicine)  Squamous cell carcinoma of right lung [facility])   Staging form: Lung, AJCC 7th Edition     Clinical stage from 07/17/2014: Stage IV (T4, N0, M1a) - Signed by Leia Alf, MD on 09/29/2014    Oncology History   # April 2016- SQUAMOUS CELL of LUNG ca [RUL] STAGE III; [EBUS-Right hilum];  (? Stage IV Left lung nodule- not Bx)   # FEB 2017-STAGE IV [contralateral lung nodules] PET Recurrence/Progression [high risk for [facility] 2017- START Tecentriq q 3W x3; April 2017 CT- LUL Stable/slight increase in size [~68m]; new lingula ~262m improved RUL radiation changes; Improved basilar nodules ? Atypical or infectious etilogy; JUne 12th CT-STABLE LUL.  # Schizophrenia/ group home resident     Malignant neoplasm of hilus of right lung ([facility]  10/28/2015 Initial Diagnosis    [facility]align[facility] hilus of right lung ([facility]       INTERVAL HISTORY: Poor historian given his schizophrenia. Accompanied by caregiver.  [age]ear old male patient with above history ofMetastatic squamous cell lung cancer is here for follow-up/ currently on Tecentriq.    Appetite is good. No weight loss. No new shortness of breath or cough. No nausea no vomiting or diarrhea. No headaches. No skin rash. No diarrhea.  REVIEW OF SYSTEMS:  Difficult to assess given his poor history giving skills.  PAST MEDICAL HISTORY :  Past Medical History:  Diagnosis Date  . Chronic fatigue   . Chronic fatigue   . Colon polyps   . Diabetes mellitus without complication (HCCatal[facility]  . Hyperlipemia   . Hypertension   . Hypothyroidism   . Schizophrenia (HCBell[facility]  . Squamous cell carcinoma of right lung (HCMinonk5/09/2014    PAST SURGICAL HISTORY :   Past Surgical History:  Procedure Laterality  Date  . COLONOSCOPY     history of colon polyps  . ENDOBRONCHIAL ULTRASOUND  07/30/2014   right hilum FNA/EBUS  . PERIPHERAL VASCULAR CATHETERIZATION N/A 08/21/2014   Procedure: PoGlori Luisath Insertion;  Surgeon: JaAlgernon HuxleyMD;  [facility];  Service: Cardiovascular;  Laterality: N/A;    FAMILY HISTORY :  No family history on file.  SOCIAL HISTORY:   Social History  Substance Use Topics  . Smoking status: Former Smoker    Packs/day: 0.25    Years: 42.00    Types: Cigarettes  . Smokeless tobacco: Never Used  . Alcohol use No    ALLERGIES:  has No Known Allergies.  MEDICATIONS:  Current Outpatient Prescriptions  Medication Sig Dispense Refill  . atorvastatin (LIPITOR) 10 MG tablet Take 10 mg by mouth every morning.     . ferrous sulfate 325 (65 FE) MG tablet Take 325 mg by mouth daily with breakfast.    . levothyroxine (SYNTHROID, LEVOTHROID) 88 MCG tablet Take 88 mcg by mouth every morning.     . Marland Kitchenisinopril (PRINIVIL,ZESTRIL) 10 MG tablet Take 10 mg by mouth daily.    . Marland KitchenLANZapine (ZYPREXA) 20 MG tablet Take 30 mg by mouth at bedtime.     . propranolol (INDERAL) 10 MG tablet Take 10 mg by mouth 3 (three) times daily.    . sucralfate (CARAFATE) 1 g tablet Take 1 g by mouth 4 (four) times daily -  with meals and at  bedtime.    . calcium carbonate (TUMS - DOSED IN MG ELEMENTAL CALCIUM) 500 MG chewable tablet Chew 1 tablet by mouth 2 (two) times daily.    . ondansetron (ZOFRAN-ODT) 4 MG disintegrating tablet Take 4 mg by mouth every 8 (eight) hours as needed for nausea or vomiting.    . prochlorperazine (COMPAZINE) 10 MG tablet TAKE 1 TABLET BY MOUTH EVERY 6 HOURS AS NEEDED FOR NAUSEA & VOMITING (Patient not taking: Reported on 06/01/2016) 30 tablet 0   Current Facility-Administered Medications  Medication Dose Route Frequency Provider Last Rate Last Dose  . sodium chloride flush (NS) 0.9 % injection 10 mL  10 mL Intracatheter PRN [name], MD   10 mL at  06/01/16 0830   Facility-Administered Medications Ordered in Other Visits  Medication Dose Route Frequency Provider Last Rate Last Dose  . sodium chloride flush (NS) 0.9 % injection 10 mL  10 mL Intracatheter PRN Lloyd Huger, MD   10 mL at 02/10/16 0940    PHYSICAL EXAMINATION: ECOG PERFORMANCE STATUS: 0 - Asymptomatic  BP 136/89 (BP Location: Left Arm, Patient Position: Sitting)   Pulse 68   Temp 97.9 F (36.6 C) (Tympanic)   Resp 18   Ht 6' (1.829 m)   Wt 187 lb (84.8 kg)   BMI 25.36 kg/m   Filed Weights   06/01/16 0846  Weight: 187 lb (84.8 kg)    GENERAL: Well-nourished well-developed; Alert, no distress and comfortable.  Accompanied by caregiver. EYES: no pallor or icterus OROPHARYNX: no thrush or ulceration; good dentition  NECK: supple, no masses felt LYMPH:  no palpable lymphadenopathy in the cervical, axillary or inguinal regions LUNGS: clear to auscultation and  No wheeze or crackles HEART/CVS: regular rate & rhythm and no murmurs; No lower extremity edema ABDOMEN:abdomen soft, non-tender and normal bowel sounds Musculoskeletal:no cyanosis of digits and no clubbing  PSYCH: alert & oriented x 3 with fluent speech NEURO: no focal motor/sensory deficits SKIN:  no rashes or significant lesions  LABORATORY DATA:  I have reviewed the data as listed    Component Value Date/Time   NA 136 06/01/2016 0815   NA 135 07/28/2014 1121   K 4.2 06/01/2016 0815   K 4.3 07/28/2014 1121   CL 104 06/01/2016 0815   CL 101 07/28/2014 1121   CO2 28 06/01/2016 0815   CO2 25 07/28/2014 1121   GLUCOSE 92 06/01/2016 0815   GLUCOSE 135 (H) 07/28/2014 1121   BUN 12 06/01/2016 0815   BUN 19 07/28/2014 1121   CREATININE 0.85 06/01/2016 0815   CREATININE 1.36 (H) 07/28/2014 1121   CALCIUM 9.2 06/01/2016 0815   CALCIUM 9.1 07/28/2014 1121   PROT 6.3 (L) 06/01/2016 0815   PROT 7.6 07/23/2014 1042   ALBUMIN 3.6 06/01/2016 0815   ALBUMIN 4.2 07/23/2014 1042   AST 17  06/01/2016 0815   AST 24 07/23/2014 1042   ALT 11 (L) 06/01/2016 0815   ALT 24 07/23/2014 1042   ALKPHOS 71 06/01/2016 0815   ALKPHOS 108 07/23/2014 1042   BILITOT 0.5 06/01/2016 0815   BILITOT 0.5 07/23/2014 1042   GFRNONAA >60 06/01/2016 0815   GFRNONAA 57 (L) 07/28/2014 1121   GFRAA >60 06/01/2016 0815   GFRAA >60 07/28/2014 1121    No results found for: SPEP, UPEP  Lab Results  Component Value Date   WBC 4.9 06/01/2016   NEUTROABS 3.1 06/01/2016   HGB 11.8 (L) 06/01/2016   HCT 34.8 (L) 06/01/2016   MCV  87.6 06/01/2016   PLT 296 06/01/2016      Chemistry      Component Value Date/Time   NA 136 06/01/2016 0815   NA 135 07/28/2014 1121   K 4.2 06/01/2016 0815   K 4.3 07/28/2014 1121   CL 104 06/01/2016 0815   CL 101 07/28/2014 1121   CO2 28 06/01/2016 0815   CO2 25 07/28/2014 1121   BUN 12 06/01/2016 0815   BUN 19 07/28/2014 1121   CREATININE 0.85 06/01/2016 0815   CREATININE 1.36 (H) 07/28/2014 1121      Component Value Date/Time   CALCIUM 9.2 06/01/2016 0815   CALCIUM 9.1 07/28/2014 1121   ALKPHOS 71 06/01/2016 0815   ALKPHOS 108 07/23/2014 1042   AST 17 06/01/2016 0815   AST 24 07/23/2014 1042   ALT 11 (L) 06/01/2016 0815   ALT 24 07/23/2014 1042   BILITOT 0.5 06/01/2016 0815   BILITOT 0.5 07/23/2014 1042     IMPRESSION: 1. Stable postradiation changes of mass-like fibrosis are again noted in the right upper lobe. No definite findings to suggest local recurrence of disease. 2. New and enlarging nodular areas in the left upper lobe and anterior aspect of the left lower lobe. Given the rapid development of these areas compared to the prior study, there strongly favored to be infectious or inflammatory in etiology, rather than evidence of metastatic disease. Attention on followup studies is recommended to ensure its their stability or resolution. 3. Profound thickening of the distal 2/3 of the esophagus suggestive of esophagitis. If there is any  clinical concern for neoplasm, this could be further evaluated with endoscopy. Small hiatal hernia. 4. Aortic atherosclerosis, in addition to left main and 3 vessel coronary artery disease. Please note that although the presence of coronary artery calcium documents the presence of coronary artery disease, the severity of this disease and any potential stenosis cannot be assessed on this non-gated CT examination. Assessment for potential risk factor modification, dietary therapy or pharmacologic therapy may be warranted, if clinically indicated. 5. Mild diffuse bronchial wall thickening with moderate centrilobular and paraseptal emphysema; imaging findings suggestive of underlying COPD. 6. Additional incidental findings, as above.   Electronically Signed   By: Vinnie Langton M.D.   On: 04/13/2016 09:00 RADIOGRAPHIC STUDIES: I have personally reviewed the radiological images as listed and agreed with the findings in the report. No results found.   ASSESSMENT & PLAN:  Malignant neoplasm of hilus of right lung ([place]) # SQUAMOUS Cell LUNG CA- STAGE IV- [ tumor nodule in the contralateral lung/ recurrence]; DEC 27th CT- STABLE. Currently on tecentriq. No concerns for clinical progression.     # proceed with treatment today. Patient tolerating treatment well. Labs unremarkable- except for mild anemia Hb 11.6.TSH - Normal.   # follow up in 3 weeks/labs/treatment. Will order CT scan at next visit.  Discussed with pt/care giver.    Orders Placed This Encounter  Procedures  . PHYSICIAN COMMUNICATION ORDER    TSH baseline and every 3rd cycle.  Marland Kitchen [place] COMMUNICATION    Chemotherapy Appointment  - 1 hour   . TREATMENT CONDITIONS    Patient should have CBC & CMP within 7 days prior to chemotherapy administration. NOTIFY MD IF: ANC < 1500, Hemoglobin < 8, PLT < 100,000, Creatinine > 1.5, ALT/AST > 3-5 x ULN, total bilirubin > 1.5 -3 times ULN or if patient has unstable vital signs:  Temperature > 38.5, SBP > 180 or < 90, RR > 30 or  HR > 100.   All questions were answered. The patient knows to call the clinic with any problems, questions or concerns.      [name], MD 06/01/2016 4:19 PM       [name], MD 06/01/2016 4:19 PM

## 2016-06-22 ENCOUNTER — Inpatient Hospital Stay: Payer: Medicare Other | Attending: Internal Medicine | Admitting: Internal Medicine

## 2016-06-22 ENCOUNTER — Inpatient Hospital Stay: Payer: Medicare Other

## 2016-06-22 ENCOUNTER — Other Ambulatory Visit: Payer: Self-pay | Admitting: Internal Medicine

## 2016-06-22 VITALS — BP 138/87 | HR 67 | Temp 94.2°F | Wt 195.0 lb

## 2016-06-22 DIAGNOSIS — E119 Type 2 diabetes mellitus without complications: Secondary | ICD-10-CM | POA: Diagnosis not present

## 2016-06-22 DIAGNOSIS — Z87891 Personal history of nicotine dependence: Secondary | ICD-10-CM

## 2016-06-22 DIAGNOSIS — Z79899 Other long term (current) drug therapy: Secondary | ICD-10-CM | POA: Diagnosis not present

## 2016-06-22 DIAGNOSIS — J439 Emphysema, unspecified: Secondary | ICD-10-CM

## 2016-06-22 DIAGNOSIS — C3401 Malignant neoplasm of right main bronchus: Secondary | ICD-10-CM

## 2016-06-22 DIAGNOSIS — Y842 Radiological procedure and radiotherapy as the cause of abnormal reaction of the patient, or of later complication, without mention of misadventure at the time of the procedure: Secondary | ICD-10-CM | POA: Diagnosis not present

## 2016-06-22 DIAGNOSIS — K449 Diaphragmatic hernia without obstruction or gangrene: Secondary | ICD-10-CM

## 2016-06-22 DIAGNOSIS — E785 Hyperlipidemia, unspecified: Secondary | ICD-10-CM | POA: Diagnosis not present

## 2016-06-22 DIAGNOSIS — R112 Nausea with vomiting, unspecified: Secondary | ICD-10-CM

## 2016-06-22 DIAGNOSIS — C3491 Malignant neoplasm of unspecified part of right bronchus or lung: Secondary | ICD-10-CM

## 2016-06-22 DIAGNOSIS — R1310 Dysphagia, unspecified: Secondary | ICD-10-CM | POA: Diagnosis not present

## 2016-06-22 DIAGNOSIS — Z923 Personal history of irradiation: Secondary | ICD-10-CM

## 2016-06-22 DIAGNOSIS — F209 Schizophrenia, unspecified: Secondary | ICD-10-CM

## 2016-06-22 DIAGNOSIS — Z86010 Personal history of colonic polyps: Secondary | ICD-10-CM

## 2016-06-22 DIAGNOSIS — K209 Esophagitis, unspecified: Secondary | ICD-10-CM | POA: Diagnosis not present

## 2016-06-22 DIAGNOSIS — C7802 Secondary malignant neoplasm of left lung: Secondary | ICD-10-CM

## 2016-06-22 DIAGNOSIS — I10 Essential (primary) hypertension: Secondary | ICD-10-CM | POA: Diagnosis not present

## 2016-06-22 DIAGNOSIS — Z5112 Encounter for antineoplastic immunotherapy: Secondary | ICD-10-CM | POA: Insufficient documentation

## 2016-06-22 DIAGNOSIS — D649 Anemia, unspecified: Secondary | ICD-10-CM | POA: Diagnosis not present

## 2016-06-22 DIAGNOSIS — E039 Hypothyroidism, unspecified: Secondary | ICD-10-CM | POA: Diagnosis not present

## 2016-06-22 DIAGNOSIS — I700 Atherosclerosis of aorta: Secondary | ICD-10-CM

## 2016-06-22 DIAGNOSIS — R5382 Chronic fatigue, unspecified: Secondary | ICD-10-CM

## 2016-06-22 LAB — CBC WITH DIFFERENTIAL/PLATELET
BASOS PCT: 0 %
Basophils Absolute: 0 10*3/uL (ref 0–0.1)
EOS ABS: 0 10*3/uL (ref 0–0.7)
Eosinophils Relative: 0 %
HCT: 36 % — ABNORMAL LOW (ref 40.0–52.0)
HEMOGLOBIN: 12.3 g/dL — AB (ref 13.0–18.0)
Lymphocytes Relative: 20 %
Lymphs Abs: 1.2 10*3/uL (ref 1.0–3.6)
MCH: 29.4 pg (ref 26.0–34.0)
MCHC: 34.1 g/dL (ref 32.0–36.0)
MCV: 86.2 fL (ref 80.0–100.0)
MONOS PCT: 12 %
Monocytes Absolute: 0.8 10*3/uL (ref 0.2–1.0)
NEUTROS PCT: 68 %
Neutro Abs: 4.2 10*3/uL (ref 1.4–6.5)
Platelets: 288 10*3/uL (ref 150–440)
RBC: 4.17 MIL/uL — ABNORMAL LOW (ref 4.40–5.90)
RDW: 13.5 % (ref 11.5–14.5)
WBC: 6.3 10*3/uL (ref 3.8–10.6)

## 2016-06-22 LAB — COMPREHENSIVE METABOLIC PANEL
ALBUMIN: 3.8 g/dL (ref 3.5–5.0)
ALK PHOS: 85 U/L (ref 38–126)
ALT: 11 U/L — ABNORMAL LOW (ref 17–63)
ANION GAP: 5 (ref 5–15)
AST: 18 U/L (ref 15–41)
BUN: 13 mg/dL (ref 6–20)
CHLORIDE: 103 mmol/L (ref 101–111)
CO2: 27 mmol/L (ref 22–32)
Calcium: 9.3 mg/dL (ref 8.9–10.3)
Creatinine, Ser: 0.83 mg/dL (ref 0.61–1.24)
GFR calc Af Amer: >60 mL/min (ref >60)
GFR calc non Af Amer: >60 mL/min (ref >60)
GLUCOSE: 88 mg/dL (ref 65–99)
POTASSIUM: 4.2 mmol/L (ref 3.5–5.1)
SODIUM: 135 mmol/L (ref 135–145)
Total Bilirubin: 0.4 mg/dL (ref 0.3–1.2)
Total Protein: 6.7 g/dL (ref 6.5–8.1)

## 2016-06-22 LAB — TSH: TSH: 2.777 u[IU]/mL (ref 0.350–4.500)

## 2016-06-22 MED ORDER — ATEZOLIZUMAB CHEMO INJECTION 1200 MG/20ML
1200.0000 mg | Freq: Once | INTRAVENOUS | Status: AC
  Administered 2016-06-22: 1200 mg via INTRAVENOUS
  Filled 2016-06-22: qty 20

## 2016-06-22 MED ORDER — HEPARIN SOD (PORK) LOCK FLUSH 100 UNIT/ML IV SOLN
500.0000 [IU] | Freq: Once | INTRAVENOUS | Status: AC | PRN
  Administered 2016-06-22: 500 [IU]
  Filled 2016-06-22: qty 5

## 2016-06-22 MED ORDER — SODIUM CHLORIDE 0.9 % IV SOLN
Freq: Once | INTRAVENOUS | Status: AC
  Administered 2016-06-22: 09:00:00 via INTRAVENOUS
  Filled 2016-06-22: qty 1000

## 2016-06-22 MED ORDER — SODIUM CHLORIDE 0.9% FLUSH
10.0000 mL | INTRAVENOUS | Status: DC | PRN
  Administered 2016-06-22: 10 mL
  Filled 2016-06-22: qty 10

## 2016-06-22 NOTE — Assessment & Plan Note (Addendum)
#   SQUAMOUS Cell LUNG CA- STAGE IV- [ tumor nodule in the contralateral lung/ recurrence]; DEC 27th CT- STABLE. Currently on tecentriq. No concerns for clinical progression.     # proceed with treatment today. Patient tolerating treatment well. Labs unremarkable- except for mild anemia Hb 12.3.TSH - Normal.   # follow up in 3 weeks/labs/treatment/ CT scan prior to next visit.   Discussed with pt/care giver.

## 2016-06-22 NOTE — Progress Notes (Signed)
[place] OFFICE PROGRESS NOTE  Patient Care Team: [name] as PCP - General (Internal Medicine)  [place] OFFICE PROGRESS NOTE  Patient Care Team: [name] as PCP - General (Internal Medicine)  Squamous cell carcinoma of right lung [facility])   Staging form: Lung, AJCC 7th Edition     Clinical stage from 07/17/2014: Stage IV (T4, N0, M1a) - Signed by Leia Alf, MD on 09/29/2014    Oncology History   # April 2016- SQUAMOUS CELL of LUNG ca [RUL] STAGE III; [EBUS-Right hilum];  (? Stage IV Left lung nodule- not Bx)   # FEB 2017-STAGE IV [contralateral lung nodules] PET Recurrence/Progression [high risk for [facility] 2017- START Tecentriq q 3W x3; April 2017 CT- LUL Stable/slight increase in size [~73m]; new lingula ~231m improved RUL radiation changes; Improved basilar nodules ? Atypical or infectious etilogy; JUne 12th CT-STABLE LUL.  # Schizophrenia/ group home resident     Malignant neoplasm of hilus of right lung (HCHam[place]  10/28/2015 Initial Diagnosis    Malignant neoplasm of hilus of right lung (HCMcDonald Chapel       INTERVAL HISTORY: Poor historian given his schizophrenia. Accompanied by caregiver.  [age]ear old male patient with above history ofMetastatic squamous cell lung cancer is here for follow-up/ currently on Tecentriq.    No shortness of breath or cough. No hemoptysis. No diarrhea. No skin rash. No headaches or vision changes.  REVIEW OF SYSTEMS:  Difficult to assess given his poor history giving skills.  PAST MEDICAL HISTORY :  Past Medical History:  Diagnosis Date  . Chronic fatigue   . Chronic fatigue   . Colon polyps   . Diabetes mellitus without complication (HCChalkyits[place]  . Hyperlipemia   . Hypertension   . Hypothyroidism   . Schizophrenia (HCMadison  . Squamous cell carcinoma of right lung (HCFlemington5/09/2014    PAST SURGICAL HISTORY :   Past Surgical History:  Procedure Laterality Date  . COLONOSCOPY     history of  colon polyps  . ENDOBRONCHIAL ULTRASOUND  07/30/2014   right hilum FNA/EBUS  . PERIPHERAL VASCULAR CATHETERIZATION N/A 08/21/2014   Procedure: PoGlori Luisath Insertion;  Surgeon: JaAlgernon HuxleyMD;  [facility];  Service: Cardiovascular;  Laterality: N/A;    FAMILY HISTORY :  No family history on file.  SOCIAL HISTORY:   Social History  Substance Use Topics  . Smoking status: Former Smoker    Packs/day: 0.25    Years: 42.00    Types: Cigarettes  . Smokeless tobacco: Never Used  . Alcohol use No    ALLERGIES:  has No Known Allergies.  MEDICATIONS:  Current Outpatient Prescriptions  Medication Sig Dispense Refill  . atorvastatin (LIPITOR) 10 MG tablet Take 10 mg by mouth every morning.     . calcium carbonate (TUMS - DOSED IN MG ELEMENTAL CALCIUM) 500 MG chewable tablet Chew 1 tablet by mouth 2 (two) times daily.    . ferrous sulfate 325 (65 FE) MG tablet Take 325 mg by mouth daily with breakfast.    . levothyroxine (SYNTHROID, LEVOTHROID) 88 MCG tablet Take 88 mcg by mouth every morning.     . Marland Kitchenisinopril (PRINIVIL,ZESTRIL) 10 MG tablet Take 10 mg by mouth daily.    . Marland KitchenLANZapine (ZYPREXA) 20 MG tablet Take 30 mg by mouth at bedtime.     . ondansetron (ZOFRAN-ODT) 4 MG disintegrating tablet Take 4 mg by mouth every 8 (eight) hours as needed for nausea or  vomiting.    . propranolol (INDERAL) 10 MG tablet Take 10 mg by mouth 3 (three) times daily.    . sucralfate (CARAFATE) 1 g tablet Take 1 g by mouth 4 (four) times daily -  with meals and at bedtime.    . prochlorperazine (COMPAZINE) 10 MG tablet TAKE 1 TABLET BY MOUTH EVERY 6 HOURS AS NEEDED FOR NAUSEA & VOMITING 30 tablet 0   No current facility-administered medications for this visit.    Facility-Administered Medications Ordered in Other Visits  Medication Dose Route Frequency Provider Last Rate Last Dose  . sodium chloride flush (NS) 0.9 % injection 10 mL  10 mL Intracatheter PRN Lloyd Huger, MD   10 mL at  02/10/16 0940    PHYSICAL EXAMINATION: ECOG PERFORMANCE STATUS: 0 - Asymptomatic  BP 138/87 (BP Location: Right Arm, Patient Position: Sitting)   Pulse 67   Temp (!) 94.2 F (34.6 C) (Tympanic)   Wt 195 lb (88.5 kg)   BMI 26.45 kg/m   Filed Weights   06/22/16 0847  Weight: 195 lb (88.5 kg)    GENERAL: Well-nourished well-developed; Alert, no distress and comfortable.  Accompanied by caregiver. EYES: no pallor or icterus OROPHARYNX: no thrush or ulceration; good dentition  NECK: supple, no masses felt LYMPH:  no palpable lymphadenopathy in the cervical, axillary or inguinal regions LUNGS: clear to auscultation and  No wheeze or crackles HEART/CVS: regular rate & rhythm and no murmurs; No lower extremity edema ABDOMEN:abdomen soft, non-tender and normal bowel sounds Musculoskeletal:no cyanosis of digits and no clubbing  PSYCH: alert & oriented x 3 with fluent speech NEURO: no focal motor/sensory deficits SKIN:  no rashes or significant lesions  LABORATORY DATA:  I have reviewed the data as listed    Component Value Date/Time   NA 135 06/22/2016 0822   NA 135 07/28/2014 1121   K 4.2 06/22/2016 0822   K 4.3 07/28/2014 1121   CL 103 06/22/2016 0822   CL 101 07/28/2014 1121   CO2 27 06/22/2016 0822   CO2 25 07/28/2014 1121   GLUCOSE 88 06/22/2016 0822   GLUCOSE 135 (H) 07/28/2014 1121   BUN 13 06/22/2016 0822   BUN 19 07/28/2014 1121   CREATININE 0.83 06/22/2016 0822   CREATININE 1.36 (H) 07/28/2014 1121   CALCIUM 9.3 06/22/2016 0822   CALCIUM 9.1 07/28/2014 1121   PROT 6.7 06/22/2016 0822   PROT 7.6 07/23/2014 1042   ALBUMIN 3.8 06/22/2016 0822   ALBUMIN 4.2 07/23/2014 1042   AST 18 06/22/2016 0822   AST 24 07/23/2014 1042   ALT 11 (L) 06/22/2016 0822   ALT 24 07/23/2014 1042   ALKPHOS 85 06/22/2016 0822   ALKPHOS 108 07/23/2014 1042   BILITOT 0.4 06/22/2016 0822   BILITOT 0.5 07/23/2014 1042   GFRNONAA >60 06/22/2016 0822   GFRNONAA 57 (L) 07/28/2014 1121    GFRAA >60 06/22/2016 0822   GFRAA >60 07/28/2014 1121    No results found for: SPEP, UPEP  Lab Results  Component Value Date   WBC 6.3 06/22/2016   NEUTROABS 4.2 06/22/2016   HGB 12.3 (L) 06/22/2016   HCT 36.0 (L) 06/22/2016   MCV 86.2 06/22/2016   PLT 288 06/22/2016      Chemistry      Component Value Date/Time   NA 135 06/22/2016 0822   NA 135 07/28/2014 1121   K 4.2 06/22/2016 0822   K 4.3 07/28/2014 1121   CL 103 06/22/2016 0822   CL 101 07/28/2014  1121   CO2 27 06/22/2016 0822   CO2 25 07/28/2014 1121   BUN 13 06/22/2016 0822   BUN 19 07/28/2014 1121   CREATININE 0.83 06/22/2016 0822   CREATININE 1.36 (H) 07/28/2014 1121      Component Value Date/Time   CALCIUM 9.3 06/22/2016 0822   CALCIUM 9.1 07/28/2014 1121   ALKPHOS 85 06/22/2016 0822   ALKPHOS 108 07/23/2014 1042   AST 18 06/22/2016 0822   AST 24 07/23/2014 1042   ALT 11 (L) 06/22/2016 0822   ALT 24 07/23/2014 1042   BILITOT 0.4 06/22/2016 0822   BILITOT 0.5 07/23/2014 1042     IMPRESSION: 1. Stable postradiation changes of mass-like fibrosis are again noted in the right upper lobe. No definite findings to suggest local recurrence of disease. 2. New and enlarging nodular areas in the left upper lobe and anterior aspect of the left lower lobe. Given the rapid development of these areas compared to the prior study, there strongly favored to be infectious or inflammatory in etiology, rather than evidence of metastatic disease. Attention on followup studies is recommended to ensure its their stability or resolution. 3. Profound thickening of the distal 2/3 of the esophagus suggestive of esophagitis. If there is any clinical concern for neoplasm, this could be further evaluated with endoscopy. Small hiatal hernia. 4. Aortic atherosclerosis, in addition to left main and 3 vessel coronary artery disease. Please note that although the presence of coronary artery calcium documents the presence of  coronary artery disease, the severity of this disease and any potential stenosis cannot be assessed on this non-gated CT examination. Assessment for potential risk factor modification, dietary therapy or pharmacologic therapy may be warranted, if clinically indicated. 5. Mild diffuse bronchial wall thickening with moderate centrilobular and paraseptal emphysema; imaging findings suggestive of underlying COPD. 6. Additional incidental findings, as above.   Electronically Signed   By: Vinnie Langton M.D.   On: 04/13/2016 09:00 RADIOGRAPHIC STUDIES: I have personally reviewed the radiological images as listed and agreed with the findings in the report. No results found.   ASSESSMENT & PLAN:  Malignant neoplasm of hilus of right lung ([place]) # SQUAMOUS Cell LUNG CA- STAGE IV- [ tumor nodule in the contralateral lung/ recurrence]; DEC 27th CT- STABLE. Currently on tecentriq. No concerns for clinical progression.     # proceed with treatment today. Patient tolerating treatment well. Labs unremarkable- except for mild anemia Hb 12.3.TSH - Normal.   # follow up in 3 weeks/labs/treatment/ CT scan prior to next visit.   Discussed with pt/care giver.    Orders Placed This Encounter  Procedures  . CT CHEST W CONTRAST    Standing Status:   Future    Standing Expiration Date:   08/22/2017    Order Specific Question:   Reason for Exam (SYMPTOM  OR DIAGNOSIS REQUIRED)    Answer:   lung cancer    Order Specific Question:   Preferred imaging location?    Answer:   [facility]   All questions were answered. The patient knows to call the clinic with any problems, questions or concerns.      [name], MD 06/22/2016 3:27 PM       [name], MD 06/22/2016 3:27 PM

## 2016-06-22 NOTE — Progress Notes (Signed)
Patient here today for follow up.   

## 2016-07-12 ENCOUNTER — Ambulatory Visit
Admission: RE | Admit: 2016-07-12 | Discharge: 2016-07-12 | Disposition: A | Discharge status: Home / Self Care | Payer: Medicare Other | Source: Ambulatory Visit | Attending: Internal Medicine | Admitting: Internal Medicine

## 2016-07-12 DIAGNOSIS — C3401 Malignant neoplasm of right main bronchus: Secondary | ICD-10-CM | POA: Insufficient documentation

## 2016-07-12 DIAGNOSIS — Y842 Radiological procedure and radiotherapy as the cause of abnormal reaction of the patient, or of later complication, without mention of misadventure at the time of the procedure: Secondary | ICD-10-CM | POA: Diagnosis not present

## 2016-07-12 DIAGNOSIS — Z9889 Other specified postprocedural states: Secondary | ICD-10-CM | POA: Insufficient documentation

## 2016-07-12 DIAGNOSIS — K229 Disease of esophagus, unspecified: Secondary | ICD-10-CM | POA: Diagnosis not present

## 2016-07-12 DIAGNOSIS — R918 Other nonspecific abnormal finding of lung field: Secondary | ICD-10-CM | POA: Insufficient documentation

## 2016-07-12 MED ORDER — IOPAMIDOL (ISOVUE-300) INJECTION 61%
75.0000 mL | Freq: Once | INTRAVENOUS | Status: AC | PRN
  Administered 2016-07-12: 75 mL via INTRAVENOUS

## 2016-07-13 ENCOUNTER — Inpatient Hospital Stay: Payer: Medicare Other

## 2016-07-13 ENCOUNTER — Telehealth: Payer: Self-pay | Admitting: Gastroenterology

## 2016-07-13 ENCOUNTER — Inpatient Hospital Stay (HOSPITAL_BASED_OUTPATIENT_CLINIC_OR_DEPARTMENT_OTHER): Payer: Medicare Other | Admitting: Internal Medicine

## 2016-07-13 VITALS — BP 118/77 | HR 62 | Temp 96.3°F | Resp 16 | Wt 198.4 lb

## 2016-07-13 DIAGNOSIS — R209 Unspecified disturbances of skin sensation: Secondary | ICD-10-CM

## 2016-07-13 DIAGNOSIS — C3401 Malignant neoplasm of right main bronchus: Secondary | ICD-10-CM

## 2016-07-13 DIAGNOSIS — C7802 Secondary malignant neoplasm of left lung: Secondary | ICD-10-CM | POA: Diagnosis not present

## 2016-07-13 DIAGNOSIS — Z923 Personal history of irradiation: Secondary | ICD-10-CM

## 2016-07-13 DIAGNOSIS — R5382 Chronic fatigue, unspecified: Secondary | ICD-10-CM

## 2016-07-13 DIAGNOSIS — E039 Hypothyroidism, unspecified: Secondary | ICD-10-CM

## 2016-07-13 DIAGNOSIS — D649 Anemia, unspecified: Secondary | ICD-10-CM | POA: Diagnosis not present

## 2016-07-13 DIAGNOSIS — R1310 Dysphagia, unspecified: Secondary | ICD-10-CM

## 2016-07-13 DIAGNOSIS — Z5112 Encounter for antineoplastic immunotherapy: Secondary | ICD-10-CM | POA: Diagnosis not present

## 2016-07-13 DIAGNOSIS — J439 Emphysema, unspecified: Secondary | ICD-10-CM

## 2016-07-13 DIAGNOSIS — E119 Type 2 diabetes mellitus without complications: Secondary | ICD-10-CM

## 2016-07-13 DIAGNOSIS — Z86010 Personal history of colonic polyps: Secondary | ICD-10-CM

## 2016-07-13 DIAGNOSIS — Z79899 Other long term (current) drug therapy: Secondary | ICD-10-CM

## 2016-07-13 DIAGNOSIS — C3491 Malignant neoplasm of unspecified part of right bronchus or lung: Secondary | ICD-10-CM

## 2016-07-13 DIAGNOSIS — E785 Hyperlipidemia, unspecified: Secondary | ICD-10-CM

## 2016-07-13 DIAGNOSIS — Y842 Radiological procedure and radiotherapy as the cause of abnormal reaction of the patient, or of later complication, without mention of misadventure at the time of the procedure: Secondary | ICD-10-CM

## 2016-07-13 DIAGNOSIS — K209 Esophagitis, unspecified: Secondary | ICD-10-CM

## 2016-07-13 DIAGNOSIS — I10 Essential (primary) hypertension: Secondary | ICD-10-CM

## 2016-07-13 DIAGNOSIS — I700 Atherosclerosis of aorta: Secondary | ICD-10-CM

## 2016-07-13 DIAGNOSIS — K449 Diaphragmatic hernia without obstruction or gangrene: Secondary | ICD-10-CM

## 2016-07-13 DIAGNOSIS — Z87891 Personal history of nicotine dependence: Secondary | ICD-10-CM

## 2016-07-13 LAB — COMPREHENSIVE METABOLIC PANEL
ALBUMIN: 3.5 g/dL (ref 3.5–5.0)
ALT: 9 U/L — AB (ref 17–63)
AST: 19 U/L (ref 15–41)
Alkaline Phosphatase: 71 U/L (ref 38–126)
Anion gap: 3 — ABNORMAL LOW (ref 5–15)
BILIRUBIN TOTAL: 0.4 mg/dL (ref 0.3–1.2)
BUN: 13 mg/dL (ref 6–20)
CHLORIDE: 105 mmol/L (ref 101–111)
CO2: 29 mmol/L (ref 22–32)
CREATININE: 0.79 mg/dL (ref 0.61–1.24)
Calcium: 9.2 mg/dL (ref 8.9–10.3)
GFR calc Af Amer: >60 mL/min (ref >60)
GFR calc non Af Amer: >60 mL/min (ref >60)
GLUCOSE: 107 mg/dL — AB (ref 65–99)
POTASSIUM: 4.1 mmol/L (ref 3.5–5.1)
Sodium: 137 mmol/L (ref 135–145)
Total Protein: 6.4 g/dL — ABNORMAL LOW (ref 6.5–8.1)

## 2016-07-13 LAB — CBC WITH DIFFERENTIAL/PLATELET
BASOS ABS: 0 10*3/uL (ref 0–0.1)
Basophils Relative: 0 %
Eosinophils Absolute: 0 10*3/uL (ref 0–0.7)
Eosinophils Relative: 0 %
HEMATOCRIT: 33.5 % — AB (ref 40.0–52.0)
Hemoglobin: 11.6 g/dL — ABNORMAL LOW (ref 13.0–18.0)
LYMPHS ABS: 0.8 10*3/uL — AB (ref 1.0–3.6)
LYMPHS PCT: 11 %
MCH: 29.8 pg (ref 26.0–34.0)
MCHC: 34.5 g/dL (ref 32.0–36.0)
MCV: 86.3 fL (ref 80.0–100.0)
MONO ABS: 0.7 10*3/uL (ref 0.2–1.0)
Monocytes Relative: 9 %
NEUTROS ABS: 5.8 10*3/uL (ref 1.4–6.5)
Neutrophils Relative %: 80 %
Platelets: 302 10*3/uL (ref 150–440)
RBC: 3.88 MIL/uL — ABNORMAL LOW (ref 4.40–5.90)
RDW: 13.8 % (ref 11.5–14.5)
WBC: 7.2 10*3/uL (ref 3.8–10.6)

## 2016-07-13 LAB — TSH: TSH: 1.343 u[IU]/mL (ref 0.350–4.500)

## 2016-07-13 MED ORDER — HEPARIN SOD (PORK) LOCK FLUSH 100 UNIT/ML IV SOLN
500.0000 [IU] | Freq: Once | INTRAVENOUS | Status: AC | PRN
  Administered 2016-07-13: 500 [IU]
  Filled 2016-07-13: qty 5

## 2016-07-13 MED ORDER — SODIUM CHLORIDE 0.9 % IV SOLN
Freq: Once | INTRAVENOUS | Status: AC
  Administered 2016-07-13: 09:00:00 via INTRAVENOUS
  Filled 2016-07-13: qty 1000

## 2016-07-13 MED ORDER — SODIUM CHLORIDE 0.9 % IV SOLN
1200.0000 mg | Freq: Once | INTRAVENOUS | Status: AC
  Administered 2016-07-13: 1200 mg via INTRAVENOUS
  Filled 2016-07-13: qty 20

## 2016-07-13 NOTE — Progress Notes (Signed)
Patient here today for follow up  Patient/caregiver states no new concerns today

## 2016-07-13 NOTE — Assessment & Plan Note (Addendum)
#   SQUAMOUS Cell LUNG CA- STAGE IV- [ tumor nodule in the contralateral lung/ recurrence]; DEC 27th CT- STABLE. Currently on tecentriq. No concerns for clinical progression.   CT march 27th- Stable LUL lung nodules/ right stable radiation changes; new 5 mm nodule in RLL; and esophagitis changes [see discussion below]  # proceed with treatment today. Patient tolerating treatment well. Labs unremarkable- except for mild anemia Hb 12.3.TSH - Normal.   # Severe esophagitis; intermittently symptomatic; no weight loss- ? Radiation changes; referral to GI. Try prilosec once a day.   Discussed with pt/care giver.   # follow up in 3 weeks/labs/MD-infusion; and in 6 weeks.

## 2016-07-13 NOTE — Progress Notes (Signed)
[place] OFFICE PROGRESS NOTE  Patient Care Team: [name] as PCP - General (Internal Medicine)  [place] OFFICE PROGRESS NOTE  Patient Care Team: [name] as PCP - General (Internal Medicine)  Squamous cell carcinoma of right lung [facility])   Staging form: Lung, AJCC 7th Edition     Clinical stage from 07/17/2014: Stage IV (T4, N0, M1a) - Signed by Leia Alf, MD on 09/29/2014    Oncology History   # April 2016- SQUAMOUS CELL of LUNG ca [RUL] STAGE III; [EBUS-Right hilum];  (? Stage IV Left lung nodule- not Bx)   # FEB 2017-STAGE IV [contralateral lung nodules] PET Recurrence/Progression [high risk for [facility] 2017- START Tecentriq q 3W x3; April 2017 CT- LUL Stable/slight increase in size [~52m]; new lingula ~230m improved RUL radiation changes; Improved basilar nodules ? Atypical or infectious etilogy; JUne 12th CT-STABLE LUL.  # Schizophrenia/ group home resident     Malignant neoplasm of hilus of right lung ([facility]  10/28/2015 Initial Diagnosis    Malignant ne[place] of hilus of right lung ([place]       INTERVAL HISTORY: Poor historian given his schizophrenia. Accompanied by caregiver.  [age]ear old male patient with above history ofMetastatic squamous cell lung cancer is here for follow-up/ currently on Tecentriq; also to review the results of the restaging CAT scan.  Patient continues to have intermittent difficulty with swallowing especially with foods like bread. No weight loss. He never had prior endoscopies. No diarrhea. No shortness of breath or cough. No hemoptysis. No skin rash. No headaches or vision changes.  REVIEW OF SYSTEMS:  Difficult to assess given his poor history giving skills.  PAST MEDICAL HISTORY :  Past Medical History:  Diagnosis Date  . Chronic fatigue   . Chronic fatigue   . Colon polyps   . Hyperlipemia   . Hypertension   . Hypothyroidism   . Schizophrenia (HCP[place]  . Squamous cell carcinoma  of right lung (HCMay5/09/2014    PAST SURGICAL HISTORY :   Past Surgical History:  Procedure Laterality Date  . COLONOSCOPY     history of colon polyps  . ENDOBRONCHIAL ULTRASOUND  07/30/2014   right hilum FNA/EBUS  . PERIPHERAL VASCULAR CATHETERIZATION N/A 08/21/2014   Procedure: PoGlori Luisath Insertion;  Surgeon: JaAlgernon HuxleyMD;  [facility];  Service: Cardiovascular;  Laterality: N/A;    FAMILY HISTORY :  No family history on file.  SOCIAL HISTORY:   Social History  Substance Use Topics  . Smoking status: Former Smoker    Packs/day: 0.25    Years: 42.00    Types: Cigarettes  . Smokeless tobacco: Never Used  . Alcohol use No    ALLERGIES:  has No Known Allergies.  MEDICATIONS:  Current Outpatient Prescriptions  Medication Sig Dispense Refill  . atorvastatin (LIPITOR) 10 MG tablet Take 10 mg by mouth every morning.     . calcium carbonate (TUMS - DOSED IN MG ELEMENTAL CALCIUM) 500 MG chewable tablet Chew 1 tablet by mouth 2 (two) times daily.    . ferrous sulfate 325 (65 FE) MG tablet Take 325 mg by mouth daily with breakfast.    . levothyroxine (SYNTHROID, LEVOTHROID) 88 MCG tablet Take 88 mcg by mouth every morning.     . Marland Kitchenisinopril (PRINIVIL,ZESTRIL) 10 MG tablet Take 10 mg by mouth daily.    . Marland KitchenLANZapine (ZYPREXA) 20 MG tablet Take 20 mg by mouth at bedtime.     .Marland Kitchen  prochlorperazine (COMPAZINE) 10 MG tablet TAKE 1 TABLET BY MOUTH EVERY 6 HOURS AS NEEDED FOR NAUSEA & VOMITING 30 tablet 0  . propranolol (INDERAL) 10 MG tablet Take 10 mg by mouth 3 (three) times daily.    . sucralfate (CARAFATE) 1 g tablet Take 1 g by mouth 4 (four) times daily -  with meals and at bedtime.     No current facility-administered medications for this visit.    Facility-Administered Medications Ordered in Other Visits  Medication Dose Route Frequency Provider Last Rate Last Dose  . sodium chloride flush (NS) 0.9 % injection 10 mL  10 mL Intracatheter PRN Lloyd Huger, MD    10 mL at 02/10/16 0940    PHYSICAL EXAMINATION: ECOG PERFORMANCE STATUS: 0 - Asymptomatic  BP 118/77 (BP Location: Right Arm, Patient Position: Sitting)   Pulse 62   Temp (!) 96.3 F (35.7 C) (Tympanic)   Resp 16   Wt 198 lb 6 oz (90 kg)   BMI 26.90 kg/m   Filed Weights   07/13/16 0850  Weight: 198 lb 6 oz (90 kg)    GENERAL: Well-nourished well-developed; Alert, no distress and comfortable.  Accompanied by caregiver. EYES: no pallor or icterus OROPHARYNX: no thrush or ulceration; good dentition  NECK: supple, no masses felt LYMPH:  no palpable lymphadenopathy in the cervical, axillary or inguinal regions LUNGS: clear to auscultation and  No wheeze or crackles HEART/CVS: regular rate & rhythm and no murmurs; No lower extremity edema ABDOMEN:abdomen soft, non-tender and normal bowel sounds Musculoskeletal:no cyanosis of digits and no clubbing  PSYCH: alert & oriented x 3 with fluent speech NEURO: no focal motor/sensory deficits SKIN:  no rashes or significant lesions  LABORATORY DATA:  I have reviewed the data as listed    Component Value Date/Time   NA 137 07/13/2016 0828   NA 135 07/28/2014 1121   K 4.1 07/13/2016 0828   K 4.3 07/28/2014 1121   CL 105 07/13/2016 0828   CL 101 07/28/2014 1121   CO2 29 07/13/2016 0828   CO2 25 07/28/2014 1121   GLUCOSE 107 (H) 07/13/2016 0828   GLUCOSE 135 (H) 07/28/2014 1121   BUN 13 07/13/2016 0828   BUN 19 07/28/2014 1121   CREATININE 0.79 07/13/2016 0828   CREATININE 1.36 (H) 07/28/2014 1121   CALCIUM 9.2 07/13/2016 0828   CALCIUM 9.1 07/28/2014 1121   PROT 6.4 (L) 07/13/2016 0828   PROT 7.6 07/23/2014 1042   ALBUMIN 3.5 07/13/2016 0828   ALBUMIN 4.2 07/23/2014 1042   AST 19 07/13/2016 0828   AST 24 07/23/2014 1042   ALT 9 (L) 07/13/2016 0828   ALT 24 07/23/2014 1042   ALKPHOS 71 07/13/2016 0828   ALKPHOS 108 07/23/2014 1042   BILITOT 0.4 07/13/2016 0828   BILITOT 0.5 07/23/2014 1042   GFRNONAA >60 07/13/2016 0828    GFRNONAA 57 (L) 07/28/2014 1121   GFRAA >60 07/13/2016 0828   GFRAA >60 07/28/2014 1121    No results found for: SPEP, UPEP  Lab Results  Component Value Date   WBC 7.2 07/13/2016   NEUTROABS 5.8 07/13/2016   HGB 11.6 (L) 07/13/2016   HCT 33.5 (L) 07/13/2016   MCV 86.3 07/13/2016   PLT 302 07/13/2016      Chemistry      Component Value Date/Time   NA 137 07/13/2016 0828   NA 135 07/28/2014 1121   K 4.1 07/13/2016 0828   K 4.3 07/28/2014 1121   CL 105 07/13/2016 0828  CL 101 07/28/2014 1121   CO2 29 07/13/2016 0828   CO2 25 07/28/2014 1121   BUN 13 07/13/2016 0828   BUN 19 07/28/2014 1121   CREATININE 0.79 07/13/2016 0828   CREATININE 1.36 (H) 07/28/2014 1121      Component Value Date/Time   CALCIUM 9.2 07/13/2016 0828   CALCIUM 9.1 07/28/2014 1121   ALKPHOS 71 07/13/2016 0828   ALKPHOS 108 07/23/2014 1042   AST 19 07/13/2016 0828   AST 24 07/23/2014 1042   ALT 9 (L) 07/13/2016 0828   ALT 24 07/23/2014 1042   BILITOT 0.4 07/13/2016 0828   BILITOT 0.5 07/23/2014 1042     IMPRESSION: 1. New small nodule in the RIGHT lower lobe. Recommend attention on follow-up with differential including infection versus neoplasm. 2. Stable postsurgical and radiation change in the RIGHT upper lobe. 3. Stable angular nodules in the LEFT lung apex. 4. Diffuse esophageal wall thickening of the lower esophagus not changed comparison exam. Potential radiation esophagitis.   Electronically Signed   By: Suzy Bouchard M.D.   On: 07/12/2016 13:47 RADIOGRAPHIC STUDIES: I have personally reviewed the radiological images as listed and agreed with the findings in the report. Ct Chest W Contrast  Result Date: 07/12/2016 CLINICAL DATA:  Squamous cell carcinoma RIGHT lung diagnosis 2016. Chemotherapy ongoing. Restaging Squamous Cell CA of Right Lung diagnosed 07/2014. Pt c/o slight cough. He is currently receiving Tecentriq . NKI. Hx Port-a-cath. Former smoker, quit 2-3 years ago. ^  EXAM: [place]: Multidetector CT imaging of the chest was performed during intravenous contrast administration. CONTRAST:  31m ISOVUE-300 IOPAMIDOL (ISOVUE-300) INJECTION 61% COMPARISON:  CT 04/13/2016 FINDINGS: Cardiovascular: Coronary artery calcification and aortic atherosclerotic calcification. Mediastinum/Nodes: No axillary or supraclavicular adenopathy. No mediastinal hilar adenopathy. Diffuse esophageal wall thickening most severe from the level of the carina to the GE junction. This is similar to comparison exam. No evidence of high-grade obstruction. Small amount fluid in the upper esophagus. Lungs/Pleura: Two angular nodules at the LEFT lung apex are unchanged in size measuring 12 mm and 8 mm on image 31, series 3. A 5 mm nodule in the RIGTH lower lobe (image 122, series 3) is new from prior. There is perihilar angular consolidation with air bronchograms in the RIGHT upper lobe not changed from prior Upper Abdomen: Low-density lesion in the LEFT hepatic lobe is not changed. Adrenal glands normal. No upper abdominal adenopathy. Musculoskeletal: No aggressive osseous lesion. IMPRESSION: 1. New small nodule in the RIGHT lower lobe. Recommend attention on follow-up with differential including infection versus neoplasm. 2. Stable postsurgical and radiation change in the RIGHT upper lobe. 3. Stable angular nodules in the LEFT lung apex. 4. Diffuse esophageal wall thickening of the lower esophagus not changed comparison exam. Potential radiation esophagitis. Electronically Signed   By: SSuzy BouchardM.D.   On: 07/12/2016 13:47     ASSESSMENT & PLAN:  Malignant neoplasm of hilus of right lung ([place]ure[place] # SQUAMOUS Cell LUNG CA- STAGE IV- [ tumor nodule in the contralateral lung/ recurrence]; DEC 27th CT- STABLE. Currently on tecentriq. No concerns for clinical progression.   CT march 27th- Stable LUL lung nodules/ right stable radiation changes; new 5 mm nodule in RLL; and esophagitis  changes [see discussion below]  # proceed with treatment today. Patient tolerating treatment well. Labs unremarkable- except for mild anemia Hb 12.3.TSH - Normal.   # Severe esophagitis; intermittently symptomatic; no weight loss- ? Radiation changes; referral to GI. Try prilosec once a day.  Discussed with pt/care giver.   # follow up in 3 weeks/labs/MD-infusion; and in 6 weeks.    Orders Placed This Encounter  Procedures  . Ambulatory referral to Gastroenterology    Referral Priority:   Routine    Referral Type:   Consultation    Referral Reason:   Specialty Services Required    Referred to Provider:   Jonathon Bellows, MD    Number of Visits Requested:   1   All questions were answered. The patient knows to call the clinic with any problems, questions or concerns.      [name], MD 07/13/2016 1:31 PM       [name], MD 07/13/2016 1:31 PM

## 2016-07-13 NOTE — Telephone Encounter (Signed)
Pt needed to reschedule appt as there was a conflict in his appt schedule.

## 2016-07-13 NOTE — Telephone Encounter (Signed)
Travis Maddox called and needs to r/s egd due to an appt at cancer center. Please call at [phone].

## 2016-07-26 ENCOUNTER — Other Ambulatory Visit: Payer: Self-pay | Admitting: Internal Medicine

## 2016-07-26 DIAGNOSIS — R112 Nausea with vomiting, unspecified: Secondary | ICD-10-CM

## 2016-07-26 DIAGNOSIS — C3491 Malignant neoplasm of unspecified part of right bronchus or lung: Secondary | ICD-10-CM

## 2016-08-03 ENCOUNTER — Inpatient Hospital Stay: Payer: Medicare Other | Attending: Internal Medicine | Admitting: Internal Medicine

## 2016-08-03 ENCOUNTER — Inpatient Hospital Stay: Payer: Medicare Other

## 2016-08-03 VITALS — BP 120/77 | HR 69 | Temp 97.9°F | Resp 20 | Ht 72.0 in | Wt 197.0 lb

## 2016-08-03 DIAGNOSIS — Y842 Radiological procedure and radiotherapy as the cause of abnormal reaction of the patient, or of later complication, without mention of misadventure at the time of the procedure: Secondary | ICD-10-CM | POA: Insufficient documentation

## 2016-08-03 DIAGNOSIS — E039 Hypothyroidism, unspecified: Secondary | ICD-10-CM | POA: Insufficient documentation

## 2016-08-03 DIAGNOSIS — C3401 Malignant neoplasm of right main bronchus: Secondary | ICD-10-CM | POA: Insufficient documentation

## 2016-08-03 DIAGNOSIS — Z5112 Encounter for antineoplastic immunotherapy: Secondary | ICD-10-CM | POA: Diagnosis not present

## 2016-08-03 DIAGNOSIS — Z79899 Other long term (current) drug therapy: Secondary | ICD-10-CM | POA: Insufficient documentation

## 2016-08-03 DIAGNOSIS — K208 Other esophagitis: Secondary | ICD-10-CM | POA: Diagnosis not present

## 2016-08-03 DIAGNOSIS — E785 Hyperlipidemia, unspecified: Secondary | ICD-10-CM | POA: Insufficient documentation

## 2016-08-03 DIAGNOSIS — R911 Solitary pulmonary nodule: Secondary | ICD-10-CM

## 2016-08-03 DIAGNOSIS — R1310 Dysphagia, unspecified: Secondary | ICD-10-CM | POA: Insufficient documentation

## 2016-08-03 DIAGNOSIS — I10 Essential (primary) hypertension: Secondary | ICD-10-CM | POA: Diagnosis not present

## 2016-08-03 DIAGNOSIS — F209 Schizophrenia, unspecified: Secondary | ICD-10-CM | POA: Insufficient documentation

## 2016-08-03 DIAGNOSIS — Z87891 Personal history of nicotine dependence: Secondary | ICD-10-CM | POA: Insufficient documentation

## 2016-08-03 DIAGNOSIS — Z86010 Personal history of colonic polyps: Secondary | ICD-10-CM | POA: Diagnosis not present

## 2016-08-03 DIAGNOSIS — C3491 Malignant neoplasm of unspecified part of right bronchus or lung: Secondary | ICD-10-CM

## 2016-08-03 DIAGNOSIS — D649 Anemia, unspecified: Secondary | ICD-10-CM | POA: Insufficient documentation

## 2016-08-03 LAB — COMPREHENSIVE METABOLIC PANEL
ALBUMIN: 3.5 g/dL (ref 3.5–5.0)
ALK PHOS: 92 U/L (ref 38–126)
ALT: 9 U/L — AB (ref 17–63)
AST: 16 U/L (ref 15–41)
Anion gap: 5 (ref 5–15)
BUN: 19 mg/dL (ref 6–20)
CALCIUM: 8.9 mg/dL (ref 8.9–10.3)
CHLORIDE: 103 mmol/L (ref 101–111)
CO2: 26 mmol/L (ref 22–32)
Creatinine, Ser: 0.79 mg/dL (ref 0.61–1.24)
GFR calc Af Amer: >60 mL/min (ref >60)
GFR calc non Af Amer: >60 mL/min (ref >60)
GLUCOSE: 91 mg/dL (ref 65–99)
Potassium: 4.2 mmol/L (ref 3.5–5.1)
SODIUM: 134 mmol/L — AB (ref 135–145)
TOTAL PROTEIN: 6.3 g/dL — AB (ref 6.5–8.1)
Total Bilirubin: 0.4 mg/dL (ref 0.3–1.2)

## 2016-08-03 LAB — CBC WITH DIFFERENTIAL/PLATELET
BASOS ABS: 0 10*3/uL (ref 0–0.1)
BASOS PCT: 0 %
EOS ABS: 0 10*3/uL (ref 0–0.7)
Eosinophils Relative: 0 %
HCT: 32.9 % — ABNORMAL LOW (ref 40.0–52.0)
Hemoglobin: 11.5 g/dL — ABNORMAL LOW (ref 13.0–18.0)
Lymphocytes Relative: 16 %
Lymphs Abs: 1 10*3/uL (ref 1.0–3.6)
MCH: 29.9 pg (ref 26.0–34.0)
MCHC: 34.9 g/dL (ref 32.0–36.0)
MCV: 85.6 fL (ref 80.0–100.0)
Monocytes Absolute: 0.8 10*3/uL (ref 0.2–1.0)
Monocytes Relative: 13 %
NEUTROS PCT: 71 %
Neutro Abs: 4.5 10*3/uL (ref 1.4–6.5)
PLATELETS: 334 10*3/uL (ref 150–440)
RBC: 3.84 MIL/uL — AB (ref 4.40–5.90)
RDW: 13.4 % (ref 11.5–14.5)
WBC: 6.3 10*3/uL (ref 3.8–10.6)

## 2016-08-03 MED ORDER — SODIUM CHLORIDE 0.9 % IV SOLN
1200.0000 mg | Freq: Once | INTRAVENOUS | Status: AC
  Administered 2016-08-03: 1200 mg via INTRAVENOUS
  Filled 2016-08-03: qty 20

## 2016-08-03 MED ORDER — HEPARIN SOD (PORK) LOCK FLUSH 100 UNIT/ML IV SOLN
500.0000 [IU] | Freq: Once | INTRAVENOUS | Status: AC | PRN
  Administered 2016-08-03: 500 [IU]
  Filled 2016-08-03: qty 5

## 2016-08-03 MED ORDER — ONDANSETRON 8 MG PO TBDP
8.0000 mg | ORAL_TABLET | Freq: Once | ORAL | Status: AC
  Administered 2016-08-03: 8 mg via ORAL
  Filled 2016-08-03: qty 1

## 2016-08-03 MED ORDER — SODIUM CHLORIDE 0.9 % IV SOLN
Freq: Once | INTRAVENOUS | Status: AC
  Administered 2016-08-03: 10:00:00 via INTRAVENOUS
  Filled 2016-08-03: qty 1000

## 2016-08-03 NOTE — Assessment & Plan Note (Addendum)
#   SQUAMOUS Cell LUNG CA- STAGE IV- [ tumor nodule in the contralateral lung/ recurrence]; DEC 27th CT- STABLE. Currently on tecentriq.   # No concerns for clinical progression.   CT march 27th- Stable LUL lung nodules/ right stable radiation changes; new 5 mm nodule in RLL; and esophagitis changes [see discussion below]  # proceed with treatment today. Patient tolerating treatment well. Labs unremarkable- except for mild anemia Hb 11.5. March 30th TSH - Normal.   # Severe esophagitis; intermittently symptomatic; no weight loss- ? Radiation changes; awaiting GI next week.  Currently on prilosec once a day.   Discussed with pt/care giver.   # follow up in 3 weeks/labs/MD-infusion; and in 6 weeks.

## 2016-08-03 NOTE — Progress Notes (Signed)
[place] OFFICE PROGRESS NOTE  Patient Care Team: [name] as PCP - General (Internal Medicine)  [place] OFFICE PROGRESS NOTE  Patient Care Team: [name] as PCP - General (Internal Medicine)  Squamous cell carcinoma of right lung [facility])   Staging form: Lung, AJCC 7th Edition     Clinical stage from 07/17/2014: Stage IV (T4, N0, M1a) - Signed by Leia Alf, MD on 09/29/2014    Oncology History   # April 2016- SQUAMOUS CELL of LUNG ca [RUL] STAGE III; [EBUS-Right hilum];  (? Stage IV Left lung nodule- not Bx)   # FEB 2017-STAGE IV [contralateral lung nodules] PET Recurrence/Progression [high risk for [facility] 2017- START Tecentriq q 3W x3; April 2017 CT- LUL Stable/slight increase in size [~54m]; new lingula ~232m improved RUL radiation changes; Improved basilar nodules ? Atypical or infectious etilogy; JUne 12th CT-STABLE LUL.  # Schizophrenia/ group home resident     Malignant neoplasm of hilus of right lung (HCLake [place]  10/28/2015 Initial Diagnosis    Malignant neoplasm of hilus of right lung (HCWilderness [place]       INTERVAL HISTORY: Poor historian given his schizophrenia. Accompanied by caregiver.  [age]ear old male patient with above history ofMetastatic squamous cell lung cancer is here for follow-up/ currently on Tecentriq- Is here for follow-up.  He continues to have difficulty with swallowing intermittently to foods like bread. He is awaiting a GI evaluation next week.  He never had prior endoscopies. No diarrhea. No shortness of breath or cough. No hemoptysis. No skin rash. No headaches or vision changes. No diarrhea.  REVIEW OF SYSTEMS:  Difficult to assess given his poor history giving skills.  PAST MEDICAL HISTORY :  Past Medical History:  Diagnosis Date  . Chronic fatigue   . Chronic fatigue   . Colon polyps   . Hyperlipemia   . Hypertension   . Hypothyroidism   . Schizophrenia (HCJersey [place]  . Squamous cell carcinoma of  right lung (HCOnley5/09/2014    PAST SURGICAL HISTORY :   Past Surgical History:  Procedure Laterality Date  . COLONOSCOPY     history of colon polyps  . ENDOBRONCHIAL ULTRASOUND  07/30/2014   right hilum FNA/EBUS  . PERIPHERAL VASCULAR CATHETERIZATION N/A 08/21/2014   Procedure: PoGlori Luisath Insertion;  Surgeon: JaAlgernon HuxleyMD;  [facility];  Service: Cardiovascular;  Laterality: N/A;    FAMILY HISTORY :  No family history on file.  SOCIAL HISTORY:   Social History  Substance Use Topics  . Smoking status: Former Smoker    Packs/day: 0.25    Years: 42.00    Types: Cigarettes  . Smokeless tobacco: Never Used  . Alcohol use No    ALLERGIES:  has No Known Allergies.  MEDICATIONS:  Current Outpatient Prescriptions  Medication Sig Dispense Refill  . atorvastatin (LIPITOR) 10 MG tablet Take 10 mg by mouth every morning.     . calcium carbonate (TUMS - DOSED IN MG ELEMENTAL CALCIUM) 500 MG chewable tablet Chew 1 tablet by mouth 2 (two) times daily.    . ferrous sulfate 325 (65 FE) MG tablet Take 325 mg by mouth daily with breakfast.    . levothyroxine (SYNTHROID, LEVOTHROID) 88 MCG tablet Take 88 mcg by mouth every morning.     . Marland Kitchenisinopril (PRINIVIL,ZESTRIL) 10 MG tablet Take 10 mg by mouth daily.    . Marland KitchenLANZapine (ZYPREXA) 20 MG tablet Take 20 mg by mouth at bedtime.     .Marland Kitchen  propranolol (INDERAL) 10 MG tablet Take 10 mg by mouth 3 (three) times daily.    . sucralfate (CARAFATE) 1 g tablet Take 1 g by mouth 4 (four) times daily -  with meals and at bedtime.    . prochlorperazine (COMPAZINE) 10 MG tablet TAKE 1 TABLET BY MOUTH EVERY 6 HOURS AS NEEDED FOR NAUSEA & VOMITING (Patient not taking: Reported on 08/03/2016) 30 tablet 0   No current facility-administered medications for this visit.    Facility-Administered Medications Ordered in Other Visits  Medication Dose Route Frequency Provider Last Rate Last Dose  . sodium chloride flush (NS) 0.9 % injection 10 mL  10 mL  Intracatheter PRN Lloyd Huger, MD   10 mL at 02/10/16 0940    PHYSICAL EXAMINATION: ECOG PERFORMANCE STATUS: 0 - Asymptomatic  BP 120/77 (BP Location: Left Arm, Patient Position: Sitting)   Pulse 69   Temp 97.9 F (36.6 C) (Tympanic)   Resp 20   Ht 6' (1.829 m)   Wt 197 lb (89.4 kg)   BMI 26.72 kg/m   Filed Weights   08/03/16 0841  Weight: 197 lb (89.4 kg)    GENERAL: Well-nourished well-developed; Alert, no distress and comfortable.  Accompanied by caregiver. EYES: no pallor or icterus OROPHARYNX: no thrush or ulceration; good dentition  NECK: supple, no masses felt LYMPH:  no palpable lymphadenopathy in the cervical, axillary or inguinal regions LUNGS: clear to auscultation and  No wheeze or crackles HEART/CVS: regular rate & rhythm and no murmurs; No lower extremity edema ABDOMEN:abdomen soft, non-tender and normal bowel sounds Musculoskeletal:no cyanosis of digits and no clubbing  PSYCH: alert & oriented x 3 with fluent speech NEURO: no focal motor/sensory deficits SKIN:  no rashes or significant lesions  LABORATORY DATA:  I have reviewed the data as listed    Component Value Date/Time   NA 134 (L) 08/03/2016 0814   NA 135 07/28/2014 1121   K 4.2 08/03/2016 0814   K 4.3 07/28/2014 1121   CL 103 08/03/2016 0814   CL 101 07/28/2014 1121   CO2 26 08/03/2016 0814   CO2 25 07/28/2014 1121   GLUCOSE 91 08/03/2016 0814   GLUCOSE 135 (H) 07/28/2014 1121   BUN 19 08/03/2016 0814   BUN 19 07/28/2014 1121   CREATININE 0.79 08/03/2016 0814   CREATININE 1.36 (H) 07/28/2014 1121   CALCIUM 8.9 08/03/2016 0814   CALCIUM 9.1 07/28/2014 1121   PROT 6.3 (L) 08/03/2016 0814   PROT 7.6 07/23/2014 1042   ALBUMIN 3.5 08/03/2016 0814   ALBUMIN 4.2 07/23/2014 1042   AST 16 08/03/2016 0814   AST 24 07/23/2014 1042   ALT 9 (L) 08/03/2016 0814   ALT 24 07/23/2014 1042   ALKPHOS 92 08/03/2016 0814   ALKPHOS 108 07/23/2014 1042   BILITOT 0.4 08/03/2016 0814   BILITOT 0.5  07/23/2014 1042   GFRNONAA >60 08/03/2016 0814   GFRNONAA 57 (L) 07/28/2014 1121   GFRAA >60 08/03/2016 0814   GFRAA >60 07/28/2014 1121    No results found for: SPEP, UPEP  Lab Results  Component Value Date   WBC 6.3 08/03/2016   NEUTROABS 4.5 08/03/2016   HGB 11.5 (L) 08/03/2016   HCT 32.9 (L) 08/03/2016   MCV 85.6 08/03/2016   PLT 334 08/03/2016      Chemistry      Component Value Date/Time   NA 134 (L) 08/03/2016 0814   NA 135 07/28/2014 1121   K 4.2 08/03/2016 0814   K 4.3  07/28/2014 1121   CL 103 08/03/2016 0814   CL 101 07/28/2014 1121   CO2 26 08/03/2016 0814   CO2 25 07/28/2014 1121   BUN 19 08/03/2016 0814   BUN 19 07/28/2014 1121   CREATININE 0.79 08/03/2016 0814   CREATININE 1.36 (H) 07/28/2014 1121      Component Value Date/Time   CALCIUM 8.9 08/03/2016 0814   CALCIUM 9.1 07/28/2014 1121   ALKPHOS 92 08/03/2016 0814   ALKPHOS 108 07/23/2014 1042   AST 16 08/03/2016 0814   AST 24 07/23/2014 1042   ALT 9 (L) 08/03/2016 0814   ALT 24 07/23/2014 1042   BILITOT 0.4 08/03/2016 0814   BILITOT 0.5 07/23/2014 1042     IMPRESSION: 1. New small nodule in the RIGHT lower lobe. Recommend attention on follow-up with differential including infection versus neoplasm. 2. Stable postsurgical and radiation change in the RIGHT upper lobe. 3. Stable angular nodules in the LEFT lung apex. 4. Diffuse esophageal wall thickening of the lower esophagus not changed comparison exam. Potential radiation esophagitis.   Electronically Signed   By: Suzy Bouchard M.D.   On: 07/12/2016 13:47 RADIOGRAPHIC STUDIES: I have personally reviewed the radiological images as listed and agreed with the findings in the report. No results found.   ASSESSMENT & PLAN:  Malignant neoplasm of hilus of right lung ([place]) # SQUAMOUS Cell LUNG CA- STAGE IV- [ tumor nodule in the contralateral lung/ recurrence]; DEC 27th CT- STABLE. Currently on tecentriq.   # No concerns for clinical  progression.   CT march 27th- Stable LUL lung nodules/ right stable radiation changes; new 5 mm nodule in RLL; and esophagitis changes [see discussion below]  # proceed with treatment today. Patient tolerating treatment well. Labs unremarkable- except for mild anemia Hb 11.5. March 30th TSH - Normal.   # Severe esophagitis; intermittently symptomatic; no weight loss- ? Radiation changes; awaiting GI next week.  Currently on prilosec once a day.   Discussed with pt/care giver.   # follow up in 3 weeks/labs/MD-infusion; and in 6 weeks.    Orders Placed This Encounter  Procedures  . CBC with Differential    Standing Status:   Standing    Number of Occurrences:   20    Standing Expiration Date:   08/03/2017  . Comprehensive metabolic panel    Standing Status:   Standing    Number of Occurrences:   20    Standing Expiration Date:   08/03/2017   All questions were answered. The patient knows to call the clinic with any problems, questions or concerns.      [name], MD 08/03/2016 9:19 AM       [name], MD 08/03/2016 9:19 AM

## 2016-08-03 NOTE — Progress Notes (Signed)
Patient here for followup for lung ca and tencentriq. He has no medical complaints today.

## 2016-08-10 ENCOUNTER — Telehealth: Payer: Self-pay | Admitting: Gastroenterology

## 2016-08-10 ENCOUNTER — Ambulatory Visit: Payer: Medicare Other | Admitting: Gastroenterology

## 2016-08-10 ENCOUNTER — Encounter: Payer: Self-pay | Admitting: *Deleted

## 2016-08-10 ENCOUNTER — Ambulatory Visit (INDEPENDENT_AMBULATORY_CARE_PROVIDER_SITE_OTHER): Payer: Medicare Other | Admitting: Gastroenterology

## 2016-08-10 ENCOUNTER — Encounter: Payer: Self-pay | Admitting: Gastroenterology

## 2016-08-10 ENCOUNTER — Other Ambulatory Visit: Payer: Self-pay

## 2016-08-10 VITALS — BP 118/74 | HR 76 | Temp 97.7°F | Ht 72.0 in | Wt 195.0 lb

## 2016-08-10 DIAGNOSIS — R1310 Dysphagia, unspecified: Secondary | ICD-10-CM | POA: Diagnosis not present

## 2016-08-10 DIAGNOSIS — R1319 Other dysphagia: Secondary | ICD-10-CM

## 2016-08-10 NOTE — Telephone Encounter (Signed)
08/10/16 NO prior auth required for EDG for MCR/MCD.

## 2016-08-10 NOTE — Progress Notes (Signed)
Gastroenterology Consultation  Referring Provider:     [name] Primary Care Physician:  [name] Primary Gastroenterologist:  Dr. Allen Norris     Reason for Consultation:     Dysphagia        HPI:   Travis Maddox is a 61 y.o. y/o male referred for consultation & management of Dysphagia by Dr. [name].  This patient comes today with a history of dysphagia. The patient has a history of squamous cell carcinoma of the right lung. The squamous cell carcinoma of the lung is being followed by oncology the patient is presently receiving treatment. The patient has a history of severe esophagitis as per oncology.  Past Medical History:  Diagnosis Date  . Chronic fatigue   . Chronic fatigue   . Colon polyps   . Hyperlipemia   . Hypertension   . Hypothyroidism   . Schizophrenia ([place])   . Squamous cell carcinoma of right lung ([place]) 08/22/2014    Past Surgical History:  Procedure Laterality Date  . COLONOSCOPY     history of colon polyps  . ENDOBRONCHIAL ULTRASOUND  07/30/2014   right hilum FNA/EBUS  . PERIPHERAL VASCULAR CATHETERIZATION N/A 08/21/2014   Procedure: Glori Luis Cath Insertion;  Surgeon: Algernon Huxley, MD;  Location: [facility];  Service: Cardiovascular;  Laterality: N/A;    Prior to Admission medications   Medication Sig Start Date End Date Taking? Authorizing Provider  atorvastatin (LIPITOR) 10 MG tablet Take 10 mg by mouth every morning.     Historical Provider, MD  calcium carbonate (TUMS - DOSED IN MG ELEMENTAL CALCIUM) 500 MG chewable tablet Chew 1 tablet by mouth 2 (two) times daily.    Historical Provider, MD  ferrous sulfate 325 (65 FE) MG tablet Take 325 mg by mouth daily with breakfast.    Historical Provider, MD  levothyroxine (SYNTHROID, LEVOTHROID) 88 MCG tablet Take 88 mcg by mouth every morning.     Historical Provider, MD  lisinopril (PRINIVIL,ZESTRIL) 10 MG tablet Take 10 mg by mouth daily.    Historical Provider, MD  OLANZapine (ZYPREXA) 20 MG  tablet Take 20 mg by mouth at bedtime.     Historical Provider, MD  prochlorperazine (COMPAZINE) 10 MG tablet TAKE 1 TABLET BY MOUTH EVERY 6 HOURS AS NEEDED FOR NAUSEA & VOMITING Patient not taking: Reported on 08/03/2016 07/26/16   [name], MD  propranolol (INDERAL) 10 MG tablet Take 10 mg by mouth 3 (three) times daily.    Historical Provider, MD  sucralfate (CARAFATE) 1 g tablet Take 1 g by mouth 4 (four) times daily -  with meals and at bedtime.    Historical Provider, MD    No family history on file.   Social History  Substance Use Topics  . Smoking status: Former Smoker    Packs/day: 0.25    Years: 42.00    Types: Cigarettes  . Smokeless tobacco: Never Used  . Alcohol use No    Allergies as of 08/10/2016  . (No Known Allergies)    Review of Systems:    All systems reviewed and negative except where noted in HPI.   Physical Exam:  There were no vitals taken for this visit. No LMP for male patient. Psych:  Alert and cooperative. Patient with schizophrenia. General:   Alert,  Well-developed, well-nourished, pleasant and cooperative in NAD Head:  Normocephalic and atraumatic. Eyes:  Sclera clear, no icterus.   Conjunctiva pink. Ears:  Normal auditory acuity. Nose:  No deformity, discharge, or lesions. Mouth:  No deformity or lesions,oropharynx pink & moist. Neck:  Supple; no masses or thyromegaly. Lungs:  Respirations even and unlabored.  Clear throughout to auscultation.   No wheezes, crackles, or rhonchi. No acute distress. Heart:  Regular rate and rhythm; no murmurs, clicks, rubs, or gallops. Abdomen:  Normal bowel sounds.  No bruits.  Soft, non-tender and non-distended without masses, hepatosplenomegaly or hernias noted.  No guarding or rebound tenderness.  Negative Carnett sign.   Rectal:  Deferred.  Msk:  Symmetrical without gross deformities.  Good, equal movement & strength bilaterally. Pulses:  Normal pulses noted. Extremities:  No clubbing or edema.   No cyanosis. Neurologic:  Alert. Patient with schizophrenia  grossly normal neurologically. Skin:  Intact without significant lesions or rashes.  No jaundice. Lymph Nodes:  No significant cervical adenopathy. Psych:  Alert and cooperative. Normal mood and affect.  Imaging Studies: Ct Chest W Contrast  Result Date: 07/12/2016 CLINICAL DATA:  Squamous cell carcinoma RIGHT lung diagnosis 2016. Chemotherapy ongoing. Restaging Squamous Cell CA of Right Lung diagnosed 07/2014. Pt c/o slight cough. He is currently receiving Tecentriq . NKI. Hx Port-a-cath. Former smoker, quit 2-3 years ago. ^ EXAM: [place]: Multidetector CT imaging of the chest was performed during intravenous contrast administration. CONTRAST:  30m ISOVUE-300 IOPAMIDOL (ISOVUE-300) INJECTION 61% COMPARISON:  CT 04/13/2016 FINDINGS: Cardiovascular: Coronary artery calcification and aortic atherosclerotic calcification. Mediastinum/Nodes: No axillary or supraclavicular adenopathy. No mediastinal hilar adenopathy. Diffuse esophageal wall thickening most severe from the level of the carina to the GE junction. This is similar to comparison exam. No evidence of high-grade obstruction. Small amount fluid in the upper esophagus. Lungs/Pleura: Two angular nodules at the LEFT lung apex are unchanged in size measuring 12 mm and 8 mm on image 31, series 3. A 5 mm nodule in the RIGTH lower lobe (image 122, series 3) is new from prior. There is perihilar angular consolidation with air bronchograms in the RIGHT upper lobe not changed from prior Upper Abdomen: Low-density lesion in the LEFT hepatic lobe is not changed. Adrenal glands normal. No upper abdominal adenopathy. Musculoskeletal: No aggressive osseous lesion. IMPRESSION: 1. New small nodule in the RIGHT lower lobe. Recommend attention on follow-up with differential including infection versus neoplasm. 2. Stable postsurgical and radiation change in the RIGHT upper lobe. 3. Stable  angular nodules in the LEFT lung apex. 4. Diffuse esophageal wall thickening of the lower esophagus not changed comparison exam. Potential radiation esophagitis. Electronically Signed   By: SSuzy BouchardM.D.   On: 07/12/2016 13:47    Assessment and Plan:   JTRAMPAS STETTNERis a 61y.o. y/o male who has a history of radiation to the chest and dysphagia to solids. The patient will be set up for an EGD to look for any esophageal strictures or any other cause for his dysphagia. I have discussed risks & benefits which include, but are not limited to, bleeding, infection, perforation & drug reaction.  The patient agrees with this plan & written consent will be obtained.       DLucilla Lame MD. FMarval Regal  Note: This dictation was prepared with Dragon dictation along with smaller phrase technology. Any transcriptional errors that result from this process are unintentional.

## 2016-08-12 NOTE — Discharge Instructions (Signed)

## 2016-08-15 ENCOUNTER — Ambulatory Visit: Payer: Medicare Other | Admitting: Anesthesiology

## 2016-08-15 ENCOUNTER — Encounter
Admission: RE | Disposition: A | Discharge status: Home / Self Care | Payer: Self-pay | Source: Ambulatory Visit | Attending: Gastroenterology

## 2016-08-15 ENCOUNTER — Ambulatory Visit
Admission: RE | Admit: 2016-08-15 | Discharge: 2016-08-15 | Disposition: A | Discharge status: Home / Self Care | Payer: Medicare Other | Source: Ambulatory Visit | Attending: Gastroenterology | Admitting: Gastroenterology

## 2016-08-15 DIAGNOSIS — K2210 Ulcer of esophagus without bleeding: Secondary | ICD-10-CM

## 2016-08-15 DIAGNOSIS — C3491 Malignant neoplasm of unspecified part of right bronchus or lung: Secondary | ICD-10-CM | POA: Insufficient documentation

## 2016-08-15 DIAGNOSIS — R5382 Chronic fatigue, unspecified: Secondary | ICD-10-CM | POA: Diagnosis not present

## 2016-08-15 DIAGNOSIS — Z79899 Other long term (current) drug therapy: Secondary | ICD-10-CM | POA: Diagnosis not present

## 2016-08-15 DIAGNOSIS — E039 Hypothyroidism, unspecified: Secondary | ICD-10-CM | POA: Insufficient documentation

## 2016-08-15 DIAGNOSIS — K449 Diaphragmatic hernia without obstruction or gangrene: Secondary | ICD-10-CM | POA: Diagnosis not present

## 2016-08-15 DIAGNOSIS — Z923 Personal history of irradiation: Secondary | ICD-10-CM | POA: Insufficient documentation

## 2016-08-15 DIAGNOSIS — Z86010 Personal history of colonic polyps: Secondary | ICD-10-CM | POA: Diagnosis not present

## 2016-08-15 DIAGNOSIS — F209 Schizophrenia, unspecified: Secondary | ICD-10-CM | POA: Insufficient documentation

## 2016-08-15 DIAGNOSIS — R1310 Dysphagia, unspecified: Secondary | ICD-10-CM | POA: Diagnosis present

## 2016-08-15 DIAGNOSIS — I10 Essential (primary) hypertension: Secondary | ICD-10-CM | POA: Insufficient documentation

## 2016-08-15 DIAGNOSIS — K2970 Gastritis, unspecified, without bleeding: Secondary | ICD-10-CM

## 2016-08-15 DIAGNOSIS — E785 Hyperlipidemia, unspecified: Secondary | ICD-10-CM | POA: Diagnosis not present

## 2016-08-15 DIAGNOSIS — Z87891 Personal history of nicotine dependence: Secondary | ICD-10-CM | POA: Insufficient documentation

## 2016-08-15 HISTORY — DX: Presence of dental prosthetic device (complete) (partial): Z97.2

## 2016-08-15 HISTORY — PX: ESOPHAGOGASTRODUODENOSCOPY (EGD) WITH PROPOFOL: SHX5813

## 2016-08-15 SURGERY — ESOPHAGOGASTRODUODENOSCOPY (EGD) WITH PROPOFOL
Anesthesia: Monitor Anesthesia Care | Wound class: Clean Contaminated

## 2016-08-15 MED ORDER — PROPOFOL 10 MG/ML IV BOLUS
INTRAVENOUS | Status: DC | PRN
  Administered 2016-08-15 (×3): 50 mg via INTRAVENOUS

## 2016-08-15 MED ORDER — LACTATED RINGERS IV SOLN
1000.0000 mL | INTRAVENOUS | Status: DC
  Administered 2016-08-15: 1000 mL via INTRAVENOUS

## 2016-08-15 MED ORDER — PANTOPRAZOLE SODIUM 40 MG PO TBEC: 40.0000 mg | DELAYED_RELEASE_TABLET | Freq: Every day | ORAL | 11 refills | Status: AC

## 2016-08-15 MED ORDER — GLYCOPYRROLATE 0.2 MG/ML IJ SOLN
INTRAMUSCULAR | Status: DC | PRN
  Administered 2016-08-15: 0.2 mg via INTRAVENOUS

## 2016-08-15 MED ORDER — LIDOCAINE HCL (CARDIAC) 20 MG/ML IV SOLN
INTRAVENOUS | Status: DC | PRN
  Administered 2016-08-15: 50 mg via INTRAVENOUS

## 2016-08-15 SURGICAL SUPPLY — 32 items

## 2016-08-15 NOTE — Anesthesia Procedure Notes (Signed)
Procedure Name: MAC Date/Time: 08/15/2016 10:01 AM Performed by: Janna Arch Pre-anesthesia Checklist: Patient identified, Emergency Drugs available, Suction available and Patient being monitored Patient Re-evaluated:Patient Re-evaluated prior to inductionOxygen Delivery Method: Nasal cannula

## 2016-08-15 NOTE — Interval H&P Note (Signed)
History and Physical Interval Note:  08/15/2016 9:20 AM  Travis Maddox  has presented today for surgery, with the diagnosis of Esophageal dysphagia R13.10  The various methods of treatment have been discussed with the patient and family. After consideration of risks, benefits and other options for treatment, the patient has consented to  Procedure(s): ESOPHAGOGASTRODUODENOSCOPY (EGD) WITH PROPOFOL (N/A) as a surgical intervention .  The patient's history has been reviewed, patient examined, no change in status, stable for surgery.  I have reviewed the patient's chart and labs.  Questions were answered to the patient's satisfaction.     Perry Brucato [employer]

## 2016-08-15 NOTE — Op Note (Signed)
[facility] Gastroenterology Patient Name: Travis Maddox Procedure Date: 08/15/2016 9:58 AM MRN: [id number] Account #: [id number] Date of Birth: Nov 26, 1955 Admit Type: Outpatient Age: 61 Room: [facility] OR ROOM 01 Gender: Male Note Status: Finalized Procedure:            Upper GI endoscopy Indications:          Dysphagia Providers:            [name] MD, MD Referring MD:         [name], MD (Referring MD) Medicines:            Propofol per Anesthesia Complications:        No immediate complications. Procedure:            Pre-Anesthesia Assessment:                       - Prior to the procedure, a History and Physical was                        performed, and patient medications and allergies were                        reviewed. The patient's tolerance of previous                        anesthesia was also reviewed. The risks and benefits of                        the procedure and the sedation options and risks were                        discussed with the patient. All questions were                        answered, and informed consent was obtained. Prior                        Anticoagulants: The patient has taken no previous                        anticoagulant or antiplatelet agents. ASA Grade                        Assessment: II - A patient with mild systemic disease.                        After reviewing the risks and benefits, the patient was                        deemed in satisfactory condition to undergo the                        procedure.                       After obtaining informed consent, the endoscope was                        passed under direct vision. Throughout the procedure,  the patient's blood pressure, pulse, and oxygen                        saturations were monitored continuously. The Olympus                        190 Endoscope [id number]) was introduced through the                        mouth, and  advanced to the second part of duodenum. The                        upper GI endoscopy was accomplished without difficulty.                        The patient tolerated the procedure well. Findings:      One cratered esophageal ulcer with no bleeding and no stigmata of recent       bleeding was found at the gastroesophageal junction. The lesion was 12       mm in largest dimension. Biopsies were taken with a cold forceps for       histology.      LA Grade C (one or more mucosal breaks continuous between tops of 2 or       more mucosal folds, less than 75% circumference) esophagitis with no       bleeding was found 33 to 40 cm from the incisors.      A medium-sized hiatal hernia was present.      Localized mild inflammation characterized by erythema was found in the       gastric antrum.      Moderate inflammation was found in the duodenal bulb. Impression:           - Non-bleeding esophageal ulcer. Biopsied.                       - LA Grade C esophagitis.                       - Medium-sized hiatal hernia.                       - Gastritis.                       - Duodenitis. Recommendation:       - Await pathology results.                       - Discharge patient to home.                       - Resume previous diet.                       - Use a proton pump inhibitor PO daily. Procedure Code(s):    --- Professional ---                       [phone], Esophagogastroduodenoscopy, flexible, transoral;                        with biopsy, single or multiple Diagnosis Code(s):    --- Professional ---  R13.10, Dysphagia, unspecified                       K22.10, Ulcer of esophagus without bleeding                       K20.9, Esophagitis, unspecified                       K29.70, Gastritis, unspecified, without bleeding                       K29.80, Duodenitis without bleeding CPT copyright 2016 American Medical Association. All rights reserved. The codes documented in  this report are preliminary and upon coder review may  be revised to meet current compliance requirements. [name] MD, MD 08/15/2016 10:13:50 AM This report has been signed electronically. Number of Addenda: 0 Note Initiated On: 08/15/2016 9:58 AM Total Procedure Duration: 0 hours 4 minutes 14 seconds       [facility]

## 2016-08-15 NOTE — Anesthesia Postprocedure Evaluation (Signed)
Anesthesia Post Note  Patient: Travis Maddox  Procedure(s) Performed: Procedure(s) (LRB): ESOPHAGOGASTRODUODENOSCOPY (EGD) WITH PROPOFOL (N/A)  Patient location during evaluation: PACU Anesthesia Type: MAC Level of consciousness: awake Pain management: pain level controlled Vital Signs Assessment: post-procedure vital signs reviewed and stable Respiratory status: spontaneous breathing Cardiovascular status: blood pressure returned to baseline Postop Assessment: no headache Anesthetic complications: no    Lavonna Monarch

## 2016-08-15 NOTE — H&P (View-Only) (Signed)
Gastroenterology Consultation  Referring Provider:     [name] Primary Care Physician:  [name] Primary Gastroenterologist:  Dr. Allen Norris     Reason for Consultation:     Dysphagia        HPI:   Travis Maddox is a 61 y.o. y/o male referred for consultation & management of Dysphagia by Dr. [name].  This patient comes today with a history of dysphagia. The patient has a history of squamous cell carcinoma of the right lung. The squamous cell carcinoma of the lung is being followed by oncology the patient is presently receiving treatment. The patient has a history of severe esophagitis as per oncology.  Past Medical History:  Diagnosis Date  . Chronic fatigue   . Chronic fatigue   . Colon polyps   . Hyperlipemia   . Hypertension   . Hypothyroidism   . Schizophrenia ([place])   . Squamous cell carcinoma of right lung ([place]) 08/22/2014    Past Surgical History:  Procedure Laterality Date  . COLONOSCOPY     history of colon polyps  . ENDOBRONCHIAL ULTRASOUND  07/30/2014   right hilum FNA/EBUS  . PERIPHERAL VASCULAR CATHETERIZATION N/A 08/21/2014   Procedure: Glori Luis Cath Insertion;  Surgeon: Algernon Huxley, MD;  Location: [place] [facility];  Service: Cardiovascular;  Laterality: N/A;    Prior to Admission medications   Medication Sig Start Date End Date Taking? Authorizing Provider  atorvastatin (LIPITOR) 10 MG tablet Take 10 mg by mouth every morning.     Historical Provider, MD  calcium carbonate (TUMS - DOSED IN MG ELEMENTAL CALCIUM) 500 MG chewable tablet Chew 1 tablet by mouth 2 (two) times daily.    Historical Provider, MD  ferrous sulfate 325 (65 FE) MG tablet Take 325 mg by mouth daily with breakfast.    Historical Provider, MD  levothyroxine (SYNTHROID, LEVOTHROID) 88 MCG tablet Take 88 mcg by mouth every morning.     Historical Provider, MD  lisinopril (PRINIVIL,ZESTRIL) 10 MG tablet Take 10 mg by mouth daily.    Historical Provider, MD  OLANZapine (ZYPREXA) 20 MG  tablet Take 20 mg by mouth at bedtime.     Historical Provider, MD  prochlorperazine (COMPAZINE) 10 MG tablet TAKE 1 TABLET BY MOUTH EVERY 6 HOURS AS NEEDED FOR NAUSEA & VOMITING Patient not taking: Reported on 08/03/2016 07/26/16   [name], MD  propranolol (INDERAL) 10 MG tablet Take 10 mg by mouth 3 (three) times daily.    Historical Provider, MD  sucralfate (CARAFATE) 1 g tablet Take 1 g by mouth 4 (four) times daily -  with meals and at bedtime.    Historical Provider, MD    No family history on file.   Social History  Substance Use Topics  . Smoking status: Former Smoker    Packs/day: 0.25    Years: 42.00    Types: Cigarettes  . Smokeless tobacco: Never Used  . Alcohol use No    Allergies as of 08/10/2016  . (No Known Allergies)    Review of Systems:    All systems reviewed and negative except where noted in HPI.   Physical Exam:  There were no vitals taken for this visit. No LMP for male patient. Psych:  Alert and cooperative. Patient with schizophrenia. General:   Alert,  Well-developed, well-nourished, pleasant and cooperative in NAD Head:  Normocephalic and atraumatic. Eyes:  Sclera clear, no icterus.   Conjunctiva pink. Ears:  Normal auditory acuity. Nose:  No deformity, discharge, or lesions. Mouth:  No deformity or lesions,oropharynx pink & moist. Neck:  Supple; no masses or thyromegaly. Lungs:  Respirations even and unlabored.  Clear throughout to auscultation.   No wheezes, crackles, or rhonchi. No acute distress. Heart:  Regular rate and rhythm; no murmurs, clicks, rubs, or gallops. Abdomen:  Normal bowel sounds.  No bruits.  Soft, non-tender and non-distended without masses, hepatosplenomegaly or hernias noted.  No guarding or rebound tenderness.  Negative Carnett sign.   Rectal:  Deferred.  Msk:  Symmetrical without gross deformities.  Good, equal movement & strength bilaterally. Pulses:  Normal pulses noted. Extremities:  No clubbing or edema.   No cyanosis. Neurologic:  Alert. Patient with schizophrenia  grossly normal neurologically. Skin:  Intact without significant lesions or rashes.  No jaundice. Lymph Nodes:  No significant cervical adenopathy. Psych:  Alert and cooperative. Normal mood and affect.  Imaging Studies: Ct Chest W Contrast  Result Date: 07/12/2016 CLINICAL DATA:  Squamous cell carcinoma RIGHT lung diagnosis 2016. Chemotherapy ongoing. Restaging Squamous Cell CA of Right Lung diagnosed 07/2014. Pt c/o slight cough. He is currently receiving Tecentriq . NKI. Hx Port-a-cath. Former smoker, quit 2-3 years ago. ^ EXAM: [place]: Multidetector CT imaging of the chest was performed during intravenous contrast administration. CONTRAST:  77m ISOVUE-300 IOPAMIDOL (ISOVUE-300) INJECTION 61% COMPARISON:  CT 04/13/2016 FINDINGS: Cardiovascular: Coronary artery calcification and aortic atherosclerotic calcification. Mediastinum/Nodes: No axillary or supraclavicular adenopathy. No mediastinal hilar adenopathy. Diffuse esophageal wall thickening most severe from the level of the carina to the GE junction. This is similar to comparison exam. No evidence of high-grade obstruction. Small amount fluid in the upper esophagus. Lungs/Pleura: Two angular nodules at the LEFT lung apex are unchanged in size measuring 12 mm and 8 mm on image 31, series 3. A 5 mm nodule in the RIGTH lower lobe (image 122, series 3) is new from prior. There is perihilar angular consolidation with air bronchograms in the RIGHT upper lobe not changed from prior Upper Abdomen: Low-density lesion in the LEFT hepatic lobe is not changed. Adrenal glands normal. No upper abdominal adenopathy. Musculoskeletal: No aggressive osseous lesion. IMPRESSION: 1. New small nodule in the RIGHT lower lobe. Recommend attention on follow-up with differential including infection versus neoplasm. 2. Stable postsurgical and radiation change in the RIGHT upper lobe. 3. Stable  angular nodules in the LEFT lung apex. 4. Diffuse esophageal wall thickening of the lower esophagus not changed comparison exam. Potential radiation esophagitis. Electronically Signed   By: SSuzy BouchardM.D.   On: 07/12/2016 13:47    Assessment and Plan:   Travis GERTSCHis a 61y.o. y/o male who has a history of radiation to the chest and dysphagia to solids. The patient will be set up for an EGD to look for any esophageal strictures or any other cause for his dysphagia. I have discussed risks & benefits which include, but are not limited to, bleeding, infection, perforation & drug reaction.  The patient agrees with this plan & written consent will be obtained.       DLucilla Lame MD. FMarval Regal  Note: This dictation was prepared with Dragon dictation along with smaller phrase technology. Any transcriptional errors that result from this process are unintentional.

## 2016-08-15 NOTE — Transfer of Care (Signed)
Immediate Anesthesia Transfer of Care Note  Patient: Travis Maddox  Procedure(s) Performed: Procedure(s): ESOPHAGOGASTRODUODENOSCOPY (EGD) WITH PROPOFOL (N/A)  Patient Location: PACU  Anesthesia Type: MAC  Level of Consciousness: awake, alert  and patient cooperative  Airway and Oxygen Therapy: Patient Spontanous Breathing and Patient connected to supplemental oxygen  Post-op Assessment: Post-op Vital signs reviewed, Patient's Cardiovascular Status Stable, Respiratory Function Stable, Patent Airway and No signs of Nausea or vomiting  Post-op Vital Signs: Reviewed and stable  Complications: No apparent anesthesia complications

## 2016-08-15 NOTE — Anesthesia Preprocedure Evaluation (Addendum)
Anesthesia Evaluation  Patient identified by MRN, date of birth, ID band Patient awake    Airway Mallampati: III  TM Distance: <3 FB Neck ROM: Full    Dental  (+) Lower Dentures, Upper Dentures   Pulmonary former smoker,  R Lung cancer   Pulmonary exam normal breath sounds clear to auscultation       Cardiovascular hypertension, Normal cardiovascular exam Rhythm:Regular Rate:Normal     Neuro/Psych PSYCHIATRIC DISORDERS Schizophrenia    GI/Hepatic negative GI ROS, Neg liver ROS,   Endo/Other  Hypothyroidism   Renal/GU negative Renal ROS  negative genitourinary   Musculoskeletal negative musculoskeletal ROS (+)   Abdominal Normal abdominal exam  (+)  Abdomen: soft.    Peds  Hematology   Anesthesia Other Findings   Reproductive/Obstetrics negative OB ROS                            Anesthesia Physical Anesthesia Plan  ASA: III  Anesthesia Plan: MAC   Post-op Pain Management:    Induction:   Airway Management Planned: Mask  Additional Equipment: None  Intra-op Plan:   Post-operative Plan:   Informed Consent: I have reviewed the patients History and Physical, chart, labs and discussed the procedure including the risks, benefits and alternatives for the proposed anesthesia with the patient or authorized representative who has indicated his/her understanding and acceptance.     Plan Discussed with:   Anesthesia Plan Comments:         Anesthesia Quick Evaluation

## 2016-08-16 ENCOUNTER — Encounter: Payer: Self-pay | Admitting: Gastroenterology

## 2016-08-22 ENCOUNTER — Encounter: Payer: Self-pay | Admitting: Gastroenterology

## 2016-08-24 ENCOUNTER — Inpatient Hospital Stay: Payer: Medicare Other | Attending: Internal Medicine

## 2016-08-24 ENCOUNTER — Ambulatory Visit
Admission: RE | Admit: 2016-08-24 | Discharge: 2016-08-24 | Disposition: A | Discharge status: Home / Self Care | Payer: Medicare Other | Source: Ambulatory Visit | Attending: Internal Medicine | Admitting: Internal Medicine

## 2016-08-24 ENCOUNTER — Inpatient Hospital Stay (HOSPITAL_BASED_OUTPATIENT_CLINIC_OR_DEPARTMENT_OTHER): Payer: Medicare Other | Admitting: Internal Medicine

## 2016-08-24 ENCOUNTER — Inpatient Hospital Stay: Payer: Medicare Other

## 2016-08-24 VITALS — BP 143/93 | HR 69 | Temp 98.1°F | Resp 18 | Ht 72.0 in | Wt 195.0 lb

## 2016-08-24 DIAGNOSIS — Y842 Radiological procedure and radiotherapy as the cause of abnormal reaction of the patient, or of later complication, without mention of misadventure at the time of the procedure: Secondary | ICD-10-CM

## 2016-08-24 DIAGNOSIS — C3401 Malignant neoplasm of right main bronchus: Secondary | ICD-10-CM | POA: Insufficient documentation

## 2016-08-24 DIAGNOSIS — E785 Hyperlipidemia, unspecified: Secondary | ICD-10-CM

## 2016-08-24 DIAGNOSIS — Z86010 Personal history of colonic polyps: Secondary | ICD-10-CM | POA: Diagnosis not present

## 2016-08-24 DIAGNOSIS — Z5112 Encounter for antineoplastic immunotherapy: Secondary | ICD-10-CM | POA: Insufficient documentation

## 2016-08-24 DIAGNOSIS — Z923 Personal history of irradiation: Secondary | ICD-10-CM | POA: Insufficient documentation

## 2016-08-24 DIAGNOSIS — Z87891 Personal history of nicotine dependence: Secondary | ICD-10-CM | POA: Diagnosis not present

## 2016-08-24 DIAGNOSIS — R911 Solitary pulmonary nodule: Secondary | ICD-10-CM

## 2016-08-24 DIAGNOSIS — M7989 Other specified soft tissue disorders: Secondary | ICD-10-CM

## 2016-08-24 DIAGNOSIS — E039 Hypothyroidism, unspecified: Secondary | ICD-10-CM | POA: Insufficient documentation

## 2016-08-24 DIAGNOSIS — Z7952 Long term (current) use of systemic steroids: Secondary | ICD-10-CM | POA: Insufficient documentation

## 2016-08-24 DIAGNOSIS — F209 Schizophrenia, unspecified: Secondary | ICD-10-CM

## 2016-08-24 DIAGNOSIS — Z79899 Other long term (current) drug therapy: Secondary | ICD-10-CM

## 2016-08-24 DIAGNOSIS — K259 Gastric ulcer, unspecified as acute or chronic, without hemorrhage or perforation: Secondary | ICD-10-CM

## 2016-08-24 DIAGNOSIS — I10 Essential (primary) hypertension: Secondary | ICD-10-CM | POA: Diagnosis not present

## 2016-08-24 DIAGNOSIS — K210 Gastro-esophageal reflux disease with esophagitis: Secondary | ICD-10-CM | POA: Diagnosis not present

## 2016-08-24 LAB — COMPREHENSIVE METABOLIC PANEL
ALBUMIN: 3.6 g/dL (ref 3.5–5.0)
ALT: 10 U/L — AB (ref 17–63)
AST: 16 U/L (ref 15–41)
Alkaline Phosphatase: 87 U/L (ref 38–126)
Anion gap: 4 — ABNORMAL LOW (ref 5–15)
BUN: 15 mg/dL (ref 6–20)
CHLORIDE: 104 mmol/L (ref 101–111)
CO2: 28 mmol/L (ref 22–32)
CREATININE: 0.84 mg/dL (ref 0.61–1.24)
Calcium: 9.1 mg/dL (ref 8.9–10.3)
GFR calc Af Amer: >60 mL/min (ref >60)
GFR calc non Af Amer: >60 mL/min (ref >60)
Glucose, Bld: 93 mg/dL (ref 65–99)
POTASSIUM: 4.3 mmol/L (ref 3.5–5.1)
SODIUM: 136 mmol/L (ref 135–145)
Total Bilirubin: 0.6 mg/dL (ref 0.3–1.2)
Total Protein: 6.8 g/dL (ref 6.5–8.1)

## 2016-08-24 LAB — CBC WITH DIFFERENTIAL/PLATELET
BASOS ABS: 0 10*3/uL (ref 0–0.1)
BASOS PCT: 0 %
EOS ABS: 0 10*3/uL (ref 0–0.7)
EOS PCT: 0 %
HCT: 33.5 % — ABNORMAL LOW (ref 40.0–52.0)
Hemoglobin: 11.5 g/dL — ABNORMAL LOW (ref 13.0–18.0)
LYMPHS PCT: 14 %
Lymphs Abs: 0.9 10*3/uL — ABNORMAL LOW (ref 1.0–3.6)
MCH: 29.6 pg (ref 26.0–34.0)
MCHC: 34.4 g/dL (ref 32.0–36.0)
MCV: 86.2 fL (ref 80.0–100.0)
Monocytes Absolute: 0.8 10*3/uL (ref 0.2–1.0)
Monocytes Relative: 12 %
Neutro Abs: 4.9 10*3/uL (ref 1.4–6.5)
Neutrophils Relative %: 74 %
PLATELETS: 345 10*3/uL (ref 150–440)
RBC: 3.88 MIL/uL — AB (ref 4.40–5.90)
RDW: 13.2 % (ref 11.5–14.5)
WBC: 6.7 10*3/uL (ref 3.8–10.6)

## 2016-08-24 MED ORDER — HEPARIN SOD (PORK) LOCK FLUSH 100 UNIT/ML IV SOLN
500.0000 [IU] | Freq: Once | INTRAVENOUS | Status: AC
  Administered 2016-08-24: 500 [IU] via INTRAVENOUS
  Filled 2016-08-24: qty 5

## 2016-08-24 MED ORDER — SODIUM CHLORIDE 0.9% FLUSH
10.0000 mL | Freq: Once | INTRAVENOUS | Status: AC
  Administered 2016-08-24: 10 mL via INTRAVENOUS
  Filled 2016-08-24: qty 10

## 2016-08-24 MED ORDER — PREDNISONE 20 MG PO TABS: ORAL_TABLET | ORAL | 0 refills | Status: DC

## 2016-08-24 NOTE — Progress Notes (Signed)
[place] OFFICE PROGRESS NOTE  Patient Care Team: [name] as PCP - General (Internal Medicine)  [place] OFFICE PROGRESS NOTE  Patient Care Team: [name] as PCP - General (Internal Medicine)  Squamous cell carcinoma of right lung [facility])   Staging form: Lung, AJCC 7th Edition     Clinical stage from 07/17/2014: Stage IV (T4, N0, M1a) - Signed by Leia Alf, MD on 09/29/2014    Oncology History   # April 2016- SQUAMOUS CELL of LUNG ca [RUL] STAGE III; [EBUS-Right hilum];  (? Stage IV Left lung nodule- not Bx)   # FEB 2017-STAGE IV [contralateral lung nodules] PET Recurrence/Progression [high risk for [facility] 2017- START Tecentriq q 3W x3; April 2017 CT- LUL Stable/slight increase in size [~9m]; new lingula ~253m improved RUL radiation changes; Improved basilar nodules ? Atypical or infectious etilogy; JUne 12th CT-STABLE LUL.  # Schizophrenia/ group home resident     Malignant neoplasm of hilus of right lung ([place]  10/28/2015 Initial Diagnosis    Malignant neoplasm of hilus of right lung ([facility]       INTERVAL HISTORY: Poor historian given his schizophrenia. Accompanied by caregiver.  [age]ear old male patient with above history ofMetastatic squamous cell lung cancer is here for follow-up/ currently on Tecentriq- Is here for follow-up.  In the interim patient had evaluation with GI/endoscopy- showed gastric ulcer/reflux changes otherwise no strictures.   Patient is noted to have swelling in his bilateral hands or the last 2-3 days. Denies any significant pain however difficulty/discomfort while making a fist. Denies any trauma. Denies any tick bites. He denies any swelling anywhere else. No diarrhea. No shortness of breath or cough. No hemoptysis. No skin rash. No headaches or vision changes.    REVIEW OF SYSTEMS:  Difficult to assess given his poor history giving skills.  PAST MEDICAL HISTORY :  Past Medical  History:  Diagnosis Date  . Chronic fatigue   . Colon polyps   . Hyperlipemia   . Hypertension   . Hypothyroidism   . Schizophrenia ([facility]  . Squamous cell carcinoma of right lung ([facility]5/09/2014  . Wears dentures    full upper and lower    PAST SURGICAL HISTORY :   Past Surgical History:  Procedure Laterality Date  . COLONOSCOPY     history of colon polyps  . ENDOBRONCHIAL ULTRASOUND  07/30/2014   right hilum FNA/EBUS  . ESOPHAGOGASTRODUODENOSCOPY (EGD) WITH PROPOFOL N/A 08/15/2016   Procedure: ESOPHAGOGASTRODUODENOSCOPY (EGD) WITH PROPOFOL;  Surgeon: DaLucilla LameMD;  [facility] Service: Endoscopy;  Laterality: N/A;  . PERIPHERAL VASCULAR CATHETERIZATION N/A 08/21/2014   Procedure: PoGlori Luisath Insertion;  Surgeon: JaAlgernon HuxleyMD;  [facility];  Service: Cardiovascular;  Laterality: N/A;    FAMILY HISTORY :  No family history on file.  SOCIAL HISTORY:   Social History  Substance Use Topics  . Smoking status: Former Smoker    Packs/day: 0.25    Years: 42.00    Types: Cigarettes    Quit date: 2015  . Smokeless tobacco: Never Used  . Alcohol use No    ALLERGIES:  has No Known Allergies.  MEDICATIONS:  Current Outpatient Prescriptions  Medication Sig Dispense Refill  . atorvastatin (LIPITOR) 10 MG tablet Take 10 mg by mouth every morning.     . calcium carbonate (TUMS - DOSED IN MG ELEMENTAL CALCIUM) 500 MG chewable tablet Chew 1 tablet by mouth 2 (two) times  daily.    . ferrous sulfate 325 (65 FE) MG tablet Take 325 mg by mouth daily with breakfast.    . levothyroxine (SYNTHROID, LEVOTHROID) 88 MCG tablet Take 88 mcg by mouth every morning.     Marland Kitchen lisinopril (PRINIVIL,ZESTRIL) 10 MG tablet Take 10 mg by mouth daily.    Marland Kitchen OLANZapine (ZYPREXA) 20 MG tablet Take 20 mg by mouth at bedtime.     . pantoprazole (PROTONIX) 40 MG tablet Take 1 tablet (40 mg total) by mouth daily. 30 tablet 11  . prochlorperazine (COMPAZINE) 10 MG tablet TAKE  1 TABLET BY MOUTH EVERY 6 HOURS AS NEEDED FOR NAUSEA & VOMITING 30 tablet 0  . propranolol (INDERAL) 10 MG tablet Take 10 mg by mouth 3 (three) times daily.    . sucralfate (CARAFATE) 1 g tablet Take 1 g by mouth 4 (four) times daily -  with meals and at bedtime.    . predniSONE (DELTASONE) 20 MG tablet 2 pills once a day x 2 weeks; and then once a day 1 week. Take with food. 35 tablet 0   No current facility-administered medications for this visit.    Facility-Administered Medications Ordered in Other Visits  Medication Dose Route Frequency Provider Last Rate Last Dose  . sodium chloride flush (NS) 0.9 % injection 10 mL  10 mL Intracatheter PRN Lloyd Huger, MD   10 mL at 02/10/16 0940    PHYSICAL EXAMINATION: ECOG PERFORMANCE STATUS: 0 - Asymptomatic  BP (!) 143/93   Pulse 69   Temp 98.1 F (36.7 C) (Tympanic)   Resp 18   Ht 6' (1.829 m)   Wt 195 lb (88.5 kg)   BMI 26.45 kg/m   Filed Weights   08/24/16 0845  Weight: 195 lb (88.5 kg)    GENERAL: Well-nourished well-developed; Alert, no distress and comfortable.  Accompanied by caregiver. EYES: no pallor or icterus OROPHARYNX: no thrush or ulceration; good dentition  NECK: supple, no masses felt LYMPH:  no palpable lymphadenopathy in the cervical, axillary or inguinal regions LUNGS: clear to auscultation and  No wheeze or crackles HEART/CVS: regular rate & rhythm and no murmurs; No lower extremity edema ABDOMEN:abdomen soft, non-tender and normal bowel sounds Musculoskeletal:no cyanosis of digits and no clubbing; Bilateral hand swelling noted.  PSYCH: alert & oriented x 3 with fluent speech NEURO: no focal motor/sensory deficits SKIN:  no rashes or significant lesions     LABORATORY DATA:  I have reviewed the data as listed    Component Value Date/Time   NA 136 08/24/2016 0824   NA 135 07/28/2014 1121   K 4.3 08/24/2016 0824   K 4.3 07/28/2014 1121   CL 104 08/24/2016 0824   CL 101 07/28/2014 1121   CO2  28 08/24/2016 0824   CO2 25 07/28/2014 1121   GLUCOSE 93 08/24/2016 0824   GLUCOSE 135 (H) 07/28/2014 1121   BUN 15 08/24/2016 0824   BUN 19 07/28/2014 1121   CREATININE 0.84 08/24/2016 0824   CREATININE 1.36 (H) 07/28/2014 1121   CALCIUM 9.1 08/24/2016 0824   CALCIUM 9.1 07/28/2014 1121   PROT 6.8 08/24/2016 0824   PROT 7.6 07/23/2014 1042   ALBUMIN 3.6 08/24/2016 0824   ALBUMIN 4.2 07/23/2014 1042   AST 16 08/24/2016 0824   AST 24 07/23/2014 1042   ALT 10 (L) 08/24/2016 0824   ALT 24 07/23/2014 1042   ALKPHOS 87 08/24/2016 0824   ALKPHOS 108 07/23/2014 1042   BILITOT 0.6 08/24/2016 2671  BILITOT 0.5 07/23/2014 1042   GFRNONAA >60 08/24/2016 0824   GFRNONAA 57 (L) 07/28/2014 1121   GFRAA >60 08/24/2016 0824   GFRAA >60 07/28/2014 1121    No results found for: SPEP, UPEP  Lab Results  Component Value Date   WBC 6.7 08/24/2016   NEUTROABS 4.9 08/24/2016   HGB 11.5 (L) 08/24/2016   HCT 33.5 (L) 08/24/2016   MCV 86.2 08/24/2016   PLT 345 08/24/2016      Chemistry      Component Value Date/Time   NA 136 08/24/2016 0824   NA 135 07/28/2014 1121   K 4.3 08/24/2016 0824   K 4.3 07/28/2014 1121   CL 104 08/24/2016 0824   CL 101 07/28/2014 1121   CO2 28 08/24/2016 0824   CO2 25 07/28/2014 1121   BUN 15 08/24/2016 0824   BUN 19 07/28/2014 1121   CREATININE 0.84 08/24/2016 0824   CREATININE 1.36 (H) 07/28/2014 1121      Component Value Date/Time   CALCIUM 9.1 08/24/2016 0824   CALCIUM 9.1 07/28/2014 1121   ALKPHOS 87 08/24/2016 0824   ALKPHOS 108 07/23/2014 1042   AST 16 08/24/2016 0824   AST 24 07/23/2014 1042   ALT 10 (L) 08/24/2016 0824   ALT 24 07/23/2014 1042   BILITOT 0.6 08/24/2016 0824   BILITOT 0.5 07/23/2014 1042     IMPRESSION: 1. New small nodule in the RIGHT lower lobe. Recommend attention on follow-up with differential including infection versus neoplasm. 2. Stable postsurgical and radiation change in the RIGHT upper lobe. 3. Stable angular  nodules in the LEFT lung apex. 4. Diffuse esophageal wall thickening of the lower esophagus not changed comparison exam. Potential radiation esophagitis.   Electronically Signed   By: Suzy Bouchard M.D.   On: 07/12/2016 13:47 RADIOGRAPHIC STUDIES: I have personally reviewed the radiological images as listed and agreed with the findings in the report. Dg Hand 2 View Right  Result Date: 08/24/2016 CLINICAL DATA:  Bilateral hand pain and swelling for the past 3 days with no reported injury. EXAM: RIGHT HAND - 2 VIEW; LEFT HAND - COMPLETE 3+ VIEW COMPARISON:  None in PACs FINDINGS: Right hand: The bones are subjectively adequately mineralized. The joint spaces are well maintained. There is no acute fracture or dislocation. There is no lytic or blastic bony lesion. The soft tissues exhibit no abnormal densities and are only mildly swollen. Left hand: The bones are subjectively adequately mineralized. There is no acute or healing fracture. There is no lytic nor blastic bony lesion. The joint spaces are well maintained there is no acute fracture or dislocation. The soft tissues of the hand exhibit no abnormal densities and are only mildly swollen. IMPRESSION: No acute bony abnormality is observed. No significant arthropathy is evident. Mild soft tissue swelling. Electronically Signed   By: David  Martinique M.D.   On: 08/24/2016 10:35   Dg Hand Complete Left  Result Date: 08/24/2016 CLINICAL DATA:  Bilateral hand pain and swelling for the past 3 days with no reported injury. EXAM: RIGHT HAND - 2 VIEW; LEFT HAND - COMPLETE 3+ VIEW COMPARISON:  None in PACs FINDINGS: Right hand: The bones are subjectively adequately mineralized. The joint spaces are well maintained. There is no acute fracture or dislocation. There is no lytic or blastic bony lesion. The soft tissues exhibit no abnormal densities and are only mildly swollen. Left hand: The bones are subjectively adequately mineralized. There is no acute or  healing fracture. There is no  lytic nor blastic bony lesion. The joint spaces are well maintained there is no acute fracture or dislocation. The soft tissues of the hand exhibit no abnormal densities and are only mildly swollen. IMPRESSION: No acute bony abnormality is observed. No significant arthropathy is evident. Mild soft tissue swelling. Electronically Signed   By: David  Martinique M.D.   On: 08/24/2016 10:35     ASSESSMENT & PLAN:  Malignant neoplasm of hilus of right lung ([place]) # SQUAMOUS Cell LUNG CA- STAGE IV- [ tumor nodule in the contralateral lung/ recurrence]; DEC 27th CT- STABLE. Currently on tecentriq.   # No concerns for clinical progression.  CT march 27th- Stable LUL lung nodules/ right stable radiation changes; new 5 mm nodule in RLL; and esophagitis changes [see discussion below]. HOLD treatment today [given bil hand swelling- see discussion below]  # Bilateral hand swelling- ? Etiology; good pulses. ? Sec to immunotherapy- X-rays of hands; start prednisone today.   # Severe esophagitis; intermittently symptomatic; no weight loss- s/p EGD [Dr.Wohl]- non-bleeding gastric ulcer- Bx- Negative for malignancy. Discussed with pt/care giver.    # follow up in 3 weeks/labs/MD-infusion; and covering MD in 6 weeks.    Orders Placed This Encounter  Procedures  . DG Hand 2 View Right    Standing Status:   Future    Number of Occurrences:   1    Standing Expiration Date:   10/24/2017    Order Specific Question:   Reason for Exam (SYMPTOM  OR DIAGNOSIS REQUIRED)    Answer:   hadn swelling    Order Specific Question:   Preferred imaging location?    Answer:   [facility]    Order Specific Question:   Radiology Contrast Protocol - do NOT remove file path    Answer:   \\[email or web address].[email or web address]  . DG Hand Complete Left    Standing Status:   Future    Number of Occurrences:   1    Standing Expiration Date:   10/24/2017    Order Specific Question:    Reason for Exam (SYMPTOM  OR DIAGNOSIS REQUIRED)    Answer:   hand swelling    Order Specific Question:   Preferred imaging location?    Answer:   [facility]    Order Specific Question:   Radiology Contrast Protocol - do NOT remove file path    Answer:   \\[email or web address].pdf  . CBC with Differential/Platelet    Standing Status:   Future    Standing Expiration Date:   08/24/2017  . Comprehensive metabolic panel    Standing Status:   Future    Standing Expiration Date:   08/24/2017  . TSH    Standing Status:   Future    Standing Expiration Date:   11/24/2016   All questions were answered. The patient knows to call the clinic with any problems, questions or concerns.      [name], MD 08/24/2016 4:32 PM       [name], MD 08/24/2016 4:32 PM

## 2016-08-24 NOTE — Assessment & Plan Note (Addendum)
#   SQUAMOUS Cell LUNG CA- STAGE IV- [ tumor nodule in the contralateral lung/ recurrence]; DEC 27th CT- STABLE. Currently on tecentriq.   # No concerns for clinical progression.  CT march 27th- Stable LUL lung nodules/ right stable radiation changes; new 5 mm nodule in RLL; and esophagitis changes [see discussion below]. HOLD treatment today [given bil hand swelling- see discussion below]  # Bilateral hand swelling- ? Etiology; good pulses. ? Sec to immunotherapy- X-rays of hands; start prednisone today.   # Severe esophagitis; intermittently symptomatic; no weight loss- s/p EGD [Dr.Wohl]- non-bleeding gastric ulcer- Bx- Negative for malignancy. Discussed with pt/care giver.    # follow up in 3 weeks/labs/MD-infusion; and covering MD in 6 weeks.

## 2016-08-24 NOTE — Progress Notes (Signed)
Patient here for follow-up for lung cancer. Pt c/o bil. Hand edema with mild numbness. Hands do not itch. This has started since yesterday.  No evidence of edema in lower extremity.  No evidence of a rash. Pt's caregiver states that pt started Protonix 1 week ago.  Pt states that he has been drinking 3 water bottles a day. He denies any shortness of breath or chest pain. Denies any NVD.

## 2016-08-31 ENCOUNTER — Other Ambulatory Visit: Payer: Self-pay | Admitting: Internal Medicine

## 2016-08-31 DIAGNOSIS — R112 Nausea with vomiting, unspecified: Secondary | ICD-10-CM

## 2016-08-31 DIAGNOSIS — C3491 Malignant neoplasm of unspecified part of right bronchus or lung: Secondary | ICD-10-CM

## 2016-09-14 ENCOUNTER — Inpatient Hospital Stay: Payer: Medicare Other

## 2016-09-14 ENCOUNTER — Inpatient Hospital Stay (HOSPITAL_BASED_OUTPATIENT_CLINIC_OR_DEPARTMENT_OTHER): Payer: Medicare Other | Admitting: Internal Medicine

## 2016-09-14 ENCOUNTER — Ambulatory Visit: Payer: Medicare Other | Admitting: Gastroenterology

## 2016-09-14 VITALS — BP 137/89 | HR 68 | Temp 96.6°F | Resp 18 | Wt 193.0 lb

## 2016-09-14 DIAGNOSIS — K259 Gastric ulcer, unspecified as acute or chronic, without hemorrhage or perforation: Secondary | ICD-10-CM

## 2016-09-14 DIAGNOSIS — Z5112 Encounter for antineoplastic immunotherapy: Secondary | ICD-10-CM | POA: Diagnosis not present

## 2016-09-14 DIAGNOSIS — E785 Hyperlipidemia, unspecified: Secondary | ICD-10-CM | POA: Diagnosis not present

## 2016-09-14 DIAGNOSIS — C3401 Malignant neoplasm of right main bronchus: Secondary | ICD-10-CM | POA: Diagnosis not present

## 2016-09-14 DIAGNOSIS — I10 Essential (primary) hypertension: Secondary | ICD-10-CM

## 2016-09-14 DIAGNOSIS — Z87891 Personal history of nicotine dependence: Secondary | ICD-10-CM

## 2016-09-14 DIAGNOSIS — Y842 Radiological procedure and radiotherapy as the cause of abnormal reaction of the patient, or of later complication, without mention of misadventure at the time of the procedure: Secondary | ICD-10-CM

## 2016-09-14 DIAGNOSIS — K210 Gastro-esophageal reflux disease with esophagitis: Secondary | ICD-10-CM

## 2016-09-14 DIAGNOSIS — Z86010 Personal history of colonic polyps: Secondary | ICD-10-CM

## 2016-09-14 DIAGNOSIS — Z923 Personal history of irradiation: Secondary | ICD-10-CM | POA: Diagnosis not present

## 2016-09-14 DIAGNOSIS — E039 Hypothyroidism, unspecified: Secondary | ICD-10-CM | POA: Diagnosis not present

## 2016-09-14 DIAGNOSIS — R911 Solitary pulmonary nodule: Secondary | ICD-10-CM

## 2016-09-14 DIAGNOSIS — F209 Schizophrenia, unspecified: Secondary | ICD-10-CM

## 2016-09-14 DIAGNOSIS — Z7952 Long term (current) use of systemic steroids: Secondary | ICD-10-CM

## 2016-09-14 DIAGNOSIS — M7989 Other specified soft tissue disorders: Secondary | ICD-10-CM

## 2016-09-14 DIAGNOSIS — Z79899 Other long term (current) drug therapy: Secondary | ICD-10-CM

## 2016-09-14 LAB — CBC WITH DIFFERENTIAL/PLATELET
BASOS PCT: 0 %
Basophils Absolute: 0 10*3/uL (ref 0–0.1)
Eosinophils Absolute: 0 10*3/uL (ref 0–0.7)
Eosinophils Relative: 0 %
HEMATOCRIT: 35.7 % — AB (ref 40.0–52.0)
HEMOGLOBIN: 11.9 g/dL — AB (ref 13.0–18.0)
LYMPHS ABS: 0.8 10*3/uL — AB (ref 1.0–3.6)
LYMPHS PCT: 7 %
MCH: 28.6 pg (ref 26.0–34.0)
MCHC: 33.5 g/dL (ref 32.0–36.0)
MCV: 85.5 fL (ref 80.0–100.0)
MONOS PCT: 7 %
Monocytes Absolute: 0.8 10*3/uL (ref 0.2–1.0)
NEUTROS ABS: 9.7 10*3/uL — AB (ref 1.4–6.5)
NEUTROS PCT: 86 %
Platelets: 271 10*3/uL (ref 150–440)
RBC: 4.17 MIL/uL — ABNORMAL LOW (ref 4.40–5.90)
RDW: 13.7 % (ref 11.5–14.5)
WBC: 11.2 10*3/uL — ABNORMAL HIGH (ref 3.8–10.6)

## 2016-09-14 LAB — COMPREHENSIVE METABOLIC PANEL
ALBUMIN: 3.6 g/dL (ref 3.5–5.0)
ALK PHOS: 65 U/L (ref 38–126)
ALT: 12 U/L — ABNORMAL LOW (ref 17–63)
ANION GAP: 5 (ref 5–15)
AST: 16 U/L (ref 15–41)
BILIRUBIN TOTAL: 0.4 mg/dL (ref 0.3–1.2)
BUN: 19 mg/dL (ref 6–20)
CALCIUM: 9.5 mg/dL (ref 8.9–10.3)
CO2: 30 mmol/L (ref 22–32)
Chloride: 103 mmol/L (ref 101–111)
Creatinine, Ser: 1.02 mg/dL (ref 0.61–1.24)
GFR calc Af Amer: >60 mL/min (ref >60)
GLUCOSE: 102 mg/dL — AB (ref 65–99)
Potassium: 4 mmol/L (ref 3.5–5.1)
Sodium: 138 mmol/L (ref 135–145)
TOTAL PROTEIN: 6.4 g/dL — AB (ref 6.5–8.1)

## 2016-09-14 LAB — TSH: TSH: 2.115 u[IU]/mL (ref 0.350–4.500)

## 2016-09-14 MED ORDER — ATEZOLIZUMAB CHEMO INJECTION 1200 MG/20ML
1200.0000 mg | Freq: Once | INTRAVENOUS | Status: AC
  Administered 2016-09-14: 1200 mg via INTRAVENOUS
  Filled 2016-09-14: qty 20

## 2016-09-14 MED ORDER — HEPARIN SOD (PORK) LOCK FLUSH 100 UNIT/ML IV SOLN
500.0000 [IU] | Freq: Once | INTRAVENOUS | Status: AC | PRN
  Administered 2016-09-14: 500 [IU]
  Filled 2016-09-14: qty 5

## 2016-09-14 MED ORDER — SODIUM CHLORIDE 0.9% FLUSH
10.0000 mL | INTRAVENOUS | Status: DC | PRN
  Administered 2016-09-14: 10 mL
  Filled 2016-09-14: qty 10

## 2016-09-14 MED ORDER — SODIUM CHLORIDE 0.9 % IV SOLN
Freq: Once | INTRAVENOUS | Status: AC
  Administered 2016-09-14: 09:00:00 via INTRAVENOUS
  Filled 2016-09-14: qty 1000

## 2016-09-14 NOTE — Progress Notes (Signed)
Patient here today for follow up.  Patient states no new concerns today  

## 2016-09-14 NOTE — Assessment & Plan Note (Addendum)
#   SQUAMOUS Cell LUNG CA- STAGE IV- [ tumor nodule in the contralateral lung/ recurrence]; Currently on tecentriq.  No concerns for clinical progression.  CT march 27th- Stable LUL lung nodules/ right stable radiation changes; new 5 mm nodule in RLL.   #Re-start Tecentrqir today [se discussion below]; Labs today reviewed;  acceptable for treatment today.   # Bilateral hand swelling- ? Etiology  ? Sec to immunotherapy- X-rays of hands- Normal improved s/p Prednisone.    # follow up in 3 weeks/labs/X-MD-infusion; and  MD in 6 weeks/ infusion/labs.

## 2016-09-14 NOTE — Progress Notes (Signed)
[place] OFFICE PROGRESS NOTE  Patient Care Team: [name] as PCP - General (Internal Medicine)  [place] OFFICE PROGRESS NOTE  Patient Care Team: [name] as PCP - General (Internal Medicine)  Squamous cell carcinoma of right lung [facility])   Staging form: Lung, AJCC 7th Edition     Clinical stage from 07/17/2014: Stage IV (T4, N0, M1a) - Signed by Leia Alf, MD on 09/29/2014    Oncology History   # April 2016- SQUAMOUS CELL of LUNG ca [RUL] STAGE III; [EBUS-Right hilum];  (? Stage IV Left lung nodule- not Bx)   # FEB 2017-STAGE IV [contralateral lung nodules] PET Recurrence/Progression [high risk for [facility] 2017- START Tecentriq q 3W x3; April 2017 CT- LUL Stable/slight increase in size [~81mm]; new lingula ~81mm; improved RUL radiation changes; Improved basilar nodules ? Atypical or infectious etilogy; JUne 12th CT-STABLE LUL.  # Schizophrenia/ group home resident     Malignant neoplasm of hilus of right lung ([place])   10/28/2015 Initial Diagnosis    Malignant neoplasm of hilus of right lung ([place])        INTERVAL HISTORY: Poor historian given his schizophrenia. Accompanied by caregiver.  61 year old male patient with above history ofMetastatic squamous cell lung cancer is here for follow-up/ currently on Tecentriq- Is here for follow-up.  Immunotherapy was held at last visit because of swelling of the bilateral hands/feet. Patient was treated with prednisone. Currently resolved. Is currently off prednisone.   No diarrhea. No shortness of breath or cough. No hemoptysis. No skin rash. No headaches or vision changes.    REVIEW OF SYSTEMS:  Difficult to assess given his poor history giving skills.  PAST MEDICAL HISTORY :  Past Medical History:  Diagnosis Date  . Chronic fatigue   . Colon polyps   . Hyperlipemia   . Hypertension   . Hypothyroidism   . Schizophrenia ([place])   . Squamous cell carcinoma of right lung  ([place]) 08/22/2014  . Wears dentures    full upper and lower    PAST SURGICAL HISTORY :   Past Surgical History:  Procedure Laterality Date  . COLONOSCOPY     history of colon polyps  . ENDOBRONCHIAL ULTRASOUND  07/30/2014   right hilum FNA/EBUS  . ESOPHAGOGASTRODUODENOSCOPY (EGD) WITH PROPOFOL N/A 08/15/2016   Procedure: ESOPHAGOGASTRODUODENOSCOPY (EGD) WITH PROPOFOL;  Surgeon: [name], MD;  Location: [place];  Service: Endoscopy;  Laterality: N/A;  . PERIPHERAL VASCULAR CATHETERIZATION N/A 08/21/2014   Procedure: Glori Luis Cath Insertion;  Surgeon: Algernon Huxley, MD;  Location: [place] [facility];  Service: Cardiovascular;  Laterality: N/A;    FAMILY HISTORY :  No family history on file.  SOCIAL HISTORY:   Social History  Substance Use Topics  . Smoking status: Former Smoker    Packs/day: 0.25    Years: 42.00    Types: Cigarettes    Quit date: 2015  . Smokeless tobacco: Never Used  . Alcohol use No    ALLERGIES:  has No Known Allergies.  MEDICATIONS:  Current Outpatient Prescriptions  Medication Sig Dispense Refill  . atorvastatin (LIPITOR) 10 MG tablet Take 10 mg by mouth every morning.     . calcium carbonate (TUMS - DOSED IN MG ELEMENTAL CALCIUM) 500 MG chewable tablet Chew 1 tablet by mouth 2 (two) times daily.    . ferrous sulfate 325 (65 FE) MG tablet Take 325 mg by mouth daily with breakfast.    . levothyroxine (SYNTHROID, LEVOTHROID) 88 MCG  tablet Take 88 mcg by mouth every morning.     Marland Kitchen lisinopril (PRINIVIL,ZESTRIL) 10 MG tablet Take 10 mg by mouth daily.    Marland Kitchen OLANZapine (ZYPREXA) 20 MG tablet Take 20 mg by mouth at bedtime.     . pantoprazole (PROTONIX) 40 MG tablet Take 1 tablet (40 mg total) by mouth daily. 30 tablet 11  . prochlorperazine (COMPAZINE) 10 MG tablet TAKE 1 TABLET BY MOUTH EVERY 6 HOURS AS NEEDED FOR NAUSEA & VOMITING 30 tablet 0  . propranolol (INDERAL) 10 MG tablet Take 10 mg by mouth 3 (three) times daily.    . sucralfate (CARAFATE) 1  g tablet Take 1 g by mouth 4 (four) times daily -  with meals and at bedtime.     No current facility-administered medications for this visit.    Facility-Administered Medications Ordered in Other Visits  Medication Dose Route Frequency Provider Last Rate Last Dose  . sodium chloride flush (NS) 0.9 % injection 10 mL  10 mL Intracatheter PRN Lloyd Huger, MD   10 mL at 02/10/16 0940    PHYSICAL EXAMINATION: ECOG PERFORMANCE STATUS: 0 - Asymptomatic  BP 137/89 (BP Location: Right Arm, Patient Position: Sitting)   Pulse 68   Temp (!) 96.6 F (35.9 C) (Tympanic)   Resp 18   Wt 193 lb (87.5 kg)   SpO2 98%   BMI 26.18 kg/m   Filed Weights   09/14/16 0843  Weight: 193 lb (87.5 kg)    GENERAL: Well-nourished well-developed; Alert, no distress and comfortable.  Accompanied by caregiver. EYES: no pallor or icterus OROPHARYNX: no thrush or ulceration; good dentition  NECK: supple, no masses felt LYMPH:  no palpable lymphadenopathy in the cervical, axillary or inguinal regions LUNGS: clear to auscultation and  No wheeze or crackles HEART/CVS: regular rate & rhythm and no murmurs; No lower extremity edema ABDOMEN:abdomen soft, non-tender and normal bowel sounds Musculoskeletal:no cyanosis of digits and no clubbing; Bilateral hand swelling noted.  PSYCH: alert & oriented x 3 with fluent speech NEURO: no focal motor/sensory deficits SKIN:  no rashes or significant lesions     LABORATORY DATA:  I have reviewed the data as listed    Component Value Date/Time   NA 138 09/14/2016 0815   NA 135 07/28/2014 1121   K 4.0 09/14/2016 0815   K 4.3 07/28/2014 1121   CL 103 09/14/2016 0815   CL 101 07/28/2014 1121   CO2 30 09/14/2016 0815   CO2 25 07/28/2014 1121   GLUCOSE 102 (H) 09/14/2016 0815   GLUCOSE 135 (H) 07/28/2014 1121   BUN 19 09/14/2016 0815   BUN 19 07/28/2014 1121   CREATININE 1.02 09/14/2016 0815   CREATININE 1.36 (H) 07/28/2014 1121   CALCIUM 9.5 09/14/2016  0815   CALCIUM 9.1 07/28/2014 1121   PROT 6.4 (L) 09/14/2016 0815   PROT 7.6 07/23/2014 1042   ALBUMIN 3.6 09/14/2016 0815   ALBUMIN 4.2 07/23/2014 1042   AST 16 09/14/2016 0815   AST 24 07/23/2014 1042   ALT 12 (L) 09/14/2016 0815   ALT 24 07/23/2014 1042   ALKPHOS 65 09/14/2016 0815   ALKPHOS 108 07/23/2014 1042   BILITOT 0.4 09/14/2016 0815   BILITOT 0.5 07/23/2014 1042   GFRNONAA >60 09/14/2016 0815   GFRNONAA 57 (L) 07/28/2014 1121   GFRAA >60 09/14/2016 0815   GFRAA >60 07/28/2014 1121    No results found for: SPEP, UPEP  Lab Results  Component Value Date   WBC 11.2 (H)  09/14/2016   NEUTROABS 9.7 (H) 09/14/2016   HGB 11.9 (L) 09/14/2016   HCT 35.7 (L) 09/14/2016   MCV 85.5 09/14/2016   PLT 271 09/14/2016      Chemistry      Component Value Date/Time   NA 138 09/14/2016 0815   NA 135 07/28/2014 1121   K 4.0 09/14/2016 0815   K 4.3 07/28/2014 1121   CL 103 09/14/2016 0815   CL 101 07/28/2014 1121   CO2 30 09/14/2016 0815   CO2 25 07/28/2014 1121   BUN 19 09/14/2016 0815   BUN 19 07/28/2014 1121   CREATININE 1.02 09/14/2016 0815   CREATININE 1.36 (H) 07/28/2014 1121      Component Value Date/Time   CALCIUM 9.5 09/14/2016 0815   CALCIUM 9.1 07/28/2014 1121   ALKPHOS 65 09/14/2016 0815   ALKPHOS 108 07/23/2014 1042   AST 16 09/14/2016 0815   AST 24 07/23/2014 1042   ALT 12 (L) 09/14/2016 0815   ALT 24 07/23/2014 1042   BILITOT 0.4 09/14/2016 0815   BILITOT 0.5 07/23/2014 1042     IMPRESSION: 1. New small nodule in the RIGHT lower lobe. Recommend attention on follow-up with differential including infection versus neoplasm. 2. Stable postsurgical and radiation change in the RIGHT upper lobe. 3. Stable angular nodules in the LEFT lung apex. 4. Diffuse esophageal wall thickening of the lower esophagus not changed comparison exam. Potential radiation esophagitis.   Electronically Signed   By: Suzy Bouchard M.D.   On: 07/12/2016  13:47 RADIOGRAPHIC STUDIES: I have personally reviewed the radiological images as listed and agreed with the findings in the report. No results found.   ASSESSMENT & PLAN:  Malignant neoplasm of hilus of right lung ([place]) # SQUAMOUS Cell LUNG CA- STAGE IV- [ tumor nodule in the contralateral lung/ recurrence]; Currently on tecentriq.  No concerns for clinical progression.  CT march 27th- Stable LUL lung nodules/ right stable radiation changes; new 5 mm nodule in RLL.   #Re-start Tecentrqir today [se discussion below]; Labs today reviewed;  acceptable for treatment today.   # Bilateral hand swelling- ? Etiology  ? Sec to immunotherapy- X-rays of hands- Normal improved s/p Prednisone.    # follow up in 3 weeks/labs/X-MD-infusion; and  MD in 6 weeks/ infusion/labs.    Orders Placed This Encounter  Procedures  . CBC with Differential    Standing Status:   Future    Standing Expiration Date:   09/14/2017  . Comprehensive metabolic panel    Standing Status:   Future    Standing Expiration Date:   09/14/2017  . TSH    Standing Status:   Future    Standing Expiration Date:   09/14/2017  . CBC with Differential    Standing Status:   Future    Standing Expiration Date:   09/14/2017  . Comprehensive metabolic panel    Standing Status:   Future    Standing Expiration Date:   09/14/2017   All questions were answered. The patient knows to call the clinic with any problems, questions or concerns.      [name], MD 09/15/2016 9:05 PM       [name], MD 09/15/2016 9:05 PM

## 2016-09-29 ENCOUNTER — Telehealth: Payer: Self-pay | Admitting: *Deleted

## 2016-09-29 MED ORDER — PREDNISONE 20 MG PO TABS: ORAL_TABLET | ORAL | 0 refills | Status: DC

## 2016-09-29 NOTE — Telephone Encounter (Signed)
Per Dr Mike Gip, repeat prednisone prescription he had last month. Linda informed

## 2016-09-29 NOTE — Telephone Encounter (Signed)
Complains of swelling in feet and hands Dr Tish Men had given him steroids for it and it has improved but it is not gone and is asking that some more be prescribed. He went to see his PCP this morning who declined to order anything for it. Pleas advise  From Office note dated 09/14/16  "Immunotherapy was held at last visit because of swelling of the bilateral hands/feet. Patient was treated with prednisone. Currently resolved. 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  No concerns for clinical progression.  CT march 27th- Stable LUL lung nodules/ right stable radiation changes; new 5 mm nodule in RLL.   #Re-start Tecentrqir today [se discussion below]; Labs today reviewed;  acceptable for treatment today.   # Bilateral hand swelling- ? Etiology  ? Sec to immunotherapy- X-rays of hands- Normal improved s/p Prednisone.    # follow up in 3 weeks/labs/X-MD-infusion; and  MD in 6 weeks/ infusion/labs.   predniSONE (DELTASONE) 20 MG tablet [[id number]] DISCONTINUED  Order Details  Dose, Route, Frequency: As Directed   Dispense Quantity:  35 tablet Refills:  0 Fills remaining:  --        Sig: 2 pills once a day x 2 weeks; and then once a day 1 week. Take with food.       Discontinue Date:  09/14/2016 5638 Discontinue User:  Vernetta Honey, CMA Discontinue Reason:  Completed Course  Written Date:  08/24/16 Expiration Date:  08/24/17    Start Date:  08/24/16 End Date:  09/14/16

## 2016-10-04 NOTE — Progress Notes (Signed)
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 Accompanied by caregiver. Patient returns to clinic today for further evaluation and continuation of Tecentriq. He continues to tolerate his treatments well. He continues to have painful swollen fingers which is slightly improved with steroids prescribed last week. He otherwise feels well and is asymptomatic. He has no neurological point. He denies any recent fevers. He is a good appetite and denies weight loss. He has no chest pain or shortness of breath. He denies any nausea, vomiting, constipation, or diarrhea. He has no urinary complaints. Patient otherwise feels well and offers no further specific complaints.  REVIEW OF SYSTEMS:   Review of Systems  Constitutional: Negative.  Negative for fever, malaise/fatigue and weight loss.  HENT: Negative.   Respiratory: Negative.  Negative for cough, hemoptysis and shortness of breath.   Cardiovascular: Negative.  Negative for chest pain and leg swelling.  Gastrointestinal: Negative.  Negative for abdominal pain.  Genitourinary: Negative.   Musculoskeletal: Positive for joint pain.  Skin: Negative.  Negative for rash.  Neurological: Negative.  Negative for weakness.  Psychiatric/Behavioral: Negative.  The patient is not nervous/anxious.     As per HPI. Otherwise, a complete review of systems is negative.  PAST MEDICAL HISTORY: Past Medical History:  Diagnosis Date  . Chronic fatigue   . Colon polyps   . Hyperlipemia   . Hypertension   . Hypothyroidism   . Schizophrenia ([place])   . Squamous cell carcinoma of right lung ([place]) 08/22/2014  . Wears dentures    full upper and lower    PAST SURGICAL  HISTORY: Past Surgical History:  Procedure Laterality Date  . COLONOSCOPY     history of colon polyps  . ENDOBRONCHIAL ULTRASOUND  07/30/2014   right hilum FNA/EBUS  . ESOPHAGOGASTRODUODENOSCOPY (EGD) WITH PROPOFOL N/A 08/15/2016   Procedure: ESOPHAGOGASTRODUODENOSCOPY (EGD) WITH PROPOFOL;  Surgeon: [name], MD;  Location: [place];  Service: Endoscopy;  Laterality: N/A;  . PERIPHERAL VASCULAR CATHETERIZATION N/A 08/21/2014   Procedure: Glori Luis Cath Insertion;  Surgeon: Algernon Huxley, MD;  Location: [place] CV LAB;  Service: Cardiovascular;  Laterality: N/A;    FAMILY HISTORY: No family history on file.  ADVANCED DIRECTIVES (Y/N):  N  HEALTH MAINTENANCE: Social History  Substance Use Topics  . Smoking status: Former Smoker    Packs/day: 0.25    Years: 42.00    Types: Cigarettes    Quit date: 2015  . Smokeless tobacco: Never Used  . Alcohol use No     Colonoscopy:  PAP:  Bone density:  Lipid panel:  No Known Allergies  Current Outpatient Prescriptions  Medication Sig Dispense Refill  . atorvastatin (LIPITOR) 10 MG tablet Take 10 mg by mouth every morning.     . calcium carbonate (TUMS - DOSED IN MG ELEMENTAL CALCIUM) 500 MG chewable tablet Chew 1 tablet by mouth 2 (two) times daily.    . ferrous sulfate 325 (65 FE) MG tablet Take 325 mg by mouth daily with breakfast.    . levothyroxine (SYNTHROID, LEVOTHROID) 88 MCG tablet Take 88 mcg by mouth every morning.     Marland Kitchen lisinopril (PRINIVIL,ZESTRIL) 10 MG tablet Take 10 mg by mouth daily.    Marland Kitchen OLANZapine (ZYPREXA) 20 MG tablet  Take 20 mg by mouth at bedtime.     . pantoprazole (PROTONIX) 40 MG tablet Take 1 tablet (40 mg total) by mouth daily. 30 tablet 11  . predniSONE (DELTASONE) 20 MG tablet 2 tabs once a day for 2 weeks , then 1 tab daily for 1 week. Take with food. 35 tablet 0  . propranolol (INDERAL) 10 MG tablet Take 10 mg by mouth 3 (three) times daily.    . sucralfate (CARAFATE) 1 g tablet Take 1 g by  mouth 4 (four) times daily -  with meals and at bedtime.    . prochlorperazine (COMPAZINE) 10 MG tablet TAKE 1 TABLET BY MOUTH EVERY 6 HOURS AS NEEDED FOR NAUSEA & VOMITING (Patient not taking: Reported on 10/05/2016) 30 tablet 0   No current facility-administered medications for this visit.    Facility-Administered Medications Ordered in Other Visits  Medication Dose Route Frequency Provider Last Rate Last Dose  . sodium chloride flush (NS) 0.9 % injection 10 mL  10 mL Intracatheter PRN Lloyd Huger, MD   10 mL at 02/10/16 0940    OBJECTIVE: Vitals:   10/05/16 0932  BP: (!) 151/90  Pulse: 73  Resp: 20  Temp: 97.8 F (36.6 C)     Body mass index is 26.45 kg/m.    ECOG FS:1 - Symptomatic but completely ambulatory  General: Well-developed, well-nourished, no acute distress. Eyes: Pink conjunctiva, anicteric sclera. Lungs: Clear to auscultation bilaterally. Heart: Regular rate and rhythm. No rubs, murmurs, or gallops. Abdomen: Soft, nontender, nondistended. No organomegaly noted, normoactive bowel sounds. Musculoskeletal: Increased swelling and tenderness of bilateral fingers, improved over last week. Neuro: Alert, answering all questions appropriately. Cranial nerves grossly intact. Skin: No rashes or petechiae noted. Psych: Normal affect.   LAB RESULTS:  Lab Results  Component Value Date   NA 136 10/05/2016   K 4.1 10/05/2016   CL 103 10/05/2016   CO2 28 10/05/2016   GLUCOSE 102 (H) 10/05/2016   BUN 20 10/05/2016   CREATININE 0.88 10/05/2016   CALCIUM 9.1 10/05/2016   PROT 6.2 (L) 10/05/2016   ALBUMIN 3.1 (L) 10/05/2016   AST 17 10/05/2016   ALT 14 (L) 10/05/2016   ALKPHOS 67 10/05/2016   BILITOT 0.5 10/05/2016   GFRNONAA >60 10/05/2016   GFRAA >60 10/05/2016    Lab Results  Component Value Date   WBC 9.7 10/05/2016   NEUTROABS 8.6 (H) 10/05/2016   HGB 10.2 (L) 10/05/2016   HCT 29.6 (L) 10/05/2016   MCV 84.0 10/05/2016   PLT 463 (H) 10/05/2016      STUDIES: No results found.  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  No concerns for clinical progression.  CT march 27th- Stable LUL lung nodules/ right stable radiation changes; new 5 mm nodule in RLL.   #Proceed with Tecentriq today.   # Bilateral hand swelling: Unclear etiology. Appears to have improved with prednisone, patient has been instructed to decrease dose to 20 mg  # follow up in 3 weeks for further evaluation and continuation of treatment.   Patient expressed understanding and was in agreement with this plan. He also understands that He can call clinic at any time with any questions, concerns, or complaints.   Cancer Staging No matching staging information was found for the patient.  Lloyd Huger, MD   10/07/2016 3:16 PM

## 2016-10-05 ENCOUNTER — Inpatient Hospital Stay: Payer: Medicare Other

## 2016-10-05 ENCOUNTER — Inpatient Hospital Stay: Payer: Medicare Other | Attending: Oncology

## 2016-10-05 ENCOUNTER — Inpatient Hospital Stay (HOSPITAL_BASED_OUTPATIENT_CLINIC_OR_DEPARTMENT_OTHER): Payer: Medicare Other | Admitting: Oncology

## 2016-10-05 VITALS — BP 151/90 | HR 73 | Temp 97.8°F | Resp 20 | Ht 72.0 in | Wt 195.0 lb

## 2016-10-05 DIAGNOSIS — R5382 Chronic fatigue, unspecified: Secondary | ICD-10-CM

## 2016-10-05 DIAGNOSIS — I10 Essential (primary) hypertension: Secondary | ICD-10-CM | POA: Diagnosis not present

## 2016-10-05 DIAGNOSIS — Z79899 Other long term (current) drug therapy: Secondary | ICD-10-CM | POA: Diagnosis not present

## 2016-10-05 DIAGNOSIS — Z7952 Long term (current) use of systemic steroids: Secondary | ICD-10-CM | POA: Insufficient documentation

## 2016-10-05 DIAGNOSIS — E039 Hypothyroidism, unspecified: Secondary | ICD-10-CM | POA: Diagnosis not present

## 2016-10-05 DIAGNOSIS — Z87891 Personal history of nicotine dependence: Secondary | ICD-10-CM | POA: Insufficient documentation

## 2016-10-05 DIAGNOSIS — C3401 Malignant neoplasm of right main bronchus: Secondary | ICD-10-CM

## 2016-10-05 DIAGNOSIS — R600 Localized edema: Secondary | ICD-10-CM | POA: Diagnosis not present

## 2016-10-05 DIAGNOSIS — F209 Schizophrenia, unspecified: Secondary | ICD-10-CM

## 2016-10-05 DIAGNOSIS — E785 Hyperlipidemia, unspecified: Secondary | ICD-10-CM

## 2016-10-05 DIAGNOSIS — Z5112 Encounter for antineoplastic immunotherapy: Secondary | ICD-10-CM | POA: Insufficient documentation

## 2016-10-05 LAB — CBC WITH DIFFERENTIAL/PLATELET
Basophils Absolute: 0 10*3/uL (ref 0–0.1)
Basophils Relative: 0 %
EOS PCT: 0 %
Eosinophils Absolute: 0 10*3/uL (ref 0–0.7)
HCT: 29.6 % — ABNORMAL LOW (ref 40.0–52.0)
Hemoglobin: 10.2 g/dL — ABNORMAL LOW (ref 13.0–18.0)
LYMPHS ABS: 0.7 10*3/uL — AB (ref 1.0–3.6)
Lymphocytes Relative: 7 %
MCH: 28.9 pg (ref 26.0–34.0)
MCHC: 34.5 g/dL (ref 32.0–36.0)
MCV: 84 fL (ref 80.0–100.0)
MONO ABS: 0.5 10*3/uL (ref 0.2–1.0)
MONOS PCT: 5 %
Neutro Abs: 8.6 10*3/uL — ABNORMAL HIGH (ref 1.4–6.5)
Neutrophils Relative %: 88 %
PLATELETS: 463 10*3/uL — AB (ref 150–440)
RBC: 3.52 MIL/uL — AB (ref 4.40–5.90)
RDW: 13.4 % (ref 11.5–14.5)
WBC: 9.7 10*3/uL (ref 3.8–10.6)

## 2016-10-05 LAB — COMPREHENSIVE METABOLIC PANEL
ALT: 14 U/L — AB (ref 17–63)
ANION GAP: 5 (ref 5–15)
AST: 17 U/L (ref 15–41)
Albumin: 3.1 g/dL — ABNORMAL LOW (ref 3.5–5.0)
Alkaline Phosphatase: 67 U/L (ref 38–126)
BUN: 20 mg/dL (ref 6–20)
CALCIUM: 9.1 mg/dL (ref 8.9–10.3)
CHLORIDE: 103 mmol/L (ref 101–111)
CO2: 28 mmol/L (ref 22–32)
CREATININE: 0.88 mg/dL (ref 0.61–1.24)
Glucose, Bld: 102 mg/dL — ABNORMAL HIGH (ref 65–99)
Potassium: 4.1 mmol/L (ref 3.5–5.1)
SODIUM: 136 mmol/L (ref 135–145)
Total Bilirubin: 0.5 mg/dL (ref 0.3–1.2)
Total Protein: 6.2 g/dL — ABNORMAL LOW (ref 6.5–8.1)

## 2016-10-05 LAB — TSH: TSH: 1.962 u[IU]/mL (ref 0.350–4.500)

## 2016-10-05 MED ORDER — ONDANSETRON 8 MG PO TBDP
8.0000 mg | ORAL_TABLET | Freq: Once | ORAL | Status: AC
  Administered 2016-10-05: 8 mg via ORAL
  Filled 2016-10-05: qty 1

## 2016-10-05 MED ORDER — SODIUM CHLORIDE 0.9% FLUSH
10.0000 mL | Freq: Once | INTRAVENOUS | Status: AC
  Filled 2016-10-05: qty 10

## 2016-10-05 MED ORDER — ATEZOLIZUMAB CHEMO INJECTION 1200 MG/20ML
1200.0000 mg | Freq: Once | INTRAVENOUS | Status: AC
  Administered 2016-10-05: 1200 mg via INTRAVENOUS
  Filled 2016-10-05: qty 20

## 2016-10-05 MED ORDER — HEPARIN SOD (PORK) LOCK FLUSH 100 UNIT/ML IV SOLN: 500.0000 [IU] | Freq: Once | INTRAVENOUS | Status: AC

## 2016-10-05 MED ORDER — HEPARIN SOD (PORK) LOCK FLUSH 100 UNIT/ML IV SOLN
500.0000 [IU] | Freq: Once | INTRAVENOUS | Status: AC | PRN
  Administered 2016-10-05: 500 [IU]
  Filled 2016-10-05: qty 5

## 2016-10-05 MED ORDER — SODIUM CHLORIDE 0.9 % IV SOLN
Freq: Once | INTRAVENOUS | Status: AC
  Administered 2016-10-05: 10:00:00 via INTRAVENOUS
  Filled 2016-10-05: qty 1000

## 2016-10-05 MED ORDER — SODIUM CHLORIDE 0.9% FLUSH
10.0000 mL | Freq: Once | INTRAVENOUS | Status: AC
  Administered 2016-10-05: 10 mL via INTRAVENOUS
  Filled 2016-10-05: qty 10

## 2016-10-05 NOTE — Progress Notes (Signed)
Pt notices improvement in edema in feet and hands. Hands still slightly swollen. Dr. Mike Gip had prescribed prednisone 20 mg. He is now taking 20 mg daily.

## 2016-10-14 ENCOUNTER — Telehealth: Payer: Self-pay

## 2016-10-14 ENCOUNTER — Other Ambulatory Visit: Payer: Self-pay

## 2016-10-14 DIAGNOSIS — Z1211 Encounter for screening for malignant neoplasm of colon: Secondary | ICD-10-CM

## 2016-10-14 NOTE — Telephone Encounter (Signed)
Gastroenterology Pre-Procedure Review  Request Date: 11/24/16 Requesting Physician: Dr. Vicente Males  PATIENT REVIEW QUESTIONS: The patient responded to the following health history questions as indicated:    1. Are you having any GI issues? no 2. Do you have a personal history of Polyps? yes (2yrs ago) 3. Do you have a family history of Colon Cancer or Polyps? no 4. Diabetes Mellitus? no 5. Joint replacements in the past 12 months?no 6. Major health problems in the past 3 months?no 7. Any artificial heart valves, MVP, or defibrillator?no    MEDICATIONS & ALLERGIES:    Patient reports the following regarding taking any anticoagulation/antiplatelet therapy:   Plavix, Coumadin, Eliquis, Xarelto, Lovenox, Pradaxa, Brilinta, or Effient? no Aspirin? no  Patient confirms/reports the following medications:  Current Outpatient Prescriptions  Medication Sig Dispense Refill  . atorvastatin (LIPITOR) 10 MG tablet Take 10 mg by mouth every morning.     . calcium carbonate (TUMS - DOSED IN MG ELEMENTAL CALCIUM) 500 MG chewable tablet Chew 1 tablet by mouth 2 (two) times daily.    . ferrous sulfate 325 (65 FE) MG tablet Take 325 mg by mouth daily with breakfast.    . levothyroxine (SYNTHROID, LEVOTHROID) 88 MCG tablet Take 88 mcg by mouth every morning.     Marland Kitchen lisinopril (PRINIVIL,ZESTRIL) 10 MG tablet Take 10 mg by mouth daily.    Marland Kitchen OLANZapine (ZYPREXA) 20 MG tablet Take 20 mg by mouth at bedtime.     . pantoprazole (PROTONIX) 40 MG tablet Take 1 tablet (40 mg total) by mouth daily. 30 tablet 11  . predniSONE (DELTASONE) 20 MG tablet 2 tabs once a day for 2 weeks , then 1 tab daily for 1 week. Take with food. 35 tablet 0  . prochlorperazine (COMPAZINE) 10 MG tablet TAKE 1 TABLET BY MOUTH EVERY 6 HOURS AS NEEDED FOR NAUSEA & VOMITING (Patient not taking: Reported on 10/05/2016) 30 tablet 0  . propranolol (INDERAL) 10 MG tablet Take 10 mg by mouth 3 (three) times daily.    . sucralfate (CARAFATE) 1 g tablet  Take 1 g by mouth 4 (four) times daily -  with meals and at bedtime.     No current facility-administered medications for this visit.    Facility-Administered Medications Ordered in Other Visits  Medication Dose Route Frequency Provider Last Rate Last Dose  . sodium chloride flush (NS) 0.9 % injection 10 mL  10 mL Intracatheter PRN Lloyd Huger, MD   10 mL at 02/10/16 0940    Patient confirms/reports the following allergies:  No Known Allergies  No orders of the defined types were placed in this encounter.   AUTHORIZATION INFORMATION Primary Insurance: 1D#: Group #:  Secondary Insurance: 1D#: Group #:  SCHEDULE INFORMATION: Date: 11/24/16 Time: Location:[facility]

## 2016-10-26 ENCOUNTER — Inpatient Hospital Stay: Payer: Medicare Other | Attending: Internal Medicine | Admitting: Internal Medicine

## 2016-10-26 ENCOUNTER — Inpatient Hospital Stay: Payer: Medicare Other

## 2016-10-26 VITALS — BP 121/80 | HR 72 | Temp 97.8°F | Resp 20 | Ht 72.0 in | Wt 193.4 lb

## 2016-10-26 DIAGNOSIS — I10 Essential (primary) hypertension: Secondary | ICD-10-CM | POA: Diagnosis not present

## 2016-10-26 DIAGNOSIS — R600 Localized edema: Secondary | ICD-10-CM | POA: Insufficient documentation

## 2016-10-26 DIAGNOSIS — R5382 Chronic fatigue, unspecified: Secondary | ICD-10-CM | POA: Diagnosis not present

## 2016-10-26 DIAGNOSIS — C3401 Malignant neoplasm of right main bronchus: Secondary | ICD-10-CM | POA: Insufficient documentation

## 2016-10-26 DIAGNOSIS — E039 Hypothyroidism, unspecified: Secondary | ICD-10-CM | POA: Diagnosis not present

## 2016-10-26 DIAGNOSIS — E785 Hyperlipidemia, unspecified: Secondary | ICD-10-CM | POA: Diagnosis not present

## 2016-10-26 DIAGNOSIS — Z86010 Personal history of colonic polyps: Secondary | ICD-10-CM | POA: Insufficient documentation

## 2016-10-26 DIAGNOSIS — R911 Solitary pulmonary nodule: Secondary | ICD-10-CM | POA: Diagnosis not present

## 2016-10-26 DIAGNOSIS — F209 Schizophrenia, unspecified: Secondary | ICD-10-CM | POA: Insufficient documentation

## 2016-10-26 DIAGNOSIS — Y842 Radiological procedure and radiotherapy as the cause of abnormal reaction of the patient, or of later complication, without mention of misadventure at the time of the procedure: Secondary | ICD-10-CM | POA: Insufficient documentation

## 2016-10-26 DIAGNOSIS — Z79899 Other long term (current) drug therapy: Secondary | ICD-10-CM | POA: Insufficient documentation

## 2016-10-26 DIAGNOSIS — R1310 Dysphagia, unspecified: Secondary | ICD-10-CM

## 2016-10-26 DIAGNOSIS — Z7952 Long term (current) use of systemic steroids: Secondary | ICD-10-CM

## 2016-10-26 DIAGNOSIS — Z87891 Personal history of nicotine dependence: Secondary | ICD-10-CM | POA: Insufficient documentation

## 2016-10-26 DIAGNOSIS — K229 Disease of esophagus, unspecified: Secondary | ICD-10-CM | POA: Insufficient documentation

## 2016-10-26 LAB — COMPREHENSIVE METABOLIC PANEL
ALT: 10 U/L — ABNORMAL LOW (ref 17–63)
ANION GAP: 7 (ref 5–15)
AST: 15 U/L (ref 15–41)
Albumin: 3.3 g/dL — ABNORMAL LOW (ref 3.5–5.0)
Alkaline Phosphatase: 69 U/L (ref 38–126)
BUN: 13 mg/dL (ref 6–20)
CALCIUM: 9 mg/dL (ref 8.9–10.3)
CHLORIDE: 104 mmol/L (ref 101–111)
CO2: 27 mmol/L (ref 22–32)
Creatinine, Ser: 0.9 mg/dL (ref 0.61–1.24)
GFR calc non Af Amer: >60 mL/min (ref >60)
Glucose, Bld: 91 mg/dL (ref 65–99)
POTASSIUM: 4.3 mmol/L (ref 3.5–5.1)
SODIUM: 138 mmol/L (ref 135–145)
Total Bilirubin: 0.4 mg/dL (ref 0.3–1.2)
Total Protein: 6.1 g/dL — ABNORMAL LOW (ref 6.5–8.1)

## 2016-10-26 LAB — CBC WITH DIFFERENTIAL/PLATELET
BASOS PCT: 0 %
Basophils Absolute: 0 10*3/uL (ref 0–0.1)
EOS ABS: 0 10*3/uL (ref 0–0.7)
EOS PCT: 0 %
HCT: 30.8 % — ABNORMAL LOW (ref 40.0–52.0)
Hemoglobin: 10.4 g/dL — ABNORMAL LOW (ref 13.0–18.0)
LYMPHS ABS: 0.7 10*3/uL — AB (ref 1.0–3.6)
Lymphocytes Relative: 10 %
MCH: 28.6 pg (ref 26.0–34.0)
MCHC: 33.8 g/dL (ref 32.0–36.0)
MCV: 84.4 fL (ref 80.0–100.0)
MONOS PCT: 11 %
Monocytes Absolute: 0.8 10*3/uL (ref 0.2–1.0)
Neutro Abs: 5.6 10*3/uL (ref 1.4–6.5)
Neutrophils Relative %: 79 %
PLATELETS: 310 10*3/uL (ref 150–440)
RBC: 3.64 MIL/uL — ABNORMAL LOW (ref 4.40–5.90)
RDW: 14.5 % (ref 11.5–14.5)
WBC: 7.1 10*3/uL (ref 3.8–10.6)

## 2016-10-26 MED ORDER — PREDNISONE 20 MG PO TABS: ORAL_TABLET | ORAL | 0 refills | Status: DC

## 2016-10-26 MED ORDER — HEPARIN SOD (PORK) LOCK FLUSH 100 UNIT/ML IV SOLN
500.0000 [IU] | Freq: Once | INTRAVENOUS | Status: AC
  Administered 2016-10-26: 500 [IU] via INTRAVENOUS

## 2016-10-26 MED ORDER — SODIUM CHLORIDE 0.9% FLUSH
10.0000 mL | Freq: Once | INTRAVENOUS | Status: AC
  Administered 2016-10-26: 10 mL via INTRAVENOUS
  Filled 2016-10-26: qty 10

## 2016-10-26 NOTE — Progress Notes (Signed)
[place] OFFICE PROGRESS NOTE  Patient Care Team: [name], MD as PCP - General (Internal Medicine)  [place] OFFICE PROGRESS NOTE  Patient Care Team: [name], MD as PCP - General (Internal Medicine)  Squamous cell carcinoma of right lung [facility])   Staging form: Lung, AJCC 7th Edition     Clinical stage from 07/17/2014: Stage IV (T4, N0, M1a) - Signed by Leia Alf, MD on 09/29/2014    Oncology History   # April 2016- SQUAMOUS CELL of LUNG ca [RUL] STAGE III; [EBUS-Right hilum];  (? Stage IV Left lung nodule- not Bx)   # FEB 2017-STAGE IV [contralateral lung nodules] PET Recurrence/Progression [high risk for [facility] 2017- START Tecentriq q 3W x3; April 2017 CT- LUL Stable/slight increase in size [~73mm]; new lingula ~35mm; improved RUL radiation changes; Improved basilar nodules ? Atypical or infectious etilogy; JUne 12th CT-STABLE LUL.  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 Accompanied by caregiver.  61 year old male patient with above history ofMetastatic squamous cell lung cancer is here for follow-up/ currently on Tecentriq- Is here for follow-up.  Patient again complaining of swelling in his bilateral hands. Denies any pain. Denies any swelling in her else. Previously his symptoms resolved with prednisone. Patient is not currently on any prednisone.   No diarrhea. No shortness of breath or cough. No hemoptysis. No skin rash. No headaches or vision changes. Chronic mild difficulty swallowing   REVIEW OF SYSTEMS:  Difficult to assess given his poor history giving skills.  PAST MEDICAL HISTORY :  Past Medical History:  Diagnosis Date  . Chronic fatigue   . Colon polyps   . Hyperlipemia   . Hypertension   .  Hypothyroidism   . Schizophrenia ([place])   . Squamous cell carcinoma of right lung ([place]) 08/22/2014  . Wears dentures    full upper and lower    PAST SURGICAL HISTORY :   Past Surgical History:  Procedure Laterality Date  . COLONOSCOPY     history of colon polyps  . ENDOBRONCHIAL ULTRASOUND  07/30/2014   right hilum FNA/EBUS  . ESOPHAGOGASTRODUODENOSCOPY (EGD) WITH PROPOFOL N/A 08/15/2016   Procedure: ESOPHAGOGASTRODUODENOSCOPY (EGD) WITH PROPOFOL;  Surgeon: [name], MD;  Location: [place];  Service: Endoscopy;  Laterality: N/A;  . PERIPHERAL VASCULAR CATHETERIZATION N/A 08/21/2014   Procedure: Glori Luis Cath Insertion;  Surgeon: Algernon Huxley, MD;  Location: [place] CV LAB;  Service: Cardiovascular;  Laterality: N/A;    FAMILY HISTORY :  No family history on file.  SOCIAL HISTORY:   Social History  Substance Use Topics  . Smoking status: Former Smoker    Packs/day: 0.25    Years: 42.00    Types: Cigarettes    Quit date: 2015  . Smokeless tobacco: Never Used  . Alcohol use No    ALLERGIES:  has No Known Allergies.  MEDICATIONS:  Current Outpatient Prescriptions  Medication Sig Dispense Refill  . atorvastatin (LIPITOR) 10 MG tablet Take 10 mg by mouth every morning.     . calcium carbonate (TUMS - DOSED IN MG ELEMENTAL CALCIUM) 500 MG chewable tablet Chew 1 tablet by mouth 2 (two) times daily.    . ferrous sulfate 325 (65 FE) MG tablet Take 325 mg by mouth  daily with breakfast.    . levothyroxine (SYNTHROID, LEVOTHROID) 88 MCG tablet Take 88 mcg by mouth every morning.     Marland Kitchen lisinopril (PRINIVIL,ZESTRIL) 10 MG tablet Take 10 mg by mouth daily.    Marland Kitchen OLANZapine (ZYPREXA) 20 MG tablet Take 20 mg by mouth at bedtime.     . pantoprazole (PROTONIX) 40 MG tablet Take 1 tablet (40 mg total) by mouth daily. 30 tablet 11  . propranolol (INDERAL) 10 MG tablet Take 10 mg by mouth 3 (three) times daily.    . sucralfate (CARAFATE) 1 g tablet Take 1 g by mouth 4 (four) times  daily -  with meals and at bedtime.    . predniSONE (DELTASONE) 20 MG tablet Once a day with food x 14 days. 14 tablet 0  . prochlorperazine (COMPAZINE) 10 MG tablet TAKE 1 TABLET BY MOUTH EVERY 6 HOURS AS NEEDED FOR NAUSEA & VOMITING (Patient not taking: Reported on 10/05/2016) 30 tablet 0   No current facility-administered medications for this visit.    Facility-Administered Medications Ordered in Other Visits  Medication Dose Route Frequency Provider Last Rate Last Dose  . sodium chloride flush (NS) 0.9 % injection 10 mL  10 mL Intracatheter PRN Lloyd Huger, MD   10 mL at 02/10/16 0940    PHYSICAL EXAMINATION: ECOG PERFORMANCE STATUS: 0 - Asymptomatic  BP 121/80 (BP Location: Right Arm, Patient Position: Sitting)   Pulse 72   Temp 97.8 F (36.6 C) (Tympanic)   Resp 20   Ht 6' (1.829 m)   Wt 193 lb 6.4 oz (87.7 kg)   BMI 26.23 kg/m   Filed Weights   10/26/16 0843  Weight: 193 lb 6.4 oz (87.7 kg)    GENERAL: Well-nourished well-developed; Alert, no distress and comfortable.  Accompanied by caregiver. EYES: no pallor or icterus OROPHARYNX: no thrush or ulceration; good dentition  NECK: supple, no masses felt LYMPH:  no palpable lymphadenopathy in the cervical, axillary or inguinal regions LUNGS: clear to auscultation and  No wheeze or crackles HEART/CVS: regular rate & rhythm and no murmurs; No lower extremity edema ABDOMEN:abdomen soft, non-tender and normal bowel sounds Musculoskeletal:no cyanosis of digits and no clubbing; Bilateral hand swelling noted.  PSYCH: alert & oriented x 3 with fluent speech NEURO: no focal motor/sensory deficits SKIN:  no rashes or significant lesions     LABORATORY DATA:  I have reviewed the data as listed    Component Value Date/Time   NA 138 10/26/2016 0810   NA 135 07/28/2014 1121   K 4.3 10/26/2016 0810   K 4.3 07/28/2014 1121   CL 104 10/26/2016 0810   CL 101 07/28/2014 1121   CO2 27 10/26/2016 0810   CO2 25 07/28/2014  1121   GLUCOSE 91 10/26/2016 0810   GLUCOSE 135 (H) 07/28/2014 1121   BUN 13 10/26/2016 0810   BUN 19 07/28/2014 1121   CREATININE 0.90 10/26/2016 0810   CREATININE 1.36 (H) 07/28/2014 1121   CALCIUM 9.0 10/26/2016 0810   CALCIUM 9.1 07/28/2014 1121   PROT 6.1 (L) 10/26/2016 0810   PROT 7.6 07/23/2014 1042   ALBUMIN 3.3 (L) 10/26/2016 0810   ALBUMIN 4.2 07/23/2014 1042   AST 15 10/26/2016 0810   AST 24 07/23/2014 1042   ALT 10 (L) 10/26/2016 0810   ALT 24 07/23/2014 1042   ALKPHOS 69 10/26/2016 0810   ALKPHOS 108 07/23/2014 1042   BILITOT 0.4 10/26/2016 0810   BILITOT 0.5 07/23/2014 1042   GFRNONAA >60 10/26/2016  0810   GFRNONAA 57 (L) 07/28/2014 1121   GFRAA >60 10/26/2016 0810   GFRAA >60 07/28/2014 1121    No results found for: SPEP, UPEP  Lab Results  Component Value Date   WBC 7.1 10/26/2016   NEUTROABS 5.6 10/26/2016   HGB 10.4 (L) 10/26/2016   HCT 30.8 (L) 10/26/2016   MCV 84.4 10/26/2016   PLT 310 10/26/2016      Chemistry      Component Value Date/Time   NA 138 10/26/2016 0810   NA 135 07/28/2014 1121   K 4.3 10/26/2016 0810   K 4.3 07/28/2014 1121   CL 104 10/26/2016 0810   CL 101 07/28/2014 1121   CO2 27 10/26/2016 0810   CO2 25 07/28/2014 1121   BUN 13 10/26/2016 0810   BUN 19 07/28/2014 1121   CREATININE 0.90 10/26/2016 0810   CREATININE 1.36 (H) 07/28/2014 1121      Component Value Date/Time   CALCIUM 9.0 10/26/2016 0810   CALCIUM 9.1 07/28/2014 1121   ALKPHOS 69 10/26/2016 0810   ALKPHOS 108 07/23/2014 1042   AST 15 10/26/2016 0810   AST 24 07/23/2014 1042   ALT 10 (L) 10/26/2016 0810   ALT 24 07/23/2014 1042   BILITOT 0.4 10/26/2016 0810   BILITOT 0.5 07/23/2014 1042     IMPRESSION: 1. New small nodule in the RIGHT lower lobe. Recommend attention on follow-up with differential including infection versus neoplasm. 2. Stable postsurgical and radiation change in the RIGHT upper lobe. 3. Stable angular nodules in the LEFT lung  apex. 4. Diffuse esophageal wall thickening of the lower esophagus not changed comparison exam. Potential radiation esophagitis.   Electronically Signed   By: Suzy Bouchard M.D.   On: 07/12/2016 13:47 RADIOGRAPHIC STUDIES: I have personally reviewed the radiological images as listed and agreed with the findings in the report. No results found.   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  No concerns for clinical progression.  CT march 27th- Stable LUL lung nodules/ right stable radiation changes; new 5 mm nodule in RLL.   # HOLD  Tecentrqir today sec to hand swelling [se discussion below]; Labs today reviewed   # Bilateral hand swelling- ? Etiology  ? Sec to immunotherapy- X-rays of hands. Start prednisone 20mg /day x14 days. If not better; to call us.   # follow up in 3 weeks/labs/-MD-infusion; CT prior.    Orders Placed This Encounter  Procedures  . CT CHEST W CONTRAST    Standing Status:   Future    Standing Expiration Date:   12/26/2017    Order Specific Question:   Reason for Exam (SYMPTOM  OR DIAGNOSIS REQUIRED)    Answer:   lung cancer    Order Specific Question:   Preferred imaging location?    Answer:   [facility]  . CBC with Differential    Standing Status:   Future    Standing Expiration Date:   10/26/2017  . Comprehensive metabolic panel    Standing Status:   Future    Standing Expiration Date:   10/26/2017  . TSH    Standing Status:   Future    Standing Expiration Date:   10/26/2017   All questions were answered. The patient knows to call the clinic with any problems, questions or concerns.      [name], MD  10/26/2016 6:28 PM       [name], MD 10/26/2016 6:28 PM

## 2016-10-26 NOTE — Assessment & Plan Note (Addendum)
#   SQUAMOUS Cell LUNG CA- STAGE IV- [ tumor nodule in the contralateral lung/ recurrence]; Currently on tecentriq.  No concerns for clinical progression.  CT march 27th- Stable LUL lung nodules/ right stable radiation changes; new 5 mm nodule in RLL.   # HOLD  Tecentrqir today sec to hand swelling [se discussion below]; Labs today reviewed   # Bilateral hand swelling- ? Etiology  ? Sec to immunotherapy- X-rays of hands. Start prednisone 20mg /day x14 days. If not better; to call us.   # follow up in 3 weeks/labs/-MD-infusion; CT prior.

## 2016-11-02 ENCOUNTER — Other Ambulatory Visit: Payer: Self-pay | Admitting: Internal Medicine

## 2016-11-02 DIAGNOSIS — C3491 Malignant neoplasm of unspecified part of right bronchus or lung: Secondary | ICD-10-CM

## 2016-11-02 DIAGNOSIS — R112 Nausea with vomiting, unspecified: Secondary | ICD-10-CM

## 2016-11-14 ENCOUNTER — Ambulatory Visit
Admission: RE | Admit: 2016-11-14 | Discharge: 2016-11-14 | Disposition: A | Discharge status: Home / Self Care | Payer: Medicare Other | Source: Ambulatory Visit | Attending: Internal Medicine | Admitting: Internal Medicine

## 2016-11-14 DIAGNOSIS — I2510 Atherosclerotic heart disease of native coronary artery without angina pectoris: Secondary | ICD-10-CM | POA: Insufficient documentation

## 2016-11-14 DIAGNOSIS — C3401 Malignant neoplasm of right main bronchus: Secondary | ICD-10-CM | POA: Diagnosis not present

## 2016-11-14 DIAGNOSIS — I700 Atherosclerosis of aorta: Secondary | ICD-10-CM | POA: Insufficient documentation

## 2016-11-14 DIAGNOSIS — K229 Disease of esophagus, unspecified: Secondary | ICD-10-CM | POA: Diagnosis not present

## 2016-11-14 DIAGNOSIS — R918 Other nonspecific abnormal finding of lung field: Secondary | ICD-10-CM | POA: Diagnosis not present

## 2016-11-14 DIAGNOSIS — J90 Pleural effusion, not elsewhere classified: Secondary | ICD-10-CM | POA: Diagnosis not present

## 2016-11-14 DIAGNOSIS — J432 Centrilobular emphysema: Secondary | ICD-10-CM | POA: Diagnosis not present

## 2016-11-14 MED ORDER — IOPAMIDOL (ISOVUE-300) INJECTION 61%
75.0000 mL | Freq: Once | INTRAVENOUS | Status: AC | PRN
  Administered 2016-11-14: 75 mL via INTRAVENOUS

## 2016-11-16 ENCOUNTER — Telehealth: Payer: Self-pay | Admitting: *Deleted

## 2016-11-16 ENCOUNTER — Inpatient Hospital Stay: Payer: Medicare Other | Attending: Internal Medicine | Admitting: Internal Medicine

## 2016-11-16 ENCOUNTER — Inpatient Hospital Stay: Payer: Medicare Other

## 2016-11-16 ENCOUNTER — Telehealth: Payer: Self-pay | Admitting: Internal Medicine

## 2016-11-16 VITALS — BP 134/82 | HR 78 | Temp 97.8°F | Resp 18 | Ht 72.0 in | Wt 196.0 lb

## 2016-11-16 DIAGNOSIS — R5382 Chronic fatigue, unspecified: Secondary | ICD-10-CM | POA: Insufficient documentation

## 2016-11-16 DIAGNOSIS — E785 Hyperlipidemia, unspecified: Secondary | ICD-10-CM | POA: Diagnosis not present

## 2016-11-16 DIAGNOSIS — Z7689 Persons encountering health services in other specified circumstances: Secondary | ICD-10-CM | POA: Diagnosis not present

## 2016-11-16 DIAGNOSIS — Z86010 Personal history of colonic polyps: Secondary | ICD-10-CM | POA: Insufficient documentation

## 2016-11-16 DIAGNOSIS — M7989 Other specified soft tissue disorders: Secondary | ICD-10-CM | POA: Diagnosis not present

## 2016-11-16 DIAGNOSIS — J439 Emphysema, unspecified: Secondary | ICD-10-CM | POA: Diagnosis not present

## 2016-11-16 DIAGNOSIS — Z95828 Presence of other vascular implants and grafts: Secondary | ICD-10-CM

## 2016-11-16 DIAGNOSIS — R1310 Dysphagia, unspecified: Secondary | ICD-10-CM | POA: Diagnosis not present

## 2016-11-16 DIAGNOSIS — I10 Essential (primary) hypertension: Secondary | ICD-10-CM | POA: Insufficient documentation

## 2016-11-16 DIAGNOSIS — D649 Anemia, unspecified: Secondary | ICD-10-CM | POA: Insufficient documentation

## 2016-11-16 DIAGNOSIS — I700 Atherosclerosis of aorta: Secondary | ICD-10-CM

## 2016-11-16 DIAGNOSIS — M25562 Pain in left knee: Secondary | ICD-10-CM | POA: Diagnosis not present

## 2016-11-16 DIAGNOSIS — R600 Localized edema: Secondary | ICD-10-CM | POA: Insufficient documentation

## 2016-11-16 DIAGNOSIS — Z79899 Other long term (current) drug therapy: Secondary | ICD-10-CM | POA: Diagnosis not present

## 2016-11-16 DIAGNOSIS — I2510 Atherosclerotic heart disease of native coronary artery without angina pectoris: Secondary | ICD-10-CM | POA: Insufficient documentation

## 2016-11-16 DIAGNOSIS — C3401 Malignant neoplasm of right main bronchus: Secondary | ICD-10-CM | POA: Insufficient documentation

## 2016-11-16 DIAGNOSIS — Z7952 Long term (current) use of systemic steroids: Secondary | ICD-10-CM | POA: Insufficient documentation

## 2016-11-16 DIAGNOSIS — R911 Solitary pulmonary nodule: Secondary | ICD-10-CM

## 2016-11-16 DIAGNOSIS — E039 Hypothyroidism, unspecified: Secondary | ICD-10-CM | POA: Insufficient documentation

## 2016-11-16 DIAGNOSIS — M25561 Pain in right knee: Secondary | ICD-10-CM | POA: Insufficient documentation

## 2016-11-16 DIAGNOSIS — Z87891 Personal history of nicotine dependence: Secondary | ICD-10-CM | POA: Diagnosis not present

## 2016-11-16 DIAGNOSIS — F209 Schizophrenia, unspecified: Secondary | ICD-10-CM | POA: Diagnosis not present

## 2016-11-16 DIAGNOSIS — Z5111 Encounter for antineoplastic chemotherapy: Secondary | ICD-10-CM | POA: Diagnosis not present

## 2016-11-16 DIAGNOSIS — Z7189 Other specified counseling: Secondary | ICD-10-CM | POA: Insufficient documentation

## 2016-11-16 LAB — COMPREHENSIVE METABOLIC PANEL
ALBUMIN: 3.1 g/dL — AB (ref 3.5–5.0)
ALT: 18 U/L (ref 17–63)
AST: 17 U/L (ref 15–41)
Alkaline Phosphatase: 84 U/L (ref 38–126)
Anion gap: 7 (ref 5–15)
BILIRUBIN TOTAL: 0.4 mg/dL (ref 0.3–1.2)
BUN: 10 mg/dL (ref 6–20)
CHLORIDE: 104 mmol/L (ref 101–111)
CO2: 28 mmol/L (ref 22–32)
Calcium: 9 mg/dL (ref 8.9–10.3)
Creatinine, Ser: 0.8 mg/dL (ref 0.61–1.24)
GFR calc Af Amer: >60 mL/min (ref >60)
GFR calc non Af Amer: >60 mL/min (ref >60)
GLUCOSE: 92 mg/dL (ref 65–99)
POTASSIUM: 4.2 mmol/L (ref 3.5–5.1)
SODIUM: 139 mmol/L (ref 135–145)
Total Protein: 6.3 g/dL — ABNORMAL LOW (ref 6.5–8.1)

## 2016-11-16 LAB — CBC WITH DIFFERENTIAL/PLATELET
BASOS ABS: 0 10*3/uL (ref 0–0.1)
BASOS PCT: 0 %
EOS ABS: 0 10*3/uL (ref 0–0.7)
Eosinophils Relative: 0 %
HEMATOCRIT: 28.2 % — AB (ref 40.0–52.0)
Hemoglobin: 9.5 g/dL — ABNORMAL LOW (ref 13.0–18.0)
Lymphocytes Relative: 9 %
Lymphs Abs: 0.8 10*3/uL — ABNORMAL LOW (ref 1.0–3.6)
MCH: 27.5 pg (ref 26.0–34.0)
MCHC: 33.7 g/dL (ref 32.0–36.0)
MCV: 81.6 fL (ref 80.0–100.0)
MONO ABS: 0.8 10*3/uL (ref 0.2–1.0)
Monocytes Relative: 10 %
NEUTROS ABS: 7 10*3/uL — AB (ref 1.4–6.5)
Neutrophils Relative %: 81 %
PLATELETS: 380 10*3/uL (ref 150–440)
RBC: 3.45 MIL/uL — ABNORMAL LOW (ref 4.40–5.90)
RDW: 14.7 % — AB (ref 11.5–14.5)
WBC: 8.6 10*3/uL (ref 3.8–10.6)

## 2016-11-16 LAB — TSH: TSH: 2.034 u[IU]/mL (ref 0.350–4.500)

## 2016-11-16 MED ORDER — HEPARIN SOD (PORK) LOCK FLUSH 100 UNIT/ML IV SOLN
500.0000 [IU] | Freq: Once | INTRAVENOUS | Status: AC
  Administered 2016-11-16: 500 [IU] via INTRAVENOUS
  Filled 2016-11-16: qty 5

## 2016-11-16 MED ORDER — ONDANSETRON HCL 8 MG PO TABS: 8.0000 mg | ORAL_TABLET | Freq: Three times a day (TID) | ORAL | 1 refills | Status: DC | PRN

## 2016-11-16 MED ORDER — PREDNISONE 20 MG PO TABS: ORAL_TABLET | ORAL | 0 refills | Status: DC

## 2016-11-16 NOTE — Progress Notes (Signed)
[place] OFFICE PROGRESS NOTE  Patient Care Team: [name], MD as PCP - General (Internal Medicine)  [place] OFFICE PROGRESS NOTE  Patient Care Team: [name], MD as PCP - General (Internal Medicine)  Squamous cell carcinoma of right lung [facility])   Staging form: Lung, AJCC 7th Edition     Clinical stage from 07/17/2014: Stage IV (T4, N0, M1a) - Signed by Leia Alf, MD on 09/29/2014    Oncology History   # April 2016- SQUAMOUS CELL of LUNG ca [RUL] STAGE III; [EBUS-Right hilum];  (? Stage IV Left lung nodule- not Bx)   # FEB 2017-STAGE IV [contralateral lung nodules] PET Recurrence/Progression [high risk for [facility] 2017- START Tecentriq q 3W x3; April 2017 CT- LUL Stable/slight increase in size [~32mm]; new lingula ~68mm; improved RUL radiation changes; Improved basilar nodules ? Atypical or infectious etilogy; JUne 12th CT-STABLE LUL  # July 28th CT- Progression in lung;   # Schizophrenia/ group home resident     Malignant neoplasm of hilus of right lung ([place])     INTERVAL HISTORY: Poor historian given his schizophrenia. Accompanied by caregiver.  61 year old male patient with above history ofMetastatic squamous cell lung cancer is here for follow-up/ currently on Tecentriq- Is here for follow-up/ review the results of his restaging CAT scan.  Patient notes slight improvement of the swelling of his bilateral hands on prednisone. However he notes to have swelling of his bilateral knees/painful especially with movement. He is currently off prednisone.  No diarrhea. No shortness of breath or cough. No hemoptysis. No skin rash. No headaches or vision changes. Chronic mild difficulty swallowing   REVIEW OF SYSTEMS:  Difficult to assess given his poor history giving skills.  PAST MEDICAL HISTORY :  Past Medical History:  Diagnosis Date  . Chronic fatigue   . Colon polyps   . Hyperlipemia   . Hypertension   .  Hypothyroidism   . Schizophrenia ([place])   . Squamous cell carcinoma of right lung ([place]) 08/22/2014  . Wears dentures    full upper and lower    PAST SURGICAL HISTORY :   Past Surgical History:  Procedure Laterality Date  . COLONOSCOPY     history of colon polyps  . ENDOBRONCHIAL ULTRASOUND  07/30/2014   right hilum FNA/EBUS  . ESOPHAGOGASTRODUODENOSCOPY (EGD) WITH PROPOFOL N/A 08/15/2016   Procedure: ESOPHAGOGASTRODUODENOSCOPY (EGD) WITH PROPOFOL;  Surgeon: [name], MD;  Location: [place];  Service: Endoscopy;  Laterality: N/A;  . PERIPHERAL VASCULAR CATHETERIZATION N/A 08/21/2014   Procedure: Glori Luis Cath Insertion;  Surgeon: Algernon Huxley, MD;  Location: [place] CV LAB;  Service: Cardiovascular;  Laterality: N/A;    FAMILY HISTORY :  No family history on file.  SOCIAL HISTORY:   Social History  Substance Use Topics  . Smoking status: Former Smoker    Packs/day: 0.25    Years: 42.00    Types: Cigarettes    Quit date: 2015  . Smokeless tobacco: Never Used  . Alcohol use No    ALLERGIES:  has No Known Allergies.  MEDICATIONS:  Current Outpatient Prescriptions  Medication Sig Dispense Refill  . atorvastatin (LIPITOR) 10 MG tablet Take 10 mg by mouth every morning.     . calcium carbonate (TUMS - DOSED IN MG ELEMENTAL CALCIUM) 500 MG chewable tablet Chew 1 tablet by mouth 2 (two) times daily.    . ferrous sulfate 325 (65 FE) MG tablet Take 325 mg by mouth daily with breakfast.    .  levothyroxine (SYNTHROID, LEVOTHROID) 88 MCG tablet Take 88 mcg by mouth every morning.     Marland Kitchen lisinopril (PRINIVIL,ZESTRIL) 10 MG tablet Take 10 mg by mouth daily.    Marland Kitchen OLANZapine (ZYPREXA) 20 MG tablet Take 20 mg by mouth at bedtime.     . pantoprazole (PROTONIX) 40 MG tablet Take 1 tablet (40 mg total) by mouth daily. 30 tablet 11  . predniSONE (DELTASONE) 20 MG tablet 2 pills a day with food x 14 days; and then once a day with food. 21 tablet 0  . propranolol (INDERAL) 10 MG tablet  Take 10 mg by mouth 3 (three) times daily.    . sucralfate (CARAFATE) 1 g tablet Take 1 g by mouth 4 (four) times daily -  with meals and at bedtime.    . prochlorperazine (COMPAZINE) 10 MG tablet TAKE 1 TABLET BY MOUTH EVERY 6 HOURS AS NEEDED FOR NAUSEA & VOMITING (Patient not taking: Reported on 11/16/2016) 30 tablet 0   No current facility-administered medications for this visit.    Facility-Administered Medications Ordered in Other Visits  Medication Dose Route Frequency Provider Last Rate Last Dose  . sodium chloride flush (NS) 0.9 % injection 10 mL  10 mL Intracatheter PRN Lloyd Huger, MD   10 mL at 02/10/16 0940    PHYSICAL EXAMINATION: ECOG PERFORMANCE STATUS: 0 - Asymptomatic  BP 134/82 (BP Location: Right Arm, Patient Position: Sitting)   Pulse 78   Temp 97.8 F (36.6 C) (Tympanic)   Resp 18   Ht 6' (1.829 m)   Wt 196 lb (88.9 kg)   BMI 26.58 kg/m   Filed Weights   11/16/16 0855  Weight: 196 lb (88.9 kg)    GENERAL: Well-nourished well-developed; Alert, no distress and comfortable.  Accompanied by caregiver. EYES: no pallor or icterus OROPHARYNX: no thrush or ulceration; good dentition  NECK: supple, no masses felt LYMPH:  no palpable lymphadenopathy in the cervical, axillary or inguinal regions LUNGS: clear to auscultation and  No wheeze or crackles HEART/CVS: regular rate & rhythm and no murmurs; No lower extremity edema ABDOMEN:abdomen soft, non-tender and normal bowel sounds Musculoskeletal:no cyanosis of digits and no clubbing; Bilateral hand swelling noted/improved. Bilateral knee swelling noted. PSYCH: alert & oriented x 3 with fluent speech NEURO: no focal motor/sensory deficits SKIN:  no rashes or significant lesions     LABORATORY DATA:  I have reviewed the data as listed    Component Value Date/Time   NA 139 11/16/2016 0804   NA 135 07/28/2014 1121   K 4.2 11/16/2016 0804   K 4.3 07/28/2014 1121   CL 104 11/16/2016 0804   CL 101  07/28/2014 1121   CO2 28 11/16/2016 0804   CO2 25 07/28/2014 1121   GLUCOSE 92 11/16/2016 0804   GLUCOSE 135 (H) 07/28/2014 1121   BUN 10 11/16/2016 0804   BUN 19 07/28/2014 1121   CREATININE 0.80 11/16/2016 0804   CREATININE 1.36 (H) 07/28/2014 1121   CALCIUM 9.0 11/16/2016 0804   CALCIUM 9.1 07/28/2014 1121   PROT 6.3 (L) 11/16/2016 0804   PROT 7.6 07/23/2014 1042   ALBUMIN 3.1 (L) 11/16/2016 0804   ALBUMIN 4.2 07/23/2014 1042   AST 17 11/16/2016 0804   AST 24 07/23/2014 1042   ALT 18 11/16/2016 0804   ALT 24 07/23/2014 1042   ALKPHOS 84 11/16/2016 0804   ALKPHOS 108 07/23/2014 1042   BILITOT 0.4 11/16/2016 0804   BILITOT 0.5 07/23/2014 1042   GFRNONAA >60 11/16/2016 0804  GFRNONAA 57 (L) 07/28/2014 1121   GFRAA >60 11/16/2016 0804   GFRAA >60 07/28/2014 1121    No results found for: SPEP, UPEP  Lab Results  Component Value Date   WBC 8.6 11/16/2016   NEUTROABS 7.0 (H) 11/16/2016   HGB 9.5 (L) 11/16/2016   HCT 28.2 (L) 11/16/2016   MCV 81.6 11/16/2016   PLT 380 11/16/2016      Chemistry      Component Value Date/Time   NA 139 11/16/2016 0804   NA 135 07/28/2014 1121   K 4.2 11/16/2016 0804   K 4.3 07/28/2014 1121   CL 104 11/16/2016 0804   CL 101 07/28/2014 1121   CO2 28 11/16/2016 0804   CO2 25 07/28/2014 1121   BUN 10 11/16/2016 0804   BUN 19 07/28/2014 1121   CREATININE 0.80 11/16/2016 0804   CREATININE 1.36 (H) 07/28/2014 1121      Component Value Date/Time   CALCIUM 9.0 11/16/2016 0804   CALCIUM 9.1 07/28/2014 1121   ALKPHOS 84 11/16/2016 0804   ALKPHOS 108 07/23/2014 1042   AST 17 11/16/2016 0804   AST 24 07/23/2014 1042   ALT 18 11/16/2016 0804   ALT 24 07/23/2014 1042   BILITOT 0.4 11/16/2016 0804   BILITOT 0.5 07/23/2014 1042     IIMPRESSION: 1. Today's study is highly concerning for recurrent disease with enlarging mass-like areas in the right upper lobe, and an enlarging nodule with developing satellite nodule in the left upper  lobe near the apex, as detailed above. There is also a mildly enlarged right hilar lymph node, and a new small right-sided pleural effusion. 2. Persistent circumferential thickening of the distal third of the esophagus. This may simply reflect esophagitis either from reflux or radiation therapy, however, if there is any clinical concern for Barrett's metaplasia or esophageal neoplasia, further evaluation with endoscopy could be considered. 3. Aortic atherosclerosis, in addition to three-vessel coronary artery disease. Please note that although the presence of coronary artery calcium documents the presence of coronary artery disease, the severity of this disease and any potential stenosis cannot be assessed on this non-gated CT examination. Assessment for potential risk factor modification, dietary therapy or pharmacologic therapy may be warranted, if clinically indicated. 4. Diffuse bronchial wall thickening with moderate to severe centrilobular and paraseptal emphysema; imaging findings suggestive of underlying COPD. 5. Additional incidental findings, as above.  Aortic Atherosclerosis (ICD10-I70.0) and Emphysema (ICD10-J43.9).   Electronically Signed   By: Vinnie Langton M.D.   On: 11/14/2016 09:23  RADIOGRAPHIC STUDIES: I have personally reviewed the radiological images as listed and agreed with the findings in the report. No results found.   ASSESSMENT & PLAN:  Malignant neoplasm of hilus of right lung ([place] [facility]) # SQUAMOUS Cell LUNG CA- STAGE IV- [ tumor nodule in the contralateral lung/ recurrence]; Currently on tecentriq.  CT July 28th -Progression on Right UL/hilar mass; and also LUL nodules.   # Patient had interruptions of treatment/Tecentriq- question hand swelling and currently knee swelling. On steroids. Long discussion with the patient's caregiver- regarding progression of disease; in the context of intolerance to therapy.   # I would recommend switching the therapy  to chemotherapy- carbotaxol. She had significant concerns about this previous poor tolerance to therapy. Also check Foundation One.   # Discussed the potential side effects including but not limited to-increasing fatigue, nausea vomiting, diarrhea, hair loss, sores in the mouth, increase risk of infection and also neuropathy.   # Bilateral hand swelling/bil Knee pain-  likely Sec to immunotherapy; restart prednisone 40 mg for 2 weeks and then 20 mg once a day for one week.  # follow up to be decided; care giver will call re: treatment plan.  # I reviewed the blood work- with the patient in detail; also reviewed the imaging independently [as summarized above]; and with the patient in detail.    Addendum: Caregiver called back Is patent patient's mom- that agreeable to proceed with chemotherapy. Will start chemo next week.    No orders of the defined types were placed in this encounter.  All questions were answered. The patient knows to call the clinic with any problems, questions or concerns.      [name], MD 11/16/2016 6:22 PM       [name], MD 11/16/2016 6:22 PM

## 2016-11-16 NOTE — Progress Notes (Signed)
START ON PATHWAY REGIMEN - Non-Small Cell Lung     A cycle is every 21 days:     Paclitaxel      Carboplatin   **Always confirm dose/schedule in your pharmacy ordering system**    Patient Characteristics: Stage IV Metastatic, Squamous, PS = 0, 1, Second Line, Prior PD-1 / PD-L1 Inhibitor AJCC T Category: T4 Current Disease Status: Distant Metastases AJCC N Category: N2 AJCC M Category: M1 AJCC 8 Stage Grouping: IV Histology: Squamous Cell Line of therapy: Second Line PD-L1 Expression Status: Quantity Not Sufficient Performance Status: PS = 0, 1 Would you be surprised if this patient died  in the next year? I would be surprised if this patient died in the next year Immunotherapy Candidate Status: Not a Candidate for Immunotherapy Prior Immunotherapy Status: Prior PD-1/PD-L1 Inhibitor Intent of Therapy: Non-Curative / Palliative Intent, Discussed with Patient

## 2016-11-16 NOTE — Telephone Encounter (Signed)
I have put in the orders for carbo-taxol every 3 weeks; follow up in 1 week/carbo-taxol-MD; No labs. Please inform care giver.

## 2016-11-16 NOTE — Progress Notes (Signed)
Patient here to follow-up for h/o of lung ca. Here for tencentriq today. Pt recovered recently from "head cold." pt reports new onset of joint stiffness in knees. He has no other medical complaints.

## 2016-11-16 NOTE — Assessment & Plan Note (Addendum)
#   SQUAMOUS Cell LUNG CA- STAGE IV- [ tumor nodule in the contralateral lung/ recurrence]; Currently on tecentriq.  CT July 28th -Progression on Right UL/hilar mass; and also LUL nodules.   # Patient had interruptions of treatment/Tecentriq- question hand swelling and currently knee swelling. On steroids. Long discussion with the patient's caregiver- regarding progression of disease; in the context of intolerance to therapy.   # I would recommend switching the therapy to chemotherapy- carbotaxol. She had significant concerns about this previous poor tolerance to therapy. Also check Foundation One.   # Discussed the potential side effects including but not limited to-increasing fatigue, nausea vomiting, diarrhea, hair loss, sores in the mouth, increase risk of infection and also neuropathy.   # Bilateral hand swelling/bil Knee pain- likely Sec to immunotherapy; restart prednisone 40 mg for 2 weeks and then 20 mg once a day for one week.  # follow up to be decided; care giver will call re: treatment plan.  # I reviewed the blood work- with the patient in detail; also reviewed the imaging independently [as summarized above]; and with the patient in detail.   Addendum: Caregiver called back Is patent patient's mom- that agreeable to proceed with chemotherapy. Will start chemo next week.

## 2016-11-16 NOTE — Telephone Encounter (Signed)
Joyice Faster called to notify MD that the patient's guardian had given verbal consent for him to receive treatment. She stated that she understood that they would be contacted for next steps and appointment.

## 2016-11-16 NOTE — Telephone Encounter (Signed)
Acknowledge. md will review chart and determine tx plan. Pt/caregiver to be notified of next apt.

## 2016-11-17 NOTE — Telephone Encounter (Signed)
msg sent to sch. Team to arrange for apts.

## 2016-11-18 ENCOUNTER — Telehealth: Payer: Self-pay | Admitting: *Deleted

## 2016-11-18 NOTE — Telephone Encounter (Signed)
msg sent to sch. Team - ok to schedule on 8/10

## 2016-11-18 NOTE — Telephone Encounter (Signed)
-----   Message from [place] sent at 11/18/2016 10:58 AM EDT ----- Caregiver stated he is scheduled for a colonoscopy on 8/9 and is scheduled for tx on 8/8, wants to know does he need to r/s his tx due to the colonoscopy. Worried the patient will be back and forth to the bathroom.

## 2016-11-23 ENCOUNTER — Inpatient Hospital Stay: Payer: Medicare Other | Admitting: Internal Medicine

## 2016-11-23 ENCOUNTER — Inpatient Hospital Stay: Payer: Medicare Other

## 2016-11-24 ENCOUNTER — Ambulatory Visit
Admission: RE | Admit: 2016-11-24 | Discharge: 2016-11-24 | Disposition: A | Discharge status: Home / Self Care | Payer: Medicare Other | Source: Ambulatory Visit | Attending: Gastroenterology | Admitting: Gastroenterology

## 2016-11-24 ENCOUNTER — Ambulatory Visit: Payer: Medicare Other | Admitting: Anesthesiology

## 2016-11-24 ENCOUNTER — Encounter
Admission: RE | Disposition: A | Discharge status: Home / Self Care | Payer: Self-pay | Source: Ambulatory Visit | Attending: Gastroenterology

## 2016-11-24 ENCOUNTER — Encounter: Payer: Self-pay | Admitting: *Deleted

## 2016-11-24 DIAGNOSIS — I10 Essential (primary) hypertension: Secondary | ICD-10-CM | POA: Insufficient documentation

## 2016-11-24 DIAGNOSIS — Z1211 Encounter for screening for malignant neoplasm of colon: Secondary | ICD-10-CM | POA: Insufficient documentation

## 2016-11-24 DIAGNOSIS — Z87891 Personal history of nicotine dependence: Secondary | ICD-10-CM | POA: Diagnosis not present

## 2016-11-24 DIAGNOSIS — K640 First degree hemorrhoids: Secondary | ICD-10-CM | POA: Diagnosis not present

## 2016-11-24 DIAGNOSIS — Z79899 Other long term (current) drug therapy: Secondary | ICD-10-CM | POA: Diagnosis not present

## 2016-11-24 DIAGNOSIS — E785 Hyperlipidemia, unspecified: Secondary | ICD-10-CM | POA: Diagnosis not present

## 2016-11-24 DIAGNOSIS — E039 Hypothyroidism, unspecified: Secondary | ICD-10-CM | POA: Insufficient documentation

## 2016-11-24 DIAGNOSIS — F209 Schizophrenia, unspecified: Secondary | ICD-10-CM | POA: Diagnosis not present

## 2016-11-24 DIAGNOSIS — Z86010 Personal history of colonic polyps: Secondary | ICD-10-CM | POA: Diagnosis not present

## 2016-11-24 DIAGNOSIS — Z85118 Personal history of other malignant neoplasm of bronchus and lung: Secondary | ICD-10-CM | POA: Insufficient documentation

## 2016-11-24 HISTORY — PX: COLONOSCOPY WITH PROPOFOL: SHX5780

## 2016-11-24 SURGERY — COLONOSCOPY WITH PROPOFOL
Anesthesia: General

## 2016-11-24 MED ORDER — MIDAZOLAM HCL 2 MG/2ML IJ SOLN
INTRAMUSCULAR | Status: DC | PRN
  Administered 2016-11-24: 2 mg via INTRAVENOUS

## 2016-11-24 MED ORDER — EPHEDRINE SULFATE 50 MG/ML IJ SOLN
INTRAMUSCULAR | Status: AC
  Filled 2016-11-24: qty 1

## 2016-11-24 MED ORDER — HEPARIN SOD (PORK) LOCK FLUSH 100 UNIT/ML IV SOLN
500.0000 [IU] | INTRAVENOUS | Status: AC | PRN
  Administered 2016-11-24: 500 [IU]

## 2016-11-24 MED ORDER — FENTANYL CITRATE (PF) 100 MCG/2ML IJ SOLN
INTRAMUSCULAR | Status: DC | PRN
  Administered 2016-11-24: 50 ug via INTRAVENOUS

## 2016-11-24 MED ORDER — HEPARIN SOD (PORK) LOCK FLUSH 100 UNIT/ML IV SOLN
INTRAVENOUS | Status: AC
  Filled 2016-11-24: qty 5

## 2016-11-24 MED ORDER — SODIUM CHLORIDE 0.9% FLUSH
10.0000 mL | INTRAVENOUS | Status: AC | PRN
  Administered 2016-11-24: 10 mL

## 2016-11-24 MED ORDER — MIDAZOLAM HCL 2 MG/2ML IJ SOLN
INTRAMUSCULAR | Status: AC
  Filled 2016-11-24: qty 2

## 2016-11-24 MED ORDER — SODIUM CHLORIDE 0.9 % IV SOLN
INTRAVENOUS | Status: DC
  Administered 2016-11-24: 09:00:00 via INTRAVENOUS

## 2016-11-24 MED ORDER — EPHEDRINE SULFATE 50 MG/ML IJ SOLN
INTRAMUSCULAR | Status: DC | PRN
  Administered 2016-11-24 (×2): 10 mg via INTRAVENOUS

## 2016-11-24 MED ORDER — PROPOFOL 500 MG/50ML IV EMUL
INTRAVENOUS | Status: AC
  Filled 2016-11-24: qty 50

## 2016-11-24 MED ORDER — FENTANYL CITRATE (PF) 100 MCG/2ML IJ SOLN
INTRAMUSCULAR | Status: AC
  Filled 2016-11-24: qty 2

## 2016-11-24 MED ORDER — PROPOFOL 500 MG/50ML IV EMUL
INTRAVENOUS | Status: DC | PRN
  Administered 2016-11-24: 120 ug/kg/min via INTRAVENOUS

## 2016-11-24 NOTE — Anesthesia Post-op Follow-up Note (Signed)
Anesthesia QCDR form completed.        

## 2016-11-24 NOTE — Anesthesia Preprocedure Evaluation (Signed)
Anesthesia Evaluation  Patient identified by MRN, date of birth, ID band Patient awake    Reviewed: Allergy & Precautions, H&P , NPO status , Patient's Chart, lab work & pertinent test results  History of Anesthesia Complications Negative for: history of anesthetic complications  Airway Mallampati: III  TM Distance: <3 FB Neck ROM: limited    Dental  (+) Poor Dentition, Missing, Upper Dentures, Lower Dentures   Pulmonary neg shortness of breath, former smoker,           Cardiovascular Exercise Tolerance: Poor hypertension, (-) angina(-) Past MI      Neuro/Psych PSYCHIATRIC DISORDERS Schizophrenia negative neurological ROS  negative psych ROS   GI/Hepatic Neg liver ROS, PUD,   Endo/Other  Hypothyroidism   Renal/GU negative Renal ROS  negative genitourinary   Musculoskeletal   Abdominal   Peds  Hematology negative hematology ROS (+)   Anesthesia Other Findings Past Medical History: No date: Chronic fatigue No date: Colon polyps No date: Hyperlipemia No date: Hypertension No date: Hypothyroidism No date: Schizophrenia ([place]) 08/22/2014: Squamous cell carcinoma of right lung ([facility]) No date: Wears dentures     Comment:  full upper and lower  Past Surgical History: No date: COLONOSCOPY     Comment:  history of colon polyps 07/30/2014: ENDOBRONCHIAL ULTRASOUND     Comment:  right hilum FNA/EBUS 08/15/2016: ESOPHAGOGASTRODUODENOSCOPY (EGD) WITH PROPOFOL; N/A     Comment:  Procedure: ESOPHAGOGASTRODUODENOSCOPY (EGD) WITH               PROPOFOL;  Surgeon: [name], MD;  Location: [place];  Service: Endoscopy;  Laterality: N/A; 08/21/2014: PERIPHERAL VASCULAR CATHETERIZATION; N/A     Comment:  Procedure: Porta Cath Insertion;  Surgeon: Algernon Huxley,               MD;  Location: [place] [facility] LAB;  Service:               Cardiovascular;  Laterality: N/A;  BMI    Body Mass Index:  26.45  kg/m      Reproductive/Obstetrics negative OB ROS                             Anesthesia Physical Anesthesia Plan  ASA: III  Anesthesia Plan: General   Post-op Pain Management:    Induction: Intravenous  PONV Risk Score and Plan:   Airway Management Planned: Natural Airway and Nasal Cannula  Additional Equipment:   Intra-op Plan:   Post-operative Plan:   Informed Consent: I have reviewed the patients History and Physical, chart, labs and discussed the procedure including the risks, benefits and alternatives for the proposed anesthesia with the patient or authorized representative who has indicated his/her understanding and acceptance.   Dental Advisory Given  Plan Discussed with: Anesthesiologist, CRNA and Surgeon  Anesthesia Plan Comments: (Patient has very flat affect, he and caregiver say that this is his baseline   Patient consented for risks of anesthesia including but not limited to:  - adverse reactions to medications - risk of intubation if required - damage to teeth, lips or other oral mucosa - sore throat or hoarseness - Damage to heart, brain, lungs or loss of life  Patient voiced understanding.)        Anesthesia Quick Evaluation

## 2016-11-24 NOTE — Anesthesia Procedure Notes (Signed)
Performed by: Vaughan Sine Pre-anesthesia Checklist: Patient identified, Emergency Drugs available, Suction available, Patient being monitored and Timeout performed Patient Re-evaluated:Patient Re-evaluated prior to induction Oxygen Delivery Method: Nasal cannula Preoxygenation: Pre-oxygenation with 100% oxygen Induction Type: IV induction Ventilation: Oral airway inserted - appropriate to patient size Placement Confirmation: positive ETCO2 and CO2 detector

## 2016-11-24 NOTE — Anesthesia Postprocedure Evaluation (Signed)
Anesthesia Post Note  Patient: Travis Maddox  Procedure(s) Performed: Procedure(s) (LRB): COLONOSCOPY WITH PROPOFOL (N/A)  Patient location during evaluation: Endoscopy Anesthesia Type: General Level of consciousness: awake and alert Pain management: pain level controlled Vital Signs Assessment: post-procedure vital signs reviewed and stable Respiratory status: spontaneous breathing, nonlabored ventilation, respiratory function stable and patient connected to nasal cannula oxygen Cardiovascular status: blood pressure returned to baseline and stable Postop Assessment: no signs of nausea or vomiting Anesthetic complications: no     Last Vitals:  Vitals:   11/24/16 1000 11/24/16 1010  BP: 135/75 (!) 154/70  Pulse: 73   Resp: 14   Temp:    SpO2: 96%     Last Pain:  Vitals:   11/24/16 0940  TempSrc: Tympanic                 Precious Haws Haakon T[place]worth

## 2016-11-24 NOTE — Op Note (Signed)
[facility] Patient Name: Travis Maddox Procedure Date: 11/24/2016 9:15 AM MRN: [id number] Account #: [id number] Date of Birth: 1956-03-15 Admit Type: Outpatient Age: 61 Room: [facility] ENDO ROOM 4 Gender: Male Note Status: Finalized Procedure:            Colonoscopy Indications:          High risk colon cancer surveillance: Personal history                        of colonic polyps Providers:            Jonathon Bellows MD, MD Referring MD:         [name], MD (Referring MD) Medicines:            Monitored Anesthesia Care Complications:        No immediate complications. Procedure:            Pre-Anesthesia Assessment:                       - Prior to the procedure, a History and Physical was                        performed, and patient medications, allergies and                        sensitivities were reviewed. The patient's tolerance of                        previous anesthesia was reviewed.                       - The risks and benefits of the procedure and the                        sedation options and risks were discussed with the                        patient. All questions were answered and informed                        consent was obtained.                       - ASA Grade Assessment: III - A patient with severe                        systemic disease.                       - ASA Grade Assessment: III - A patient with severe                        systemic disease.                       After obtaining informed consent, the colonoscope was                        passed under direct vision. Throughout the procedure,                        the patient's  blood pressure, pulse, and oxygen                        saturations were monitored continuously. The                        Colonoscope was introduced through the anus and                        advanced to the the cecum, identified by the                        appendiceal orifice, IC  valve and transillumination.                        The colonoscopy was performed with ease. The patient                        tolerated the procedure well. The quality of the bowel                        preparation was good. Findings:      Non-bleeding internal hemorrhoids were found during retroflexion. The       hemorrhoids were medium-sized and Grade I (internal hemorrhoids that do       not prolapse).      The exam was otherwise without abnormality on direct and retroflexion       views. Impression:           - Non-bleeding internal hemorrhoids.                       - The examination was otherwise normal on direct and                        retroflexion views.                       - No specimens collected. Recommendation:       - Discharge patient to home (with escort).                       - Discharge patient to home (with escort).                       - Resume previous diet.                       - Continue present medications. Procedure Code(s):    --- Professional ---                       U2725, Colorectal cancer screening; colonoscopy on                        individual at high risk Diagnosis Code(s):    --- Professional ---                       Z86.010, Personal history of colonic polyps                       K64.0, First degree hemorrhoids CPT copyright 2016 American Medical Association. All rights reserved. The  codes documented in this report are preliminary and upon coder review may  be revised to meet current compliance requirements. Jonathon Bellows, MD Jonathon Bellows MD, MD 11/24/2016 9:44:39 AM This report has been signed electronically. Number of Addenda: 0 Note Initiated On: 11/24/2016 9:15 AM Scope Withdrawal Time: 0 hours 18 minutes 59 seconds  Total Procedure Duration: 0 hours 21 minutes 50 seconds       [facility]

## 2016-11-24 NOTE — Transfer of Care (Signed)
Immediate Anesthesia Transfer of Care Note  Patient: Travis Maddox  Procedure(s) Performed: Procedure(s): COLONOSCOPY WITH PROPOFOL (N/A)  Patient Location: PACU  Anesthesia Type:General  Level of Consciousness: awake and sedated  Airway & Oxygen Therapy: Patient Spontanous Breathing and Patient connected to nasal cannula oxygen  Post-op Assessment: Report given to RN and Post -op Vital signs reviewed and stable  Post vital signs: Reviewed and stable  Last Vitals:  Vitals:   11/24/16 0830  BP: (!) 156/86  Pulse: 70  Resp: 18  Temp: (!) 35.9 C  SpO2: 97%    Last Pain:  Vitals:   11/24/16 0830  TempSrc: Tympanic         Complications: No apparent anesthesia complications

## 2016-11-24 NOTE — H&P (Signed)
Jonathon Bellows MD [address], [place], [place] [address] Phone: [phone] Fax : [phone]  Primary Care Physician:  [name], MD Primary Gastroenterologist:  Dr. Jonathon Bellows   Pre-Procedure History & Physical: HPI:  Travis Maddox is a 61 y.o. male is here for an colonoscopy.   Past Medical History:  Diagnosis Date  . Chronic fatigue   . Colon polyps   . Hyperlipemia   . Hypertension   . Hypothyroidism   . Schizophrenia ([place])   . 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 Wears dentures    full upper and lower    Past Surgical History:  Procedure Laterality Date  . COLONOSCOPY     history of colon polyps  . ENDOBRONCHIAL ULTRASOUND  07/30/2014   right hilum FNA/EBUS  . ESOPHAGOGASTRODUODENOSCOPY (EGD) WITH PROPOFOL N/A 08/15/2016   Procedure: ESOPHAGOGASTRODUODENOSCOPY (EGD) WITH PROPOFOL;  Surgeon: [name], MD;  Location: [facility];  Service: Endoscopy;  Laterality: N/A;  . PERIPHERAL VASCULAR CATHETERIZATION N/A 08/21/2014   Procedure: Glori Luis Cath Insertion;  Surgeon: Algernon Huxley, MD;  Location: [facility];  Service: Cardiovascular;  Laterality: N/A;    Prior to Admission medications   Medication Sig Start Date End Date Taking? Authorizing Provider  atorvastatin (LIPITOR) 10 MG tablet Take 10 mg by mouth every morning.    Yes [provider]  calcium carbonate (TUMS - DOSED IN MG ELEMENTAL CALCIUM) 500 MG chewable tablet Chew 1 tablet by mouth 2 (two) times daily.   Yes [provider]  ferrous sulfate 325 (65 FE) MG tablet Take 325 mg by mouth daily with breakfast.   Yes [provider]  levothyroxine (SYNTHROID, LEVOTHROID) 88 MCG tablet Take 88 mcg by mouth every morning.    Yes [provider]  lisinopril (PRINIVIL,ZESTRIL) 10 MG tablet Take 10 mg by mouth daily.   Yes [provider]  OLANZapine (ZYPREXA) 20 MG tablet Take 20 mg by mouth at bedtime.    Yes [provider]  ondansetron (ZOFRAN) 8 MG tablet Take 1 tablet (8 mg total) by mouth every 8 (eight) hours as needed for nausea or vomiting (start 3 days; after chemo). 11/16/16  Yes [name], MD  pantoprazole (PROTONIX) 40 MG tablet Take 1 tablet (40 mg total) by mouth daily. 08/15/16  Yes [name], MD  propranolol (INDERAL) 10 MG tablet Take 10 mg by mouth 3 (three) times daily.   Yes [provider]  sucralfate (CARAFATE) 1 g tablet Take 1 g by mouth 4 (four) times daily -  with meals and at bedtime.   Yes [provider]  predniSONE (DELTASONE) 20 MG tablet 2 pills a day with food x 14 days; and then once a day with food. Patient not taking: Reported on 11/24/2016 11/16/16   [name], MD  prochlorperazine (COMPAZINE) 10 MG tablet TAKE 1 TABLET BY MOUTH EVERY 6 HOURS AS NEEDED FOR NAUSEA & VOMITING Patient not taking: Reported on 11/16/2016 11/02/16   [name], MD    Allergies as of 10/14/2016  . (No Known Allergies)    History reviewed. No pertinent family history.  Social History   Social History  . Marital status: Widowed    Spouse name: N/A  . Number of children: N/A  . Years of education: N/A   Occupational History  . Not on file.   Social History Main Topics  . Smoking status: Former Smoker    Packs/day:  0.25    Years: 42.00    Types: Cigarettes    Quit date: 2015  . Smokeless tobacco: Never Used  . Alcohol use No  . Drug use: No  . Sexual activity: Not on file   Other Topics Concern  . Not on file   Social History Narrative  . No narrative on file    Review of Systems: See HPI, otherwise negative ROS  Physical Exam: BP (!) 156/86   Pulse 70   Temp (!) 96.6 F (35.9 C) (Tympanic)   Resp 18   Ht 6' (1.829 m)   Wt 195 lb (88.5 kg)   SpO2 97%   BMI 26.45 kg/m  General:   Alert,  pleasant and cooperative in NAD Head:  Normocephalic and atraumatic. Neck:  Supple; no masses or thyromegaly. Lungs:   Clear throughout to auscultation.    Heart:  Regular rate and rhythm. Abdomen:  Soft, nontender and nondistended. Normal bowel sounds, without guarding, and without rebound.   Neurologic:  Alert and  oriented x4;  grossly normal neurologically.  Impression/Plan: Travis Maddox is here for an colonoscopy to be performed for Surveillance due to prior history of colon polyps    Risks, benefits, limitations, and alternatives regarding  colonoscopy have been reviewed with the patient.  Questions have been answered.  All parties agreeable.   Jonathon Bellows, MD  11/24/2016, 9:12 AM

## 2016-11-25 ENCOUNTER — Inpatient Hospital Stay (HOSPITAL_BASED_OUTPATIENT_CLINIC_OR_DEPARTMENT_OTHER): Payer: Medicare Other | Admitting: Internal Medicine

## 2016-11-25 ENCOUNTER — Encounter: Payer: Self-pay | Admitting: Gastroenterology

## 2016-11-25 ENCOUNTER — Inpatient Hospital Stay: Payer: Medicare Other

## 2016-11-25 VITALS — BP 138/83 | HR 90 | Temp 97.6°F | Resp 16 | Wt 199.2 lb

## 2016-11-25 VITALS — BP 143/87 | HR 77 | Temp 97.1°F | Resp 20

## 2016-11-25 DIAGNOSIS — Z86010 Personal history of colonic polyps: Secondary | ICD-10-CM

## 2016-11-25 DIAGNOSIS — I10 Essential (primary) hypertension: Secondary | ICD-10-CM | POA: Diagnosis not present

## 2016-11-25 DIAGNOSIS — F209 Schizophrenia, unspecified: Secondary | ICD-10-CM | POA: Diagnosis not present

## 2016-11-25 DIAGNOSIS — E785 Hyperlipidemia, unspecified: Secondary | ICD-10-CM | POA: Diagnosis not present

## 2016-11-25 DIAGNOSIS — E039 Hypothyroidism, unspecified: Secondary | ICD-10-CM | POA: Diagnosis not present

## 2016-11-25 DIAGNOSIS — R5382 Chronic fatigue, unspecified: Secondary | ICD-10-CM

## 2016-11-25 DIAGNOSIS — M7989 Other specified soft tissue disorders: Secondary | ICD-10-CM

## 2016-11-25 DIAGNOSIS — I700 Atherosclerosis of aorta: Secondary | ICD-10-CM

## 2016-11-25 DIAGNOSIS — Z79899 Other long term (current) drug therapy: Secondary | ICD-10-CM

## 2016-11-25 DIAGNOSIS — I2510 Atherosclerotic heart disease of native coronary artery without angina pectoris: Secondary | ICD-10-CM

## 2016-11-25 DIAGNOSIS — R1310 Dysphagia, unspecified: Secondary | ICD-10-CM

## 2016-11-25 DIAGNOSIS — J439 Emphysema, unspecified: Secondary | ICD-10-CM

## 2016-11-25 DIAGNOSIS — C3401 Malignant neoplasm of right main bronchus: Secondary | ICD-10-CM

## 2016-11-25 DIAGNOSIS — Z5111 Encounter for antineoplastic chemotherapy: Secondary | ICD-10-CM | POA: Diagnosis not present

## 2016-11-25 DIAGNOSIS — Z87891 Personal history of nicotine dependence: Secondary | ICD-10-CM

## 2016-11-25 DIAGNOSIS — Z7952 Long term (current) use of systemic steroids: Secondary | ICD-10-CM

## 2016-11-25 DIAGNOSIS — R911 Solitary pulmonary nodule: Secondary | ICD-10-CM

## 2016-11-25 MED ORDER — FAMOTIDINE IN NACL 20-0.9 MG/50ML-% IV SOLN
20.0000 mg | Freq: Once | INTRAVENOUS | Status: AC
  Administered 2016-11-25: 20 mg via INTRAVENOUS
  Filled 2016-11-25: qty 50

## 2016-11-25 MED ORDER — SODIUM CHLORIDE 0.9 % IV SOLN
740.0000 mg | Freq: Once | INTRAVENOUS | Status: AC
  Administered 2016-11-25: 740 mg via INTRAVENOUS
  Filled 2016-11-25: qty 74

## 2016-11-25 MED ORDER — SODIUM CHLORIDE 0.9 % IV SOLN
20.0000 mg | Freq: Once | INTRAVENOUS | Status: AC
  Administered 2016-11-25: 20 mg via INTRAVENOUS
  Filled 2016-11-25: qty 2

## 2016-11-25 MED ORDER — SODIUM CHLORIDE 0.9 % IV SOLN
200.0000 mg/m2 | Freq: Once | INTRAVENOUS | Status: AC
  Administered 2016-11-25: 426 mg via INTRAVENOUS
  Filled 2016-11-25: qty 71

## 2016-11-25 MED ORDER — HEPARIN SOD (PORK) LOCK FLUSH 100 UNIT/ML IV SOLN
500.0000 [IU] | Freq: Once | INTRAVENOUS | Status: AC | PRN
  Administered 2016-11-25: 500 [IU]
  Filled 2016-11-25: qty 5

## 2016-11-25 MED ORDER — PALONOSETRON HCL INJECTION 0.25 MG/5ML
0.2500 mg | Freq: Once | INTRAVENOUS | Status: AC
  Administered 2016-11-25: 0.25 mg via INTRAVENOUS
  Filled 2016-11-25: qty 5

## 2016-11-25 MED ORDER — DIPHENHYDRAMINE HCL 50 MG/ML IJ SOLN
50.0000 mg | Freq: Once | INTRAMUSCULAR | Status: AC
  Administered 2016-11-25: 50 mg via INTRAVENOUS
  Filled 2016-11-25: qty 1

## 2016-11-25 MED ORDER — PEGFILGRASTIM 6 MG/0.6ML ~~LOC~~ PSKT
6.0000 mg | PREFILLED_SYRINGE | Freq: Once | SUBCUTANEOUS | Status: AC
  Administered 2016-11-25: 6 mg via SUBCUTANEOUS
  Filled 2016-11-25: qty 0.6

## 2016-11-25 MED ORDER — SODIUM CHLORIDE 0.9 % IV SOLN
Freq: Once | INTRAVENOUS | Status: AC
  Administered 2016-11-25: 11:00:00 via INTRAVENOUS
  Filled 2016-11-25: qty 1000

## 2016-11-25 MED ORDER — SODIUM CHLORIDE 0.9% FLUSH
10.0000 mL | INTRAVENOUS | Status: DC | PRN
  Administered 2016-11-25: 10 mL
  Filled 2016-11-25: qty 10

## 2016-11-25 NOTE — Progress Notes (Signed)
Patient is here today for a follow up. Patient states no new concerns today.  

## 2016-11-25 NOTE — Progress Notes (Signed)
[facility] OFFICE PROGRESS NOTE  Patient Care Team: [name], MD as PCP - General (Internal Medicine)  [facility] OFFICE PROGRESS NOTE  Patient Care Team: [name], MD as PCP - General (Internal Medicine)  Squamous cell carcinoma of right lung [facility])   Staging form: Lung, AJCC 7th Edition     Clinical stage from 07/17/2014: Stage IV (T4, N0, M1a) - Signed by Leia Alf, MD on 09/29/2014    Oncology History   # April 2016- SQUAMOUS CELL of LUNG ca [RUL] STAGE III; [EBUS-Right hilum];  (? Stage IV Left lung nodule- not Bx)   # FEB 2017-STAGE IV [contralateral lung nodules] PET Recurrence/Progression [high risk for [facility] 2017- START Tecentriq q 3W x3; April 2017 CT- LUL Stable/slight increase in size [~71mm]; new lingula ~60mm; improved RUL radiation changes; Improved basilar nodules ? Atypical or infectious etilogy; JUne 12th CT-STABLE LUL  # July 28th CT- Progression in lung [bil hand /knee swelling? Autoimmune AEs to Tecentriq]; AUG 10th [place]-Taxol  # Schizophrenia/ group home resident; Difficulty swallowing s/p EGD [facility] spring 2018]  # MOLECULAR TESTING- insuff sample; plan liquid biopsy     Malignant neoplasm of hilus of right lung (HCC)     INTERVAL HISTORY: Poor historian given his schizophrenia. Accompanied by caregiver.  61 year old male patient with above history ofMetastatic squamous cell lung cancer is here for follow-up/ currently on Tecentriq- Noted to have progression of disease on the CT scan in 11/12/2016- is here to start chemotherapy with carboplatin and Taxol.  Patient admits to slight improvement of his bilateral hand; knee swelling. His appetite is improved- is on prednisone 20 mg a day.  No diarrhea. No shortness of breath or cough. No hemoptysis. No skin rash. No headaches or vision changes.    REVIEW OF SYSTEMS:  Difficult to assess given his poor history giving skills.  PAST MEDICAL HISTORY :   Past Medical History:  Diagnosis Date  . Chronic fatigue   . Colon polyps   . Hyperlipemia   . Hypertension   . Hypothyroidism   . Schizophrenia ([place])   . 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 Wears dentures    full upper and lower    PAST SURGICAL HISTORY :   Past Surgical History:  Procedure Laterality Date  . COLONOSCOPY     history of colon polyps  . COLONOSCOPY WITH PROPOFOL N/A 11/24/2016   Procedure: COLONOSCOPY WITH PROPOFOL;  Surgeon: Jonathon Bellows, MD;  Location: [facility] ENDOSCOPY;  Service: Endoscopy;  Laterality: N/A;  . ENDOBRONCHIAL ULTRASOUND  07/30/2014   right hilum FNA/EBUS  . ESOPHAGOGASTRODUODENOSCOPY (EGD) WITH PROPOFOL N/A 08/15/2016   Procedure: ESOPHAGOGASTRODUODENOSCOPY (EGD) WITH PROPOFOL;  Surgeon: [name], MD;  Location: [facility];  Service: Endoscopy;  Laterality: N/A;  . PERIPHERAL VASCULAR CATHETERIZATION N/A 08/21/2014   Procedure: Glori Luis Cath Insertion;  Surgeon: Algernon Huxley, MD;  Location: [facility];  Service: Cardiovascular;  Laterality: N/A;    FAMILY HISTORY :  No family history on file.  SOCIAL HISTORY:   Social History  Substance Use Topics  . Smoking status: Former Smoker    Packs/day: 0.25    Years: 42.00    Types: Cigarettes    Quit date: 2015  . Smokeless tobacco: Never Used  . Alcohol use No    ALLERGIES:  has No Known Allergies.  MEDICATIONS:  Current Outpatient Prescriptions  Medication Sig Dispense Refill  . atorvastatin (LIPITOR) 10 MG tablet Take 10  mg by mouth every morning.     . calcium carbonate (TUMS - DOSED IN MG ELEMENTAL CALCIUM) 500 MG chewable tablet Chew 1 tablet by mouth 2 (two) times daily.    . ferrous sulfate 325 (65 FE) MG tablet Take 325 mg by mouth daily with breakfast.    . levothyroxine (SYNTHROID, LEVOTHROID) 88 MCG tablet Take 88 mcg by mouth every morning.     Marland Kitchen lisinopril (PRINIVIL,ZESTRIL) 10 MG tablet Take 10 mg by mouth daily.    Marland Kitchen OLANZapine (ZYPREXA) 20 MG  tablet Take 20 mg by mouth at bedtime.     . ondansetron (ZOFRAN) 8 MG tablet Take 1 tablet (8 mg total) by mouth every 8 (eight) hours as needed for nausea or vomiting (start 3 days; after chemo). 40 tablet 1  . pantoprazole (PROTONIX) 40 MG tablet Take 1 tablet (40 mg total) by mouth daily. 30 tablet 11  . predniSONE (DELTASONE) 20 MG tablet 2 pills a day with food x 14 days; and then once a day with food. 21 tablet 0  . prochlorperazine (COMPAZINE) 10 MG tablet TAKE 1 TABLET BY MOUTH EVERY 6 HOURS AS NEEDED FOR NAUSEA & VOMITING 30 tablet 0  . propranolol (INDERAL) 10 MG tablet Take 10 mg by mouth 3 (three) times daily.    . sucralfate (CARAFATE) 1 g tablet Take 1 g by mouth 4 (four) times daily -  with meals and at bedtime.     No current facility-administered medications for this visit.     PHYSICAL EXAMINATION: ECOG PERFORMANCE STATUS: 0 - Asymptomatic  BP 138/83 (BP Location: Left Arm, Patient Position: Sitting)   Pulse 90   Temp 97.6 F (36.4 C) (Tympanic)   Resp 16   Wt 199 lb 3.2 oz (90.4 kg)   BMI 27.02 kg/m   Filed Weights   11/25/16 0935  Weight: 199 lb 3.2 oz (90.4 kg)    GENERAL: Well-nourished well-developed; Alert, no distress and comfortable.  Accompanied by caregiver. EYES: no pallor or icterus OROPHARYNX: no thrush or ulceration;Dentures NECK: supple, no masses felt LYMPH:  no palpable lymphadenopathy in the cervical, axillary or inguinal regions LUNGS: clear to auscultation and  No wheeze or crackles HEART/CVS: regular rate & rhythm and no murmurs; No lower extremity edema ABDOMEN:abdomen soft, non-tender and normal bowel sounds Musculoskeletal:no cyanosis of digits and no clubbing; Bilateral hand swelling noted/improved. PSYCH: alert & oriented x 3 with fluent speech NEURO: no focal motor/sensory deficits SKIN:  no rashes or significant lesions     LABORATORY DATA:  I have reviewed the data as listed    Component Value Date/Time   NA 139  11/16/2016 0804   NA 135 07/28/2014 1121   K 4.2 11/16/2016 0804   K 4.3 07/28/2014 1121   CL 104 11/16/2016 0804   CL 101 07/28/2014 1121   CO2 28 11/16/2016 0804   CO2 25 07/28/2014 1121   GLUCOSE 92 11/16/2016 0804   GLUCOSE 135 (H) 07/28/2014 1121   BUN 10 11/16/2016 0804   BUN 19 07/28/2014 1121   CREATININE 0.80 11/16/2016 0804   CREATININE 1.36 (H) 07/28/2014 1121   CALCIUM 9.0 11/16/2016 0804   CALCIUM 9.1 07/28/2014 1121   PROT 6.3 (L) 11/16/2016 0804   PROT 7.6 07/23/2014 1042   ALBUMIN 3.1 (L) 11/16/2016 0804   ALBUMIN 4.2 07/23/2014 1042   AST 17 11/16/2016 0804   AST 24 07/23/2014 1042   ALT 18 11/16/2016 0804   ALT 24 07/23/2014 1042  ALKPHOS 84 11/16/2016 0804   ALKPHOS 108 07/23/2014 1042   BILITOT 0.4 11/16/2016 0804   BILITOT 0.5 07/23/2014 1042   GFRNONAA >60 11/16/2016 0804   GFRNONAA 57 (L) 07/28/2014 1121   GFRAA >60 11/16/2016 0804   GFRAA >60 07/28/2014 1121    No results found for: SPEP, UPEP  Lab Results  Component Value Date   WBC 8.6 11/16/2016   NEUTROABS 7.0 (H) 11/16/2016   HGB 9.5 (L) 11/16/2016   HCT 28.2 (L) 11/16/2016   MCV 81.6 11/16/2016   PLT 380 11/16/2016      Chemistry      Component Value Date/Time   NA 139 11/16/2016 0804   NA 135 07/28/2014 1121   K 4.2 11/16/2016 0804   K 4.3 07/28/2014 1121   CL 104 11/16/2016 0804   CL 101 07/28/2014 1121   CO2 28 11/16/2016 0804   CO2 25 07/28/2014 1121   BUN 10 11/16/2016 0804   BUN 19 07/28/2014 1121   CREATININE 0.80 11/16/2016 0804   CREATININE 1.36 (H) 07/28/2014 1121      Component Value Date/Time   CALCIUM 9.0 11/16/2016 0804   CALCIUM 9.1 07/28/2014 1121   ALKPHOS 84 11/16/2016 0804   ALKPHOS 108 07/23/2014 1042   AST 17 11/16/2016 0804   AST 24 07/23/2014 1042   ALT 18 11/16/2016 0804   ALT 24 07/23/2014 1042   BILITOT 0.4 11/16/2016 0804   BILITOT 0.5 07/23/2014 1042     IIMPRESSION: 1. Today's study is highly concerning for recurrent disease  with enlarging mass-like areas in the right upper lobe, and an enlarging nodule with developing satellite nodule in the left upper lobe near the apex, as detailed above. There is also a mildly enlarged right hilar lymph node, and a new small right-sided pleural effusion. 2. Persistent circumferential thickening of the distal third of the esophagus. This may simply reflect esophagitis either from reflux or radiation therapy, however, if there is any clinical concern for Barrett's metaplasia or esophageal neoplasia, further evaluation with endoscopy could be considered. 3. Aortic atherosclerosis, in addition to three-vessel coronary artery disease. Please note that although the presence of coronary artery calcium documents the presence of coronary artery disease, the severity of this disease and any potential stenosis cannot be assessed on this non-gated CT examination. Assessment for potential risk factor modification, dietary therapy or pharmacologic therapy may be warranted, if clinically indicated. 4. Diffuse bronchial wall thickening with moderate to severe centrilobular and paraseptal emphysema; imaging findings suggestive of underlying COPD. 5. Additional incidental findings, as above.  Aortic Atherosclerosis (ICD10-I70.0) and Emphysema (ICD10-J43.9).   Electronically Signed   By: Vinnie Langton M.D.   On: 11/14/2016 09:23  RADIOGRAPHIC STUDIES: I have personally reviewed the radiological images as listed and agreed with the findings in the report. No results found.   ASSESSMENT & PLAN:  Malignant neoplasm of hilus of right lung ([place]) # SQUAMOUS Cell LUNG CA- STAGE IV- [ tumor nodule in the contralateral lung/ recurrence]; Currently on tecentriq.  CT July 28th -Progression on Right UL/hilar mass; and also LUL nodules.   # Proceed with carbo Taxol chemotherapy- every 3 weeks. I again reviewed with the patient/caregiver-potential side effects including but not limited  to-increasing fatigue, nausea vomiting, diarrhea, hair loss, sores in the mouth, increase risk of infection and also neuropathy. Discussed that would recommend  anywhere between 4-6 cycles of chemotherapy.   # Foundation one testing-not enough tissue; would recommend liquid biopsy at next visit.  Growth  factor-Neulasta/On pro would be given as prophylaxis for chemotherapy-induced neutropenia to prevent febrile neutropenias. Discussed potential side effect- myalgias/arthralgias- recommend Claritin for 4 days.   # labs in 10 days; follow up with me in 3 weeks/chemo- on pro/MD.    Orders Placed This Encounter  Procedures  . CBC with Differential    Standing Status:   Future    Standing Expiration Date:   11/25/2017  . Basic metabolic panel    Standing Status:   Future    Standing Expiration Date:   11/25/2017   All questions were answered. The patient knows to call the clinic with any problems, questions or concerns.      [name], MD 11/27/2016 6:56 PM       [name], MD 11/27/2016 6:56 PM

## 2016-11-25 NOTE — Progress Notes (Signed)
Labs from 11-16-16 reviewed. Per MD, Dr. Rogue Bussing, order: no new lab work needed today, proceed with treatment as scheduled.

## 2016-11-25 NOTE — Assessment & Plan Note (Addendum)
#   SQUAMOUS Cell LUNG CA- STAGE IV- [ tumor nodule in the contralateral lung/ recurrence]; Currently on tecentriq.  CT July 28th -Progression on Right UL/hilar mass; and also LUL nodules.   # Proceed with carbo Taxol chemotherapy- every 3 weeks. I again reviewed with the patient/caregiver-potential side effects including but not limited to-increasing fatigue, nausea vomiting, diarrhea, hair loss, sores in the mouth, increase risk of infection and also neuropathy. Discussed that would recommend  anywhere between 4-6 cycles of chemotherapy.   # Foundation one testing-not enough tissue; would recommend liquid biopsy at next visit.  Growth factor-Neulasta/On pro would be given as prophylaxis for chemotherapy-induced neutropenia to prevent febrile neutropenias. Discussed potential side effect- myalgias/arthralgias- recommend Claritin for 4 days.   # labs in 10 days; follow up with me in 3 weeks/chemo- on pro/MD.

## 2016-11-28 ENCOUNTER — Other Ambulatory Visit: Payer: Self-pay | Admitting: Internal Medicine

## 2016-12-05 ENCOUNTER — Inpatient Hospital Stay: Payer: Medicare Other

## 2016-12-05 DIAGNOSIS — Z5111 Encounter for antineoplastic chemotherapy: Secondary | ICD-10-CM | POA: Diagnosis not present

## 2016-12-05 DIAGNOSIS — C3401 Malignant neoplasm of right main bronchus: Secondary | ICD-10-CM

## 2016-12-05 LAB — CBC WITH DIFFERENTIAL/PLATELET
Basophils Absolute: 0 10*3/uL (ref 0–0.1)
Basophils Relative: 0 %
EOS ABS: 0 10*3/uL (ref 0–0.7)
Eosinophils Relative: 0 %
HEMATOCRIT: 27.2 % — AB (ref 40.0–52.0)
Hemoglobin: 8.9 g/dL — ABNORMAL LOW (ref 13.0–18.0)
LYMPHS ABS: 0.6 10*3/uL — AB (ref 1.0–3.6)
LYMPHS PCT: 25 %
MCH: 26.8 pg (ref 26.0–34.0)
MCHC: 32.9 g/dL (ref 32.0–36.0)
MCV: 81.4 fL (ref 80.0–100.0)
MONOS PCT: 15 %
Monocytes Absolute: 0.4 10*3/uL (ref 0.2–1.0)
Neutro Abs: 1.4 10*3/uL (ref 1.4–6.5)
Neutrophils Relative %: 60 %
Platelets: 167 10*3/uL (ref 150–440)
RBC: 3.34 MIL/uL — AB (ref 4.40–5.90)
RDW: 15.3 % — ABNORMAL HIGH (ref 11.5–14.5)
WBC: 2.4 10*3/uL — ABNORMAL LOW (ref 3.8–10.6)

## 2016-12-05 LAB — COMPREHENSIVE METABOLIC PANEL
ALK PHOS: 86 U/L (ref 38–126)
ALT: 17 U/L (ref 17–63)
ANION GAP: 8 (ref 5–15)
AST: 20 U/L (ref 15–41)
Albumin: 3.6 g/dL (ref 3.5–5.0)
BILIRUBIN TOTAL: 0.7 mg/dL (ref 0.3–1.2)
BUN: 14 mg/dL (ref 6–20)
CALCIUM: 9.5 mg/dL (ref 8.9–10.3)
CO2: 28 mmol/L (ref 22–32)
CREATININE: 0.99 mg/dL (ref 0.61–1.24)
Chloride: 101 mmol/L (ref 101–111)
Glucose, Bld: 105 mg/dL — ABNORMAL HIGH (ref 65–99)
Potassium: 4.5 mmol/L (ref 3.5–5.1)
SODIUM: 137 mmol/L (ref 135–145)
TOTAL PROTEIN: 7.1 g/dL (ref 6.5–8.1)

## 2016-12-16 ENCOUNTER — Inpatient Hospital Stay: Payer: Medicare Other

## 2016-12-16 ENCOUNTER — Other Ambulatory Visit: Payer: Self-pay

## 2016-12-16 ENCOUNTER — Emergency Department: Payer: Medicare Other

## 2016-12-16 ENCOUNTER — Inpatient Hospital Stay
Admission: EM | Admit: 2016-12-16 | Discharge: 2017-01-16 | DRG: 915 | Disposition: E | Payer: Medicare Other | Attending: Internal Medicine | Admitting: Internal Medicine

## 2016-12-16 ENCOUNTER — Telehealth: Payer: Self-pay | Admitting: Internal Medicine

## 2016-12-16 ENCOUNTER — Inpatient Hospital Stay (HOSPITAL_BASED_OUTPATIENT_CLINIC_OR_DEPARTMENT_OTHER): Payer: Medicare Other | Admitting: Internal Medicine

## 2016-12-16 VITALS — BP 147/87 | HR 78 | Temp 98.2°F | Resp 16 | Wt 200.2 lb

## 2016-12-16 DIAGNOSIS — E039 Hypothyroidism, unspecified: Secondary | ICD-10-CM

## 2016-12-16 DIAGNOSIS — I469 Cardiac arrest, cause unspecified: Secondary | ICD-10-CM | POA: Diagnosis present

## 2016-12-16 DIAGNOSIS — F209 Schizophrenia, unspecified: Secondary | ICD-10-CM

## 2016-12-16 DIAGNOSIS — Z79899 Other long term (current) drug therapy: Secondary | ICD-10-CM

## 2016-12-16 DIAGNOSIS — J9602 Acute respiratory failure with hypercapnia: Secondary | ICD-10-CM | POA: Diagnosis present

## 2016-12-16 DIAGNOSIS — J9600 Acute respiratory failure, unspecified whether with hypoxia or hypercapnia: Secondary | ICD-10-CM

## 2016-12-16 DIAGNOSIS — I468 Cardiac arrest due to other underlying condition: Secondary | ICD-10-CM | POA: Diagnosis present

## 2016-12-16 DIAGNOSIS — I481 Persistent atrial fibrillation: Secondary | ICD-10-CM | POA: Diagnosis present

## 2016-12-16 DIAGNOSIS — R5382 Chronic fatigue, unspecified: Secondary | ICD-10-CM | POA: Diagnosis not present

## 2016-12-16 DIAGNOSIS — J439 Emphysema, unspecified: Secondary | ICD-10-CM | POA: Diagnosis not present

## 2016-12-16 DIAGNOSIS — E872 Acidosis: Secondary | ICD-10-CM | POA: Diagnosis not present

## 2016-12-16 DIAGNOSIS — I471 Supraventricular tachycardia: Secondary | ICD-10-CM | POA: Diagnosis present

## 2016-12-16 DIAGNOSIS — I10 Essential (primary) hypertension: Secondary | ICD-10-CM

## 2016-12-16 DIAGNOSIS — Z87891 Personal history of nicotine dependence: Secondary | ICD-10-CM

## 2016-12-16 DIAGNOSIS — I700 Atherosclerosis of aorta: Secondary | ICD-10-CM | POA: Diagnosis not present

## 2016-12-16 DIAGNOSIS — E785 Hyperlipidemia, unspecified: Secondary | ICD-10-CM

## 2016-12-16 DIAGNOSIS — Z9911 Dependence on respirator [ventilator] status: Secondary | ICD-10-CM | POA: Diagnosis not present

## 2016-12-16 DIAGNOSIS — R911 Solitary pulmonary nodule: Secondary | ICD-10-CM | POA: Diagnosis not present

## 2016-12-16 DIAGNOSIS — I499 Cardiac arrhythmia, unspecified: Secondary | ICD-10-CM

## 2016-12-16 DIAGNOSIS — R4182 Altered mental status, unspecified: Secondary | ICD-10-CM | POA: Diagnosis not present

## 2016-12-16 DIAGNOSIS — R1310 Dysphagia, unspecified: Secondary | ICD-10-CM | POA: Diagnosis not present

## 2016-12-16 DIAGNOSIS — T451X5A Adverse effect of antineoplastic and immunosuppressive drugs, initial encounter: Secondary | ICD-10-CM | POA: Diagnosis present

## 2016-12-16 DIAGNOSIS — I4891 Unspecified atrial fibrillation: Secondary | ICD-10-CM | POA: Diagnosis not present

## 2016-12-16 DIAGNOSIS — R600 Localized edema: Secondary | ICD-10-CM

## 2016-12-16 DIAGNOSIS — Y92531 Health care provider office as the place of occurrence of the external cause: Secondary | ICD-10-CM

## 2016-12-16 DIAGNOSIS — Z86010 Personal history of colonic polyps: Secondary | ICD-10-CM | POA: Diagnosis not present

## 2016-12-16 DIAGNOSIS — T886XXA Anaphylactic reaction due to adverse effect of correct drug or medicament properly administered, initial encounter: Secondary | ICD-10-CM | POA: Diagnosis present

## 2016-12-16 DIAGNOSIS — I2510 Atherosclerotic heart disease of native coronary artery without angina pectoris: Secondary | ICD-10-CM

## 2016-12-16 DIAGNOSIS — I472 Ventricular tachycardia: Secondary | ICD-10-CM | POA: Diagnosis not present

## 2016-12-16 DIAGNOSIS — I4892 Unspecified atrial flutter: Secondary | ICD-10-CM | POA: Diagnosis present

## 2016-12-16 DIAGNOSIS — C3401 Malignant neoplasm of right main bronchus: Secondary | ICD-10-CM

## 2016-12-16 DIAGNOSIS — Z66 Do not resuscitate: Secondary | ICD-10-CM | POA: Diagnosis not present

## 2016-12-16 DIAGNOSIS — J449 Chronic obstructive pulmonary disease, unspecified: Secondary | ICD-10-CM | POA: Diagnosis present

## 2016-12-16 DIAGNOSIS — M7989 Other specified soft tissue disorders: Secondary | ICD-10-CM | POA: Diagnosis not present

## 2016-12-16 DIAGNOSIS — D649 Anemia, unspecified: Secondary | ICD-10-CM

## 2016-12-16 DIAGNOSIS — E876 Hypokalemia: Secondary | ICD-10-CM | POA: Diagnosis present

## 2016-12-16 DIAGNOSIS — R4020 Unspecified coma: Secondary | ICD-10-CM | POA: Diagnosis present

## 2016-12-16 DIAGNOSIS — I483 Typical atrial flutter: Secondary | ICD-10-CM | POA: Diagnosis not present

## 2016-12-16 DIAGNOSIS — Z515 Encounter for palliative care: Secondary | ICD-10-CM | POA: Diagnosis not present

## 2016-12-16 DIAGNOSIS — I4819 Other persistent atrial fibrillation: Secondary | ICD-10-CM

## 2016-12-16 DIAGNOSIS — D630 Anemia in neoplastic disease: Secondary | ICD-10-CM | POA: Diagnosis present

## 2016-12-16 DIAGNOSIS — Y848 Other medical procedures as the cause of abnormal reaction of the patient, or of later complication, without mention of misadventure at the time of the procedure: Secondary | ICD-10-CM | POA: Diagnosis present

## 2016-12-16 DIAGNOSIS — C3490 Malignant neoplasm of unspecified part of unspecified bronchus or lung: Secondary | ICD-10-CM

## 2016-12-16 DIAGNOSIS — Z9221 Personal history of antineoplastic chemotherapy: Secondary | ICD-10-CM | POA: Diagnosis not present

## 2016-12-16 DIAGNOSIS — T782XXA Anaphylactic shock, unspecified, initial encounter: Secondary | ICD-10-CM | POA: Diagnosis not present

## 2016-12-16 DIAGNOSIS — J9601 Acute respiratory failure with hypoxia: Secondary | ICD-10-CM

## 2016-12-16 DIAGNOSIS — I480 Paroxysmal atrial fibrillation: Secondary | ICD-10-CM | POA: Diagnosis not present

## 2016-12-16 DIAGNOSIS — C3402 Malignant neoplasm of left main bronchus: Secondary | ICD-10-CM | POA: Diagnosis not present

## 2016-12-16 DIAGNOSIS — Z7952 Long term (current) use of systemic steroids: Secondary | ICD-10-CM

## 2016-12-16 LAB — CBC
HEMATOCRIT: 27.2 % — AB (ref 40.0–52.0)
HEMATOCRIT: 26.7 % — AB (ref 40.0–52.0)
HEMOGLOBIN: 8.5 g/dL — AB (ref 13.0–18.0)
Hemoglobin: 8 g/dL — ABNORMAL LOW (ref 13.0–18.0)
MCH: 27.6 pg (ref 26.0–34.0)
MCH: 26.8 pg (ref 26.0–34.0)
MCHC: 29.5 g/dL — AB (ref 32.0–36.0)
MCHC: 31.8 g/dL — AB (ref 32.0–36.0)
MCV: 93.5 fL (ref 80.0–100.0)
MCV: 84.3 fL (ref 80.0–100.0)
PLATELETS: 491 10*3/uL — AB (ref 150–440)
Platelets: 500 10*3/uL — ABNORMAL HIGH (ref 150–440)
RBC: 2.91 MIL/uL — ABNORMAL LOW (ref 4.40–5.90)
RBC: 3.16 MIL/uL — ABNORMAL LOW (ref 4.40–5.90)
RDW: 17.3 % — AB (ref 11.5–14.5)
RDW: 16.8 % — AB (ref 11.5–14.5)
WBC: 4.8 10*3/uL (ref 3.8–10.6)
WBC: 12.3 10*3/uL — ABNORMAL HIGH (ref 3.8–10.6)

## 2016-12-16 LAB — COMPREHENSIVE METABOLIC PANEL
ALT: 16 U/L — AB (ref 17–63)
ALT: 23 U/L (ref 17–63)
ANION GAP: 6 (ref 5–15)
AST: 33 U/L (ref 15–41)
AST: 65 U/L — ABNORMAL HIGH (ref 15–41)
Albumin: 3.2 g/dL — ABNORMAL LOW (ref 3.5–5.0)
Albumin: 2.6 g/dL — ABNORMAL LOW (ref 3.5–5.0)
Alkaline Phosphatase: 84 U/L (ref 38–126)
Alkaline Phosphatase: 93 U/L (ref 38–126)
Anion gap: 16 — ABNORMAL HIGH (ref 5–15)
BUN: 10 mg/dL (ref 6–20)
BUN: 9 mg/dL (ref 6–20)
CALCIUM: 8.9 mg/dL (ref 8.9–10.3)
CHLORIDE: 104 mmol/L (ref 101–111)
CO2: 30 mmol/L (ref 22–32)
CO2: 21 mmol/L — ABNORMAL LOW (ref 22–32)
CREATININE: 0.96 mg/dL (ref 0.61–1.24)
Calcium: 8.4 mg/dL — ABNORMAL LOW (ref 8.9–10.3)
Chloride: 108 mmol/L (ref 101–111)
Creatinine, Ser: 1.12 mg/dL (ref 0.61–1.24)
GFR calc Af Amer: >60 mL/min (ref >60)
GFR calc non Af Amer: >60 mL/min (ref >60)
GFR calc non Af Amer: >60 mL/min (ref >60)
Glucose, Bld: 91 mg/dL (ref 65–99)
Glucose, Bld: 393 mg/dL — ABNORMAL HIGH (ref 65–99)
Potassium: 4.1 mmol/L (ref 3.5–5.1)
Potassium: 3.7 mmol/L (ref 3.5–5.1)
SODIUM: 140 mmol/L (ref 135–145)
Sodium: 145 mmol/L (ref 135–145)
Total Bilirubin: 0.3 mg/dL (ref 0.3–1.2)
Total Bilirubin: 0.4 mg/dL (ref 0.3–1.2)
Total Protein: 6.9 g/dL (ref 6.5–8.1)
Total Protein: 6 g/dL — ABNORMAL LOW (ref 6.5–8.1)

## 2016-12-16 LAB — BASIC METABOLIC PANEL
Anion gap: 10 (ref 5–15)
BUN: 15 mg/dL (ref 6–20)
CALCIUM: 7.7 mg/dL — AB (ref 8.9–10.3)
CO2: 28 mmol/L (ref 22–32)
CREATININE: 1.13 mg/dL (ref 0.61–1.24)
Chloride: 106 mmol/L (ref 101–111)
GFR calc non Af Amer: >60 mL/min (ref >60)
Glucose, Bld: 226 mg/dL — ABNORMAL HIGH (ref 65–99)
Potassium: 4.3 mmol/L (ref 3.5–5.1)
SODIUM: 144 mmol/L (ref 135–145)

## 2016-12-16 LAB — URINALYSIS, COMPLETE (UACMP) WITH MICROSCOPIC
BILIRUBIN URINE: NEGATIVE
Bacteria, UA: NONE SEEN
Glucose, UA: 150 mg/dL — AB
KETONES UR: NEGATIVE mg/dL
LEUKOCYTES UA: NEGATIVE
Nitrite: NEGATIVE
PH: 6 (ref 5.0–8.0)
PROTEIN: 30 mg/dL — AB
Specific Gravity, Urine: 1.006 (ref 1.005–1.030)
Squamous Epithelial / LPF: NONE SEEN

## 2016-12-16 LAB — LACTIC ACID, PLASMA
Lactic Acid, Venous: 12.5 mmol/L (ref 0.5–1.9)
Lactic Acid, Venous: 3.7 mmol/L (ref 0.5–1.9)

## 2016-12-16 LAB — CBC WITH DIFFERENTIAL/PLATELET
Basophils Absolute: 0 10*3/uL (ref 0–0.1)
Basophils Relative: 0 %
EOS ABS: 0 10*3/uL (ref 0–0.7)
EOS PCT: 0 %
HCT: 24.5 % — ABNORMAL LOW (ref 40.0–52.0)
Hemoglobin: 8.2 g/dL — ABNORMAL LOW (ref 13.0–18.0)
LYMPHS ABS: 0.8 10*3/uL — AB (ref 1.0–3.6)
LYMPHS PCT: 16 %
MCH: 27.7 pg (ref 26.0–34.0)
MCHC: 33.5 g/dL (ref 32.0–36.0)
MCV: 82.8 fL (ref 80.0–100.0)
MONO ABS: 0.7 10*3/uL (ref 0.2–1.0)
MONOS PCT: 15 %
Neutro Abs: 3.4 10*3/uL (ref 1.4–6.5)
Neutrophils Relative %: 69 %
PLATELETS: 472 10*3/uL — AB (ref 150–440)
RBC: 2.96 MIL/uL — AB (ref 4.40–5.90)
RDW: 16.7 % — AB (ref 11.5–14.5)
WBC: 5 10*3/uL (ref 3.8–10.6)

## 2016-12-16 LAB — PROTIME-INR
INR: 1.27
PROTHROMBIN TIME: 15.8 s — AB (ref 11.4–15.2)

## 2016-12-16 LAB — BLOOD GAS, ARTERIAL
ACID-BASE EXCESS: 0.7 mmol/L (ref 0.0–2.0)
Acid-base deficit: 14.7 mmol/L — ABNORMAL HIGH (ref 0.0–2.0)
BICARBONATE: 16.9 mmol/L — AB (ref 20.0–28.0)
BICARBONATE: 28.2 mmol/L — AB (ref 20.0–28.0)
FIO2: 100
FIO2: 0.4
LHR: 20 {breaths}/min
MECHVT: 500 mL
O2 SAT: 84.5 %
O2 Saturation: 99.9 %
PATIENT TEMPERATURE: 37
PCO2 ART: 60 mmHg — AB (ref 32.0–48.0)
PEEP: 5 cmH2O
PEEP: 10 cmH2O
PH ART: 7.28 — AB (ref 7.350–7.450)
PO2 ART: 358 mmHg — AB (ref 83.0–108.0)
Patient temperature: 37
RATE: 20 resp/min
VT: 500 mL
pCO2 arterial: 77 mmHg (ref 32.0–48.0)
pH, Arterial: 6.95 — CL (ref 7.350–7.450)
pO2, Arterial: 56 mmHg — ABNORMAL LOW (ref 83.0–108.0)

## 2016-12-16 LAB — APTT: aPTT: 48 seconds — ABNORMAL HIGH (ref 24–36)

## 2016-12-16 LAB — TROPONIN I
TROPONIN I: 0.05 ng/mL — AB (ref <0.03)
Troponin I: <0.03 ng/mL (ref <0.03)

## 2016-12-16 LAB — PHOSPHORUS: PHOSPHORUS: 3.8 mg/dL (ref 2.5–4.6)

## 2016-12-16 LAB — MAGNESIUM
Magnesium: 2.3 mg/dL (ref 1.7–2.4)
Magnesium: 1.8 mg/dL (ref 1.7–2.4)

## 2016-12-16 LAB — MRSA PCR SCREENING: MRSA BY PCR: NEGATIVE

## 2016-12-16 LAB — BRAIN NATRIURETIC PEPTIDE: B Natriuretic Peptide: 337 pg/mL — ABNORMAL HIGH (ref 0.0–100.0)

## 2016-12-16 LAB — GLUCOSE, CAPILLARY: GLUCOSE-CAPILLARY: 244 mg/dL — AB (ref 65–99)

## 2016-12-16 MED ORDER — PREDNISONE 20 MG PO TABS: ORAL_TABLET | ORAL | 0 refills | Status: AC

## 2016-12-16 MED ORDER — SENNOSIDES 8.8 MG/5ML PO SYRP
5.0000 mL | ORAL_SOLUTION | Freq: Two times a day (BID) | ORAL | Status: DC | PRN
  Filled 2016-12-16: qty 5

## 2016-12-16 MED ORDER — ONDANSETRON HCL 4 MG PO TABS: 4.0000 mg | ORAL_TABLET | Freq: Four times a day (QID) | ORAL | Status: DC | PRN

## 2016-12-16 MED ORDER — VANCOMYCIN HCL IN DEXTROSE 1-5 GM/200ML-% IV SOLN: 1000.0000 mg | Freq: Once | INTRAVENOUS | Status: DC

## 2016-12-16 MED ORDER — PEGFILGRASTIM 6 MG/0.6ML ~~LOC~~ PSKT: 6.0000 mg | PREFILLED_SYRINGE | Freq: Once | SUBCUTANEOUS | Status: DC

## 2016-12-16 MED ORDER — PALONOSETRON HCL INJECTION 0.25 MG/5ML
0.2500 mg | Freq: Once | INTRAVENOUS | Status: AC
  Administered 2016-12-16: 0.25 mg via INTRAVENOUS
  Filled 2016-12-16: qty 5

## 2016-12-16 MED ORDER — VANCOMYCIN HCL 10 G IV SOLR
1250.0000 mg | Freq: Two times a day (BID) | INTRAVENOUS | Status: DC
  Administered 2016-12-16 – 2016-12-18 (×4): 1250 mg via INTRAVENOUS
  Filled 2016-12-16 (×5): qty 1250

## 2016-12-16 MED ORDER — SODIUM CHLORIDE 0.9 % IV SOLN
640.0000 mg | Freq: Once | INTRAVENOUS | Status: AC
  Administered 2016-12-16: 640 mg via INTRAVENOUS
  Filled 2016-12-16: qty 64

## 2016-12-16 MED ORDER — SODIUM CHLORIDE 0.9 % IV SOLN
Freq: Once | INTRAVENOUS | Status: AC
  Administered 2016-12-16: 10:00:00 via INTRAVENOUS
  Filled 2016-12-16: qty 1000

## 2016-12-16 MED ORDER — IPRATROPIUM-ALBUTEROL 0.5-2.5 (3) MG/3ML IN SOLN
3.0000 mL | Freq: Four times a day (QID) | RESPIRATORY_TRACT | Status: DC
  Administered 2016-12-16 – 2016-12-17 (×5): 3 mL via RESPIRATORY_TRACT
  Filled 2016-12-16 (×5): qty 3

## 2016-12-16 MED ORDER — DEXTROSE 50 % IV SOLN
INTRAVENOUS | Status: DC | PRN
  Administered 2016-12-16: 1 via INTRAVENOUS

## 2016-12-16 MED ORDER — FENTANYL 2500MCG IN NS 250ML (10MCG/ML) PREMIX INFUSION
25.0000 ug/h | INTRAVENOUS | Status: DC
  Administered 2016-12-16: 30 ug/h via INTRAVENOUS
  Administered 2016-12-17: 125 ug/h via INTRAVENOUS
  Filled 2016-12-16: qty 250

## 2016-12-16 MED ORDER — MIDAZOLAM HCL 2 MG/2ML IJ SOLN
2.0000 mg | INTRAMUSCULAR | Status: DC | PRN
  Filled 2016-12-16 (×3): qty 2

## 2016-12-16 MED ORDER — PIPERACILLIN-TAZOBACTAM 3.375 G IVPB
3.3750 g | Freq: Three times a day (TID) | INTRAVENOUS | Status: DC
  Administered 2016-12-16 – 2016-12-18 (×5): 3.375 g via INTRAVENOUS
  Filled 2016-12-16 (×5): qty 50

## 2016-12-16 MED ORDER — FENTANYL BOLUS VIA INFUSION
50.0000 ug | INTRAVENOUS | Status: DC | PRN
  Administered 2016-12-18: 100 ug via INTRAVENOUS
  Filled 2016-12-16: qty 100

## 2016-12-16 MED ORDER — DIPHENHYDRAMINE HCL 50 MG/ML IJ SOLN
50.0000 mg | Freq: Once | INTRAMUSCULAR | Status: AC
  Administered 2016-12-16: 50 mg via INTRAVENOUS
  Filled 2016-12-16: qty 1

## 2016-12-16 MED ORDER — DOPAMINE-DEXTROSE 3.2-5 MG/ML-% IV SOLN
0.0000 ug/kg/min | Freq: Once | INTRAVENOUS | Status: AC
  Administered 2016-12-16: 10 ug/kg/min via INTRAVENOUS

## 2016-12-16 MED ORDER — BISACODYL 10 MG RE SUPP: 10.0000 mg | Freq: Every day | RECTAL | Status: DC | PRN

## 2016-12-16 MED ORDER — STERILE WATER FOR INJECTION IV SOLN
INTRAVENOUS | Status: DC
  Administered 2016-12-16: 18:00:00 via INTRAVENOUS
  Filled 2016-12-16 (×4): qty 850

## 2016-12-16 MED ORDER — SODIUM CHLORIDE 0.9 % IV BOLUS (SEPSIS)
1000.0000 mL | Freq: Once | INTRAVENOUS | Status: AC
  Administered 2016-12-16: 1000 mL via INTRAVENOUS

## 2016-12-16 MED ORDER — ACETAMINOPHEN 325 MG PO TABS
650.0000 mg | ORAL_TABLET | Freq: Four times a day (QID) | ORAL | Status: DC | PRN
Start: 2016-12-16 — End: 2016-12-18
  Administered 2016-12-17: 650 mg via ORAL
  Filled 2016-12-16: qty 2

## 2016-12-16 MED ORDER — EPINEPHRINE PF 1 MG/10ML IJ SOSY
PREFILLED_SYRINGE | INTRAMUSCULAR | Status: AC | PRN
  Administered 2016-12-16 (×6): 1 mg via INTRAVENOUS

## 2016-12-16 MED ORDER — SODIUM CHLORIDE 0.9 % IV SOLN
INTRAVENOUS | Status: DC
Start: 2016-12-16 — End: 2016-12-18
  Administered 2016-12-17 – 2016-12-18 (×3): via INTRAVENOUS

## 2016-12-16 MED ORDER — FENTANYL CITRATE (PF) 100 MCG/2ML IJ SOLN
50.0000 ug | Freq: Once | INTRAMUSCULAR | Status: AC
  Administered 2016-12-16: 100 ug via INTRAVENOUS
  Filled 2016-12-16: qty 2

## 2016-12-16 MED ORDER — EPINEPHRINE PF 1 MG/10ML IJ SOSY
PREFILLED_SYRINGE | INTRAMUSCULAR | Status: DC | PRN
  Administered 2016-12-16: 1 mg via INTRAVENOUS

## 2016-12-16 MED ORDER — METHYLPREDNISOLONE SODIUM SUCC 125 MG IJ SOLR
60.0000 mg | INTRAMUSCULAR | Status: DC
  Administered 2016-12-17: 60 mg via INTRAVENOUS
  Filled 2016-12-16: qty 2

## 2016-12-16 MED ORDER — SODIUM CHLORIDE 0.9 % IV SOLN: 639.5000 mg | Freq: Once | INTRAVENOUS | Status: DC

## 2016-12-16 MED ORDER — FAMOTIDINE IN NACL 20-0.9 MG/50ML-% IV SOLN
20.0000 mg | Freq: Once | INTRAVENOUS | Status: AC
  Administered 2016-12-16: 20 mg via INTRAVENOUS
  Filled 2016-12-16: qty 50

## 2016-12-16 MED ORDER — PIPERACILLIN-TAZOBACTAM 3.375 G IVPB 30 MIN: 3.3750 g | Freq: Once | INTRAVENOUS | Status: DC

## 2016-12-16 MED ORDER — SODIUM CHLORIDE 0.9 % IV SOLN
20.0000 mg | Freq: Once | INTRAVENOUS | Status: AC
  Administered 2016-12-16: 20 mg via INTRAVENOUS
  Filled 2016-12-16: qty 2

## 2016-12-16 MED ORDER — ENOXAPARIN SODIUM 40 MG/0.4ML ~~LOC~~ SOLN
40.0000 mg | SUBCUTANEOUS | Status: DC
  Administered 2016-12-16 – 2016-12-17 (×2): 40 mg via SUBCUTANEOUS
  Filled 2016-12-16 (×2): qty 0.4

## 2016-12-16 MED ORDER — SODIUM CHLORIDE 0.9 % IV SOLN
200.0000 mg/m2 | Freq: Once | INTRAVENOUS | Status: AC
Start: 2016-12-16 — End: 2016-12-16
  Administered 2016-12-16: 426 mg via INTRAVENOUS
  Filled 2016-12-16: qty 71

## 2016-12-16 MED ORDER — BISACODYL 5 MG PO TBEC
5.0000 mg | DELAYED_RELEASE_TABLET | Freq: Every day | ORAL | Status: DC | PRN
  Filled 2016-12-16: qty 1

## 2016-12-16 MED ORDER — ONDANSETRON HCL 4 MG/2ML IJ SOLN: 4.0000 mg | Freq: Four times a day (QID) | INTRAMUSCULAR | Status: DC | PRN

## 2016-12-16 MED ORDER — MIDAZOLAM HCL 2 MG/2ML IJ SOLN
2.0000 mg | INTRAMUSCULAR | Status: DC | PRN
  Administered 2016-12-17 – 2016-12-18 (×4): 2 mg via INTRAVENOUS
  Filled 2016-12-16: qty 2

## 2016-12-16 MED ORDER — FENTANYL CITRATE (PF) 100 MCG/2ML IJ SOLN
INTRAMUSCULAR | Status: AC
  Administered 2016-12-16: 50 ug
  Filled 2016-12-16: qty 2

## 2016-12-16 MED ORDER — ATROPINE SULFATE 1 MG/ML IJ SOLN
INTRAMUSCULAR | Status: AC | PRN
  Administered 2016-12-16 (×2): 1 mg via INTRAVENOUS

## 2016-12-16 MED ORDER — MIDAZOLAM HCL 2 MG/2ML IJ SOLN
1.0000 mg | INTRAMUSCULAR | Status: DC | PRN
  Administered 2016-12-16 (×2): 2 mg via INTRAVENOUS
  Filled 2016-12-16: qty 2

## 2016-12-16 MED ORDER — FENTANYL CITRATE (PF) 100 MCG/2ML IJ SOLN: 25.0000 ug | INTRAMUSCULAR | Status: DC | PRN

## 2016-12-16 MED ORDER — ACETAMINOPHEN 650 MG RE SUPP: 650.0000 mg | Freq: Four times a day (QID) | RECTAL | Status: DC | PRN

## 2016-12-16 MED ORDER — DOCUSATE SODIUM 100 MG PO CAPS
100.0000 mg | ORAL_CAPSULE | Freq: Two times a day (BID) | ORAL | Status: DC
Start: 2016-12-16 — End: 2016-12-18
  Administered 2016-12-16: 100 mg via ORAL
  Filled 2016-12-16 (×2): qty 1

## 2016-12-16 MED ORDER — SODIUM BICARBONATE 8.4 % IV SOLN
INTRAVENOUS | Status: AC | PRN
  Administered 2016-12-16: 100 meq via INTRAVENOUS

## 2016-12-16 MED ORDER — DOPAMINE-DEXTROSE 3.2-5 MG/ML-% IV SOLN
INTRAVENOUS | Status: AC
  Administered 2016-12-16: 10 ug/kg/min via INTRAVENOUS
  Filled 2016-12-16: qty 250

## 2016-12-16 MED ORDER — MIDAZOLAM HCL 2 MG/2ML IJ SOLN
INTRAMUSCULAR | Status: AC
  Administered 2016-12-16: 2 mg via INTRAVENOUS
  Filled 2016-12-16: qty 2

## 2016-12-16 NOTE — Consult Note (Signed)
Name: Travis Maddox MRN: [id number] DOB: [birth year]     CONSULTATION DATE: 11/27/2016  REFERRING MD : Corky Downs  CHIEF COMPLAINT:  Respiratory failure  STUDIES:  Chest x-ray 11/23/2016 images reviewed ETT in place, right upper lobe opacification noted  HISTORY OF PRESENT ILLNESS:   61 year old white male with stage IV lung cancer per report was at a office visit at the cancer center when he subsequently was found unresponsive in the bathroom patient was in asystole PA CODE BLUE was called patient subsequently received CPR and was transferred to the emergency room and subsequently developed a second reactive arrest and in total time down time was 30 minutes of asystole PA CPR was provided and return of spontaneous circulation happened within 30 minutes  Patient was seen in the ER patient was intubated no sedation was used patient is comatose to patient with severe respiratory failure on full vent support at 100% FiO2 and on dopamine infusion  No family at bedside Patient remains critically ill and unstable and high risk for cardiac arrest and death  PAST MEDICAL HISTORY :   has a past medical history of Chronic fatigue; Colon polyps; Hyperlipemia; Hypertension; Hypothyroidism; Schizophrenia ([place]); Squamous cell carcinoma of right lung ([place]) (08/22/2014); and Wears dentures.  has a past surgical history that includes Colonoscopy; Cardiac catheterization (N/A, 08/21/2014); Endobronchial ultrasound (07/30/2014); Esophagogastroduodenoscopy (egd) with propofol (N/A, 08/15/2016); and Colonoscopy with propofol (N/A, 11/24/2016). Prior to Admission medications   Medication Sig Start Date End Date Taking? Authorizing Provider  atorvastatin (LIPITOR) 10 MG tablet Take 10 mg by mouth every morning.     [provider]  calcium carbonate (TUMS - DOSED IN MG ELEMENTAL CALCIUM) 500 MG chewable tablet Chew 1 tablet by mouth 2 (two) times daily.    [provider]  ferrous sulfate 325 (65 FE) MG  tablet Take 325 mg by mouth daily with breakfast.    [provider]  levothyroxine (SYNTHROID, LEVOTHROID) 88 MCG tablet Take 88 mcg by mouth every morning.     [provider]  lisinopril (PRINIVIL,ZESTRIL) 10 MG tablet Take 10 mg by mouth daily.    [provider]  OLANZapine (ZYPREXA) 20 MG tablet Take 20 mg by mouth at bedtime.     [provider]  ondansetron (ZOFRAN) 8 MG tablet TAKE 1 TABLET EVERY 8 HOURS AS NEEDED FOR NAUSEA AND VOMITING. [phone] DAYS AFTER CHEMO) Patient not taking: Reported on 12/13/2016 11/28/16   [name], MD  pantoprazole (PROTONIX) 40 MG tablet Take 1 tablet (40 mg total) by mouth daily. 08/15/16   [name], MD  predniSONE (DELTASONE) 20 MG tablet 2 pills a day with food x 14 days; and then once a day with food. 12/09/2016   [name], MD  prochlorperazine (COMPAZINE) 10 MG tablet TAKE 1 TABLET BY MOUTH EVERY 6 HOURS AS NEEDED FOR NAUSEA & VOMITING 11/02/16   [name], MD  propranolol (INDERAL) 10 MG tablet Take 10 mg by mouth 3 (three) times daily.    [provider]  sucralfate (CARAFATE) 1 g tablet Take 1 g by mouth 4 (four) times daily -  with meals and at bedtime.    [provider]   Allergies  Allergen Reactions  . Carboplatin Anaphylaxis    FAMILY HISTORY:  family history is not on file. SOCIAL HISTORY:  reports that he quit smoking about 3 years ago. His smoking use included Cigarettes. He has a 10.50 pack-year smoking history. He has never used  smokeless tobacco. He reports that he does not drink alcohol or use drugs.  REVIEW OF SYSTEMS:   Unable to provide due to critical illness  ALL OTHER ROS ARE NEGATIVE    VITAL SIGNS: Temp:  [98.2 F (36.8 C)] 98.2 F (36.8 C) (08/31 0903) Pulse Rate:  [78-123] 116 (08/31 1545) Resp:  [16-20] 16 (08/31 1545) BP: (75-147)/(44-87) 131/82 (08/31 1545) SpO2:  [97 %-100 %] 100 % (08/31 1545) FiO2 (%):  [40 %-100 %]  40 % (08/31 1545) Weight:  [200 lb 3.2 oz (90.8 kg)] 200 lb 3.2 oz (90.8 kg) (08/31 5956)  Physical Examination:  GENERAL:critically ill appearing, +resp distress HEAD: Normocephalic, atraumatic.  EYES: Pupils equal, round, reactive to light.  No scleral icterus.  MOUTH: Moist mucosal membrane. NECK: Supple. No thyromegaly. No nodules. No JVD.  PULMONARY: +rhonchi, +wheezing CARDIOVASCULAR: S1 and S2. Regular rate and rhythm. No murmurs, rubs, or gallops.  GASTROINTESTINAL: Soft, nontender, -distended. No masses. Positive bowel sounds. No hepatosplenomegaly.  MUSCULOSKELETAL: No swelling, clubbing, or edema.  NEUROLOGIC: obtunded SKIN:intact,warm,dry        Recent Labs Lab 12/01/2016 0837 11/26/2016 1452  NA 140 145  K 4.1 3.7  CL 104 108  CO2 30 21*  BUN 10 9  CREATININE 0.96 1.12  GLUCOSE 91 393*    Recent Labs Lab 12/04/2016 0837 12/08/2016 1452  HGB 8.2* 8.0*  HCT 24.5* 27.2*  WBC 5.0 4.8  PLT 472* 491*   Dg Abdomen 1 View  Result Date: 11/18/2016 CLINICAL DATA:  OG tube placement, status post cardiac arrest EXAM: ABDOMEN - 1 VIEW COMPARISON:  None. FINDINGS: Gastric distention. Enteric tube terminates in the mid gastric body. IMPRESSION: Enteric tube terminates in the mid gastric body. Electronically Signed   By: Julian Hy M.D.   On: 11/23/2016 15:24   Dg Chest Portable 1 View  Result Date: 12/11/2016 CLINICAL DATA:  Encounter for evaluation of ET tube placement. S/P cardiac arrest. EXAM: PORTABLE CHEST 1 VIEW COMPARISON:  04/23/2016 FINDINGS: Status post placement of endotracheal tube, tip 4.6 cm above the carina. Patient has a left-sided power port with tip overlying the level of superior vena cava. Nasogastric tube is in place, tip coiled back upon itself in the lower esophagus. Stomach is distended. Heart size is enlarged. There is right upper lobe opacity, better seen on recent chest CT but increased compared with earlier exams. IMPRESSION: 1. Interval  placement of endotracheal tube and nasogastric tube. 2. Nasogastric tube tip is coiled in the stomach, tip in the lower esophagus. Gaseous distension of the stomach. Recommend repositioning and nasogastric tube. 3. Right upper lobe mass/density. These results will be called to the ordering clinician or representative by the Radiologist Assistant, and communication documented in the PACS or zVision Dashboard. Electronically Signed   By: Nolon Nations M.D.   On: 11/16/2016 15:32    ASSESSMENT / PLAN: 61 year old white male with stage IV lung cancer admitted to the intensive care unit for acute cardiac arrest most likely from underlying acute coronary syndrome in the setting of stage IV lung cancer with prolonged CPR proximally 30 minutes and after further evaluation and assessment patient is not a candidate for hypothermia protocol due to his prolonged cardiac arrest and unfavorable outcome in the setting of terminal illness  #1 continue ventilator support Oxygen as needed #2 patient with probable underlying COPD we'll treat with aggressive bronchodilator therapy  IV steroids #3 shock most likely cardiogenic most likely related to underlying acute coronary syndrome possible PE Will  not anticoagulate at this time without further evaluation #4 end-stage lung cancer follow-up oncology recommendations  Overall prognosis is very poor patient with sudden cardiac arrest in the setting of terminal lung cancer will need to discuss with family about prognosis and plan for DNR/DNI discussion   Critical Care Time devoted to patient care services described in this note is 55 minutes.   Overall, patient is critically ill, prognosis is guarded.  Patient with Multiorgan failure and at high risk for cardiac arrest and death.    Corrin Parker, M.D.  [facility]  Medical Director [facility] Director [facility] Cardio-Pulmonary Department

## 2016-12-16 NOTE — Progress Notes (Signed)
Ch responded to a referral from an Therapist, sports. Pt Coded in the [place]. Pt was transferred to ED-02. Pt was being treated by the medical team on my arrival. [facility] spent time with his community support staff member who has served him for 20 years. She is considered family. She is attempting to make contact with Pt mother and brother. CH provided the ministry of hospitality, empathetic listening and prayer. Wenonah informed caregiver that spiritual support is available 24/7.    11/22/2016 1500  Clinical Encounter Type  Visited With Patient;Patient and family together;Health care provider  Visit Type Initial;Spiritual support;Code;ED (Code Blue)  Referral From Nurse  Consult/Referral To Chaplain  Spiritual Encounters  Spiritual Needs Prayer;Emotional

## 2016-12-16 NOTE — Progress Notes (Signed)
[place] OFFICE PROGRESS NOTE  Patient Care Team: [name], MD as PCP - General (Internal Medicine)  [place] OFFICE PROGRESS NOTE  Patient Care Team: [name], MD as PCP - General (Internal Medicine)  Squamous cell carcinoma of right lung [facility])   Staging form: Lung, AJCC 7th Edition     Clinical stage from 07/17/2014: Stage IV (T4, N0, M1a) - Signed by Leia Alf, MD on 09/29/2014    Oncology History   # April 2016- SQUAMOUS CELL of LUNG ca [RUL] STAGE III; [EBUS-Right hilum];  (? Stage IV Left lung nodule- not Bx)   # FEB 2017-STAGE IV [contralateral lung nodules] PET Recurrence/Progression [high risk for [facility] 2017- START Tecentriq q 3W x3; April 2017 CT- LUL Stable/slight increase in size [~61m]; new lingula ~227m improved RUL radiation changes; Improved basilar nodules ? Atypical or infectious etilogy; JUne 12th CT-STABLE LUL  # July 28th CT- Progression in lung [bil hand /knee swelling? Autoimmune AEs to Tecentriq]; AUG 10th [place]  # Schizophrenia/ group home resident; [facility]  # MOLECULAR TESTING- insuff sample; plan liquid biopsy     Malignant neoplasm of hilus of right lung (HCC)     INTERVAL HISTORY: Poor historian given his schizophrenia. Accompanied by caregiver.  [age]ear old male patient with above history ofMetastatic squamous cell lung cancer is here for follow-up- Currently status post cycle #1 of carbotaxol is for follow-up.  He tolerated chemotherapy fairly well- however continues to have mild swelling of his bilateral hands.  No diarrhea. No shortness of breath or cough. No hemoptysis. No skin rash. No headaches or vision changes.   REVIEW OF SYSTEMS:  Difficult to assess given his poor history giving skills.  PAST MEDICAL HISTORY :  Past Medical History:  Diagnosis Date  . Chronic fatigue   . Colon polyps   . Hyperlipemia   . Hypertension   .  Hypothyroidism   . Schizophrenia ([facility]  . Squamous [place] of right [place] ([place]2014  . Wears dentures    full upper and lower    PAST SURGICAL HISTORY :   Past Surgical History:  Procedure Laterality Date  . COLONOSCOPY     history of colon polyps  . COLONOSCOPY WITH PROPOFOL N/A 11/24/2016   Procedure: COLONOSCOPY WITH PROPOFOL;  Surgeon: AnJonathon BellowsMD;  [facility];  Service: Endoscopy;  Laterality: N/A;  . ENDOBRONCHIAL ULTRASOUND  07/30/2014   right hilum FNA/EBUS  . ESOPHAGOGASTRODUODENOSCOPY (EGD) WITH PROPOFOL N/A 08/15/2016   Procedure: ESOPHAGOGASTRODUODENOSCOPY (EGD) WITH PROPOFOL;  Surgeon: DaLucilla LameMD;  [place] [facility] Service: Endoscopy;  Laterality: N/A;  . PERIPHERAL VASCULAR CATHETERIZATION N/A 08/21/2014   Procedure: PoGlori Luisath Insertion;  Surgeon: JaAlgernon HuxleyMD;  [facility];  Service: Cardiovascular;  Laterality: N/A;    FAMILY HISTORY :  No family history on file.  SOCIAL HISTORY:   Social History  Substance Use Topics  . Smoking status: Former Smoker    Packs/day: 0.25    Years: 42.00    Types: Cigarettes    Quit date: 2015  . Smokeless tobacco: Never Used  . Alcohol use No    ALLERGIES:  is allergic to carboplatin.  MEDICATIONS:  No current facility-administered medications for this visit.    Current Outpatient Prescriptions  Medication Sig Dispense Refill  . atorvastatin (LIPITOR) 10 MG tablet Take 10 mg by mouth every morning.     . calcium carbonate (  TUMS - DOSED IN MG ELEMENTAL CALCIUM) 500 MG chewable tablet Chew 1 tablet by mouth 2 (two) times daily.    . ferrous sulfate 325 (65 FE) MG tablet Take 325 mg by mouth daily with breakfast.    . levothyroxine (SYNTHROID, LEVOTHROID) 88 MCG tablet Take 88 mcg by mouth every morning.     Marland Kitchen lisinopril (PRINIVIL,ZESTRIL) 10 MG tablet Take 10 mg by mouth daily.    Marland Kitchen OLANZapine (ZYPREXA) 20 MG tablet Take 20 mg by mouth at bedtime.     .  pantoprazole (PROTONIX) 40 MG tablet Take 1 tablet (40 mg total) by mouth daily. 30 tablet 11  . prochlorperazine (COMPAZINE) 10 MG tablet TAKE 1 TABLET BY MOUTH EVERY 6 HOURS AS NEEDED FOR NAUSEA & VOMITING 30 tablet 0  . propranolol (INDERAL) 10 MG tablet Take 10 mg by mouth 3 (three) times daily.    . sucralfate (CARAFATE) 1 g tablet Take 1 g by mouth 3 (three) times daily.     . ondansetron (ZOFRAN) 8 MG tablet TAKE 1 TABLET EVERY 8 HOURS AS NEEDED FOR NAUSEA AND VOMITING. (START3 DAYS AFTER CHEMO) (Patient not taking: Reported on 12/09/2016) 40 tablet 0  . predniSONE (DELTASONE) 20 MG tablet 2 pills a day with food x 14 days; and then once a day with food. (Patient not taking: Reported on 12/02/2016) 35 tablet 0   Facility-Administered Medications Ordered in Other Visits  Medication Dose Route Frequency Provider Last Rate Last Dose  . 0.9 %  sodium chloride infusion   Intravenous Continuous Epifanio Lesches, MD      . acetaminophen (TYLENOL) tablet 650 mg  650 mg Oral Q6H PRN Epifanio Lesches, MD       Or  . acetaminophen (TYLENOL) suppository 650 mg  650 mg Rectal Q6H PRN Epifanio Lesches, MD      . bisacodyl (DULCOLAX) EC tablet 5 mg  5 mg Oral Daily PRN Epifanio Lesches, MD      . dextrose 50 % solution   Intravenous PRN [name], MD   1 ampule at 12/12/2016 1441  . docusate sodium (COLACE) capsule 100 mg  100 mg Oral BID Epifanio Lesches, MD      . enoxaparin (LOVENOX) injection 40 mg  40 mg Subcutaneous Q24H Epifanio Lesches, MD      . EPINEPHrine (ADRENALIN) 1 MG/10ML injection   Intravenous PRN [name], MD   1 mg at 11/28/2016 1442  . ipratropium-albuterol (DUONEB) 0.5-2.5 (3) MG/3ML nebulizer solution 3 mL  3 mL Nebulization Q6H Epifanio Lesches, MD      . methylPREDNISolone sodium succinate (SOLU-MEDROL) 125 mg/2 mL injection 60 mg  60 mg Intravenous Q24H Epifanio Lesches, MD      . ondansetron (ZOFRAN) tablet 4 mg  4 mg Oral Q6H PRN Epifanio Lesches, MD       Or  . ondansetron (ZOFRAN) injection 4 mg  4 mg Intravenous Q6H PRN Epifanio Lesches, MD      . pegfilgrastim (NEULASTA ONPRO KIT) injection 6 mg  6 mg Subcutaneous Once Charlaine Dalton R, MD      . piperacillin-tazobactam (ZOSYN) IVPB 3.375 g  3.375 g Intravenous Once Epifanio Lesches, MD      . sodium bicarbonate 150 mEq in sterile water 1,000 mL infusion   Intravenous Continuous Flora Lipps, MD 125 mL/hr at 12/14/2016 1740    . vancomycin (VANCOCIN) IVPB 1000 mg/200 mL premix  1,000 mg Intravenous Once Epifanio Lesches, MD        PHYSICAL  EXAMINATION: ECOG PERFORMANCE STATUS: 0 - Asymptomatic  BP (!) 147/87 (BP Location: Left Arm, Patient Position: Sitting)   Pulse 78   Temp 98.2 F (36.8 C) (Tympanic)   Resp 16   Wt 200 lb 3.2 oz (90.8 kg)   BMI 27.15 kg/m   Filed Weights   11/19/2016 0903  Weight: 200 lb 3.2 oz (90.8 kg)    GENERAL: Well-nourished well-developed; Alert, no distress and comfortable.  Accompanied by caregiver. EYES: no pallor or icterus OROPHARYNX: no thrush or ulceration;Dentures NECK: supple, no masses felt LYMPH:  no palpable lymphadenopathy in the cervical, axillary or inguinal regions LUNGS: clear to auscultation and  No wheeze or crackles HEART/CVS: regular rate & rhythm and no murmurs; No lower extremity edema ABDOMEN:abdomen soft, non-tender and normal bowel sounds Musculoskeletal:no cyanosis of digits and no clubbing; Bilateral hand swelling noted/improved. PSYCH: alert & oriented x 3 with fluent speech NEURO: no focal motor/sensory deficits SKIN:  no rashes or significant lesions     LABORATORY DATA:  I have reviewed the data as listed    Component Value Date/Time   NA 145 11/20/2016 1452   NA 135 07/28/2014 1121   K 3.7 11/28/2016 1452   K 4.3 07/28/2014 1121   CL 108 11/17/2016 1452   CL 101 07/28/2014 1121   CO2 21 (L) 11/25/2016 1452   CO2 25 07/28/2014 1121   GLUCOSE 393 (H) 11/29/2016 1452    GLUCOSE 135 (H) 07/28/2014 1121   BUN 9 12/12/2016 1452   BUN 19 07/28/2014 1121   CREATININE 1.12 11/16/2016 1452   CREATININE 1.36 (H) 07/28/2014 1121   CALCIUM 8.4 (L) 11/18/2016 1452   CALCIUM 9.1 07/28/2014 1121   PROT 6.0 (L) 12/06/2016 1452   PROT 7.6 07/23/2014 1042   ALBUMIN 2.6 (L) 11/25/2016 1452   ALBUMIN 4.2 07/23/2014 1042   AST 65 (H) 12/15/2016 1452   AST 24 07/23/2014 1042   ALT 23 11/26/2016 1452   ALT 24 07/23/2014 1042   ALKPHOS 93 12/07/2016 1452   ALKPHOS 108 07/23/2014 1042   BILITOT 0.4 12/02/2016 1452   BILITOT 0.5 07/23/2014 1042   GFRNONAA >60 12/14/2016 1452   GFRNONAA 57 (L) 07/28/2014 1121   GFRAA >60 12/13/2016 1452   GFRAA >60 07/28/2014 1121    No results found for: SPEP, UPEP  Lab Results  Component Value Date   WBC 4.8 12/04/2016   NEUTROABS 3.4 12/07/2016   HGB 8.0 (L) 11/21/2016   HCT 27.2 (L) 11/24/2016   MCV 93.5 11/22/2016   PLT 491 (H) 12/09/2016      Chemistry      Component Value Date/Time   NA 145 11/30/2016 1452   NA 135 07/28/2014 1121   K 3.7 11/19/2016 1452   K 4.3 07/28/2014 1121   CL 108 12/13/2016 1452   CL 101 07/28/2014 1121   CO2 21 (L) 11/29/2016 1452   CO2 25 07/28/2014 1121   BUN 9 11/20/2016 1452   BUN 19 07/28/2014 1121   CREATININE 1.12 12/01/2016 1452   CREATININE 1.36 (H) 07/28/2014 1121      Component Value Date/Time   CALCIUM 8.4 (L) 12/07/2016 1452   CALCIUM 9.1 07/28/2014 1121   ALKPHOS 93 12/07/2016 1452   ALKPHOS 108 07/23/2014 1042   AST 65 (H) 11/30/2016 1452   AST 24 07/23/2014 1042   ALT 23 12/07/2016 1452   ALT 24 07/23/2014 1042   BILITOT 0.4 11/22/2016 1452   BILITOT 0.5 07/23/2014 1042  IIMPRESSION: 1. Today's study is highly concerning for recurrent disease with enlarging mass-like areas in the right upper lobe, and an enlarging nodule with developing satellite nodule in the left upper lobe near the apex, as detailed above. There is also a mildly enlarged right hilar  lymph node, and a new small right-sided pleural effusion. 2. Persistent circumferential thickening of the distal third of the esophagus. This may simply reflect esophagitis either from reflux or radiation therapy, however, if there is any clinical concern for Barrett's metaplasia or esophageal neoplasia, further evaluation with endoscopy could be considered. 3. Aortic atherosclerosis, in addition to three-vessel coronary artery disease. Please note that although the presence of coronary artery calcium documents the presence of coronary artery disease, the severity of this disease and any potential stenosis cannot be assessed on this non-gated CT examination. Assessment for potential risk factor modification, dietary therapy or pharmacologic therapy may be warranted, if clinically indicated. 4. Diffuse bronchial wall thickening with moderate to severe centrilobular and paraseptal emphysema; imaging findings suggestive of underlying COPD. 5. Additional incidental findings, as above.  Aortic Atherosclerosis (ICD10-I70.0) and Emphysema (ICD10-J43.9).   Electronically Signed   By: Vinnie Langton M.D.   On: 11/14/2016 09:23  RADIOGRAPHIC STUDIES: I have personally reviewed the radiological images as listed and agreed with the findings in the report. Dg Abdomen 1 View  Result Date: 12/07/2016 CLINICAL DATA:  Gastric tube retracted.  Status post cardiac arrest. EXAM: ABDOMEN - 1 VIEW COMPARISON:  Earlier today. FINDINGS: Normal bowel gas pattern without the previously demonstrated gaseous distention of the stomach. There is a suggestion of the distal portion of an orogastric tube with its tip in the proximal to mid stomach. However, no orogastric tube can be seen extending through the esophagus on the current image. Lumbar spine degenerative changes. IMPRESSION: There is a suggestion of the distal portion of an orogastric tube with its tip in the proximal to mid stomach. However, no  orogastric tube is seen extending through the esophagus on the current image. A portable chest radiograph may provide useful additional information regarding the tube status. Electronically Signed   By: Claudie Revering M.D.   On: 12/09/2016 16:27   Dg Abdomen 1 View  Result Date: 12/01/2016 CLINICAL DATA:  OG tube placement, status post cardiac arrest EXAM: ABDOMEN - 1 VIEW COMPARISON:  None. FINDINGS: Gastric distention. Enteric tube terminates in the mid gastric body. IMPRESSION: Enteric tube terminates in the mid gastric body. Electronically Signed   By: Julian Hy M.D.   On: 12/12/2016 15:24   Dg Chest Portable 1 View  Result Date: 11/23/2016 CLINICAL DATA:  Encounter for evaluation of ET tube placement. S/P cardiac arrest. EXAM: PORTABLE CHEST 1 VIEW COMPARISON:  04/23/2016 FINDINGS: Status post placement of endotracheal tube, tip 4.6 cm above the carina. Patient has a left-sided power port with tip overlying the level of superior vena cava. Nasogastric tube is in place, tip coiled back upon itself in the lower esophagus. Stomach is distended. Heart size is enlarged. There is right upper lobe opacity, better seen on recent chest CT but increased compared with earlier exams. IMPRESSION: 1. Interval placement of endotracheal tube and nasogastric tube. 2. Nasogastric tube tip is coiled in the stomach, tip in the lower esophagus. Gaseous distension of the stomach. Recommend repositioning and nasogastric tube. 3. Right upper lobe mass/density. These results will be called to the ordering clinician or representative by the Radiologist Assistant, and communication documented in the PACS or zVision Dashboard. Electronically Signed  By: Nolon Nations M.D.   On: 11/25/2016 15:32     ASSESSMENT & PLAN:  Malignant neoplasm of hilus of right lung ([place]) # SQUAMOUS Cell LUNG CA- STAGE IV- [ tumor nodule in the contralateral lung/ recurrence];  CT July 28th -Progression on Right UL/hilar mass; and also LUL  nodules. Currently on [place] Taxol s/p cycle #1.   # Proceed with carbo Taxol chemotherapy- #2. Labs today reviewed;  acceptable for treatment today- except for- [see below]  # anemia- Hb 8.2; HOLD tube in 10days;   # Swelling in hands- ? Tecentriq; recommend prednisone.    # Foundation one testing-not enough tissue; would recommend liquid biopsy today; agrees.   # labs in 10 days;hold tube; possible blood transfusion1 unit;  follow up with me in 3 weeks/chemo- on pro/MD.    Orders Placed This Encounter  Procedures  . CBC with Differential/Platelet    Standing Status:   Future    Standing Expiration Date:   12/16/2017  . Comprehensive metabolic panel    Standing Status:   Future    Standing Expiration Date:   12/16/2017  . CBC with Differential/Platelet    Standing Status:   Future    Standing Expiration Date:   12/16/2017  . Comprehensive metabolic panel    Standing Status:   Future    Standing Expiration Date:   12/16/2017  . Hold Tube- Blood Bank    Standing Status:   Future    Standing Expiration Date:   12/16/2017   All questions were answered. The patient knows to call the clinic with any problems, questions or concerns.      [name], MD 11/25/2016 6:42 PM       [name], MD 11/16/2016 6:42 PM

## 2016-12-16 NOTE — ED Notes (Signed)
Pulse resumed - transport initiated

## 2016-12-16 NOTE — Progress Notes (Signed)
Patient is here today for a follow up. Patient has noticed swelling of both hands

## 2016-12-16 NOTE — Progress Notes (Signed)
Carboplatin dose first received was 740mg  based on sCr 0.8. Today, SCr is 0.96, and calculated dose is more than 10% different at 640mg . Dose was changed to 640mg 

## 2016-12-16 NOTE — Code Documentation (Signed)
PEA during pulse check

## 2016-12-16 NOTE — Progress Notes (Signed)
Patient was transferred to ccu 7 on vent without difficulty

## 2016-12-16 NOTE — Progress Notes (Signed)
Pharmacy Antibiotic Note  Travis Maddox is a 61 y.o. male admitted on 12/08/2016 with sepsis.  Pharmacy has been consulted for vancomycin and Zosyn dosing.  Plan: Vancomycin 1250 mg  IV every 12 hours.  Goal trough 15-20 mcg/mL.   Zosyn 3.375 g IV q8h EI  Height: 6\' 2"  (188 cm) Weight: 210 lb 1.6 oz (95.3 kg) IBW/kg (Calculated) : 82.2  Temp (24hrs), Avg:98.3 F (36.8 C), Min:98.2 F (36.8 C), Max:98.3 F (36.8 C)   Recent Labs Lab 12/06/2016 0837 12/09/2016 1452 11/29/2016 1926  WBC 5.0 4.8  --   CREATININE 0.96 1.12  --   LATICACIDVEN  --  12.5* 3.7*    Estimated Creatinine Clearance: 81.5 mL/min (by C-G formula based on SCr of 1.12 mg/dL).    Allergies  Allergen Reactions  . Carboplatin Anaphylaxis    Antimicrobials this admission: Vanc/zosyn 8/31 >>  Dose adjustments this admission:   Microbiology results: 8/31 BCx: sent 8/31 UCx: sent  8/31 MRSA PCR: sent  Thank you for allowing pharmacy to be a part of this patient's care.  Rocky Morel 12/15/2016 9:02 PM

## 2016-12-16 NOTE — H&P (Signed)
[facility] NAME: Travis Maddox    MR#:  [id number]  DATE OF BIRTH:  June 15, 1955  DATE OF ADMISSION:  12/15/2016  PRIMARY CARE PHYSICIAN: [name], MD   REQUESTING/REFERRING PHYSICIAN: Dr. Conni Slipper  CHIEF COMPLAINT: Cardiac arrest    Chief Complaint  Patient presents with  . Cardiac Arrest    HISTORY OF PRESENT ILLNESS:  Travis Maddox  is a 61 y.o. male with a known history of Stage IV lung cancer getting chemotherapy at [facility] had a cardiac arrest this afternoon CODE BLUE was called, patient received epinephrine, atropine, bicarbonate,. Patient had CPR for about 30 minutes and transferred to emergency room, intubated this time. Patient was unresponsive in the bathroom and found to be in asystole, patient received CPR and transferred to emergency room and subsequently developed second cardiac arrest and total down time 30 minutes of asystole with spontaneous circulation. In 30 minutes. Patient at this time currently intubated without any sedation. He is on full vent support. 100% FiO2, /. Since mother is at the bedside. Patient is from group home and patient is a group home for almost more than 30 years.  PAST MEDICAL HISTORY:   Past Medical History:  Diagnosis Date  . Chronic fatigue   . Colon polyps   . Hyperlipemia   . Hypertension   . Hypothyroidism   . Schizophrenia ([place])   . 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 Wears dentures    full upper and lower    PAST SURGICAL HISTOIRY:   Past Surgical History:  Procedure Laterality Date  . COLONOSCOPY     history of colon polyps  . COLONOSCOPY WITH PROPOFOL N/A 11/24/2016   Procedure: COLONOSCOPY WITH PROPOFOL;  Surgeon: Jonathon Bellows, MD;  Location: [facility] ENDOSCOPY;  Service: Endoscopy;  Laterality: N/A;  . ENDOBRONCHIAL ULTRASOUND  07/30/2014   right hilum FNA/EBUS  . ESOPHAGOGASTRODUODENOSCOPY (EGD) WITH PROPOFOL N/A 08/15/2016    Procedure: ESOPHAGOGASTRODUODENOSCOPY (EGD) WITH PROPOFOL;  Surgeon: [name], MD;  Location: [facility];  Service: Endoscopy;  Laterality: N/A;  . PERIPHERAL VASCULAR CATHETERIZATION N/A 08/21/2014   Procedure: Glori Luis Cath Insertion;  Surgeon: Algernon Huxley, MD;  Location: [facility] CV LAB;  Service: Cardiovascular;  Laterality: N/A;    SOCIAL HISTORY:   Social History  Substance Use Topics  . Smoking status: Former Smoker    Packs/day: 0.25    Years: 42.00    Types: Cigarettes    Quit date: 2015  . Smokeless tobacco: Never Used  . Alcohol use No    FAMILY HISTORY:  No family history on file.  DRUG ALLERGIES:   Allergies  Allergen Reactions  . Carboplatin Anaphylaxis    REVIEW OF SYSTEMS:    unresponsive status post cardiac arrest MEDICATIONS AT HOME:   Prior to Admission medications   Medication Sig Start Date End Date Taking? Authorizing Provider  atorvastatin (LIPITOR) 10 MG tablet Take 10 mg by mouth every morning.    Yes [provider]  calcium carbonate (TUMS - DOSED IN MG ELEMENTAL CALCIUM) 500 MG chewable tablet Chew 1 tablet by mouth 2 (two) times daily.   Yes [provider]  ferrous sulfate 325 (65 FE) MG tablet Take 325 mg by mouth daily with breakfast.   Yes [provider]  levothyroxine (SYNTHROID, LEVOTHROID) 88 MCG tablet Take 88 mcg by mouth every morning.    Yes [provider]  lisinopril (PRINIVIL,ZESTRIL) 10 MG  tablet Take 10 mg by mouth daily.   Yes [provider]  OLANZapine (ZYPREXA) 20 MG tablet Take 20 mg by mouth at bedtime.    Yes [provider]  pantoprazole (PROTONIX) 40 MG tablet Take 1 tablet (40 mg total) by mouth daily. 08/15/16  Yes [name], MD  prochlorperazine (COMPAZINE) 10 MG tablet TAKE 1 TABLET BY MOUTH EVERY 6 HOURS AS NEEDED FOR NAUSEA & VOMITING 11/02/16  Yes [name], MD  propranolol (INDERAL) 10 MG tablet Take 10 mg by mouth 3 (three) times  daily.   Yes [provider]  sucralfate (CARAFATE) 1 g tablet Take 1 g by mouth 3 (three) times daily.    Yes [provider]  ondansetron (ZOFRAN) 8 MG tablet TAKE 1 TABLET EVERY 8 HOURS AS NEEDED FOR NAUSEA AND VOMITING. [phone] DAYS AFTER CHEMO) Patient not taking: Reported on 11/17/2016 11/28/16   [name], MD  predniSONE (DELTASONE) 20 MG tablet 2 pills a day with food x 14 days; and then once a day with food. Patient not taking: Reported on 12/12/2016 12/06/2016   [name], MD      VITAL SIGNS:  Blood pressure 126/74, pulse 93, resp. rate (!) 24, SpO2 90 %.  PHYSICAL EXAMINATION:  GENERAL:  61 y.o.-year-old patient lying in the bed,Currently intubated, sedated. Critically ill. pupils equal, round, reactive to light an HEENT: Head atraumatic, normocephalic,orallyintubated.  NECK: , no jugular venous distention. No thyroid enlargement,    LUNGS:wheezing bilaterally. CARDIOVASCULAR: S1, S2 normal. No murmurs, rubs, or gallops.  ABDOMEN: Obese bowel sounds present, no hepatosplenomegaly  EXTREMITIES: No pedal edema, cyanosis, or clubbing.  NEUROLOGIC: Obtunded, currently intubated.  PSYCHIATRIC:INTUBATED  SKIN: No obvious rash, lesion, or ulcer.   LABORATORY PANEL:   CBC  Recent Labs Lab 12/05/2016 1452  WBC 4.8  HGB 8.0*  HCT 27.2*  PLT 491*   ------------------------------------------------------------------------------------------------------------------  Chemistries   Recent Labs Lab 11/26/2016 1452  NA 145  K 3.7  CL 108  CO2 21*  GLUCOSE 393*  BUN 9  CREATININE 1.12  CALCIUM 8.4*  MG 2.3  AST 65*  ALT 23  ALKPHOS 93  BILITOT 0.4   ------------------------------------------------------------------------------------------------------------------  Cardiac Enzymes  Recent Labs Lab 11/25/2016 1452  TROPONINI <0.03    ------------------------------------------------------------------------------------------------------------------  RADIOLOGY:  Dg Abdomen 1 View  Result Date: 11/18/2016 CLINICAL DATA:  Gastric tube retracted.  Status post cardiac arrest. EXAM: ABDOMEN - 1 VIEW COMPARISON:  Earlier today. FINDINGS: Normal bowel gas pattern without the previously demonstrated gaseous distention of the stomach. There is a suggestion of the distal portion of an orogastric tube with its tip in the proximal to mid stomach. However, no orogastric tube can be seen extending through the esophagus on the current image. Lumbar spine degenerative changes. IMPRESSION: There is a suggestion of the distal portion of an orogastric tube with its tip in the proximal to mid stomach. However, no orogastric tube is seen extending through the esophagus on the current image. A portable chest radiograph may provide useful additional information regarding the tube status. Electronically Signed   By: Claudie Revering M.D.   On: 11/30/2016 16:27   Dg Abdomen 1 View  Result Date: 12/13/2016 CLINICAL DATA:  OG tube placement, status post cardiac arrest EXAM: ABDOMEN - 1 VIEW COMPARISON:  None. FINDINGS: Gastric distention. Enteric tube terminates in the mid gastric body. IMPRESSION: Enteric tube terminates in the mid gastric body. Electronically Signed   By: Henderson Newcomer.D.  On: 12/02/2016 15:24   Dg Chest Portable 1 View  Result Date: 12/07/2016 CLINICAL DATA:  Encounter for evaluation of ET tube placement. S/P cardiac arrest. EXAM: PORTABLE CHEST 1 VIEW COMPARISON:  04/23/2016 FINDINGS: Status post placement of endotracheal tube, tip 4.6 cm above the carina. Patient has a left-sided power port with tip overlying the level of superior vena cava. Nasogastric tube is in place, tip coiled back upon itself in the lower esophagus. Stomach is distended. Heart size is enlarged. There is right upper lobe opacity, better seen on recent chest CT  but increased compared with earlier exams. IMPRESSION: 1. Interval placement of endotracheal tube and nasogastric tube. 2. Nasogastric tube tip is coiled in the stomach, tip in the lower esophagus. Gaseous distension of the stomach. Recommend repositioning and nasogastric tube. 3. Right upper lobe mass/density. These results will be called to the ordering clinician or representative by the Radiologist Assistant, and communication documented in the PACS or zVision Dashboard. Electronically Signed   By: Nolon Nations M.D.   On: 12/07/2016 15:32    EKG:   Orders placed or performed during the hospital encounter of 11/30/2016  . ED EKG  . ED EKG  Post arrest EKG showed sinus tachycardia with normal QRS and no ST-T changes.   IMPRESSION AND PLAN:   Patient is 61 year old male with stage IV lung cancert with cardiac arrest prolonged CPR for about 30 minutes.  Cardiac arrest likely due to acute coronary syndrome or possible PE. Admit to intensive care unit, continue full vent support, pressors with dopamine, patient is on bicarbonate drip, continue IV steroids for history of tobacco abuse and COPD. #2 stage IV lung cancer, possible anaphylactic shock after carboplatin chemotherapy today as per oncology note. Patient is right now on dopamine. Patient condition is really critical and mother is aware of this. Consult palliative care.  #3 history of schizophrenia'patient is on medicines including olanzapine at group home for this. #4 .history of hypertension; hypotensive-year-old year now requiring pressors. #5, hyperlipidemia  condition is critical, overall high risk for cardiac arrest again.  All the records are reviewed and case discussed with ED provider. Management plans discussed with the patient, family and they are in agreement.  CODE STATUS: FULL TOTAL TIME TAKING CARE OF THIS PATIENT:55 minutes. (Critical care)   Syrenity Klepacki M.D on 12/09/2016 at 7:19 PM  Between 7am to 6pm -  Pager - [phone]  After 6pm go to [email or web address] - password [email or web address] [facility]  Office  [phone]  CC: Primary care physician; [name], MD  Note: This dictation was prepared with Dragon dictation along with smaller phrase technology. Any transcriptional errors that result from this process are unintentional.

## 2016-12-16 NOTE — Progress Notes (Signed)
eLink Physician-Brief Progress Note Patient Name: Travis Maddox DOB: [birth year] MRN: [id number]   Date of Service  11/25/2016  HPI/Events of Note  Patient evaluated by intensivist upon admission. Plan for family discussion of goals of care given lung cancer.   eICU Interventions  Continuing plan of care as per consulting service and admitting service      Intervention Category Major Interventions: Delirium, psychosis, severe agitation - evaluation and management Evaluation Type: New Patient Evaluation  Tera Partridge 12/07/2016, 7:54 PM

## 2016-12-16 NOTE — Code Documentation (Signed)
Intubated

## 2016-12-16 NOTE — Assessment & Plan Note (Addendum)
#   SQUAMOUS Cell LUNG CA- STAGE IV- [ tumor nodule in the contralateral lung/ recurrence];  CT July 28th -Progression on Right UL/hilar mass; and also LUL nodules. Currently on [place] Taxol s/p cycle #1.   # Proceed with carbo Taxol chemotherapy- #2. Labs today reviewed;  acceptable for treatment today- except for- [see below]  # anemia- Hb 8.2; HOLD tube in 10days;   # Swelling in hands- ? Tecentriq; recommend prednisone.    # Foundation one testing-not enough tissue; would recommend liquid biopsy today; agrees.   # labs in 10 days;hold tube; possible blood transfusion1 unit;  follow up with me in 3 weeks/chemo- on pro/MD.   Addendum: After patient received carboplatin- patient had anaphylactic shock cardiac arrest; resuscitated./Intubated- transferred to the emergency room. Patient currently on 2 pressors; 40% FiO2. Would recommend current level of care. Discussed the patient's caregiver; mother the overall serious condition.

## 2016-12-16 NOTE — Telephone Encounter (Signed)
After patient received carboplatin- patient had anaphylactic shock cardiac arrest; resuscitated./Intubated- transferred to the emergency room. Patient currently on 2 pressors; 40% FiO2. Would recommend current level of care. Discussed the patient's caregiver; mother the overall serious condition.   Dr.Yu is on call over the weekend.

## 2016-12-16 NOTE — Progress Notes (Signed)
eLink Physician-Brief Progress Note Patient Name: Travis Maddox DOB: Sep 09, 1955 MRN: [id number]   Date of Service  12/01/2016  HPI/Events of Note  Patient endotracheally intubated. No sedatives ordered.   eICU Interventions  1. Desired RASS score clarified 2. Versed ordered for sedation IV push 3. Fentanyl ordered IV when necessary for pain relief      Intervention Category Major Interventions: Delirium, psychosis, severe agitation - evaluation and management  Tera Partridge 12/05/2016, 7:53 PM

## 2016-12-16 NOTE — ED Triage Notes (Signed)
He arrives today - Code Blue from the cancer center infusion area - reported that the pt ambulated to the BR and collapsed  Code called  1415

## 2016-12-16 NOTE — Progress Notes (Signed)
1415 pt having reaction to carboplatin, drug stopped IVF's increased to 500cc.hr, benadyrl and steroids given as ordered, Dr Rogue Bussing called and here at chair side, code blue called, 1425 ER team here and assumed care of pt.

## 2016-12-16 NOTE — ED Notes (Signed)
Pt remains to have +2 radial pulse bilateral

## 2016-12-16 NOTE — Code Documentation (Signed)
Pulse check - ST 116

## 2016-12-16 NOTE — ED Provider Notes (Signed)
[facility] Emergency Department Provider Note   ____________________________________________    I have reviewed the triage vital signs and the nursing notes.   HISTORY  Chief Complaint Cardiac Arrest  Patient unable to provide history   HPI Travis Maddox is a 61 y.o. male who presents in cardiac arrest. I was called to the infusion center for [place]. Patient was apparently receiving an infusion and collapsed when he went to go to the bathroom. Patient's apparently being treated for lung cancer, no further history obtainable at the time   Past Medical History:  Diagnosis Date  . Chronic fatigue   . Colon polyps   . Hyperlipemia   . Hypertension   . Hypothyroidism   . Schizophrenia ([place])   . 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 Wears dentures    full upper and lower    Patient Active Problem List   Diagnosis Date Noted  . Goals of care, counseling/discussion 11/16/2016  . Bilateral hand swelling 08/24/2016  . Problems with swallowing and mastication   . Ulcer of esophagus without bleeding   . Gastritis without bleeding   . 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 Schizophrenia ([place]) 09/30/2015    Past Surgical History:  Procedure Laterality Date  . COLONOSCOPY     history of colon polyps  . COLONOSCOPY WITH PROPOFOL N/A 11/24/2016   Procedure: COLONOSCOPY WITH PROPOFOL;  Surgeon: Jonathon Bellows, MD;  Location: [facility] ENDOSCOPY;  Service: Endoscopy;  Laterality: N/A;  . ENDOBRONCHIAL ULTRASOUND  07/30/2014   right hilum FNA/EBUS  . ESOPHAGOGASTRODUODENOSCOPY (EGD) WITH PROPOFOL N/A 08/15/2016   Procedure: ESOPHAGOGASTRODUODENOSCOPY (EGD) WITH PROPOFOL;  Surgeon: [name], MD;  Location: [place];  Service: Endoscopy;  Laterality: N/A;  . PERIPHERAL VASCULAR CATHETERIZATION N/A 08/21/2014   Procedure: Glori Luis Cath Insertion;  Surgeon: Algernon Huxley, MD;  Location: [facility] CV LAB;   Service: Cardiovascular;  Laterality: N/A;    Prior to Admission medications   Medication Sig Start Date End Date Taking? Authorizing Provider  atorvastatin (LIPITOR) 10 MG tablet Take 10 mg by mouth every morning.     [provider]  calcium carbonate (TUMS - DOSED IN MG ELEMENTAL CALCIUM) 500 MG chewable tablet Chew 1 tablet by mouth 2 (two) times daily.    [provider]  ferrous sulfate 325 (65 FE) MG tablet Take 325 mg by mouth daily with breakfast.    [provider]  levothyroxine (SYNTHROID, LEVOTHROID) 88 MCG tablet Take 88 mcg by mouth every morning.     [provider]  lisinopril (PRINIVIL,ZESTRIL) 10 MG tablet Take 10 mg by mouth daily.    [provider]  OLANZapine (ZYPREXA) 20 MG tablet Take 20 mg by mouth at bedtime.     [provider]  ondansetron (ZOFRAN) 8 MG tablet TAKE 1 TABLET EVERY 8 HOURS AS NEEDED FOR NAUSEA AND VOMITING. [phone] DAYS AFTER CHEMO) Patient not taking: Reported on 12/15/2016 11/28/16   Travis Sickle, MD  pantoprazole (PROTONIX) 40 MG tablet Take 1 tablet (40 mg total) by mouth daily. 08/15/16   [name], MD  predniSONE (DELTASONE) 20 MG tablet 2 pills a day with food x 14 days; and then once a day with food. 11/19/2016   Travis Sickle, MD  prochlorperazine (COMPAZINE) 10 MG tablet TAKE 1 TABLET BY MOUTH EVERY 6 HOURS AS NEEDED FOR NAUSEA & VOMITING 11/02/16   Travis Sickle, MD  propranolol (INDERAL) 10 MG tablet Take 10 mg by mouth 3 (three) times daily.    [provider]  sucralfate (CARAFATE) 1 g tablet Take 1 g by mouth 4 (four) times daily -  with meals and at bedtime.    [provider]     Allergies Carboplatin  No family history on file.  Social History Social History  Substance Use Topics  . Smoking status: Former Smoker    Packs/day: 0.25    Years: 42.00    Types: Cigarettes    Quit date: 2015  . Smokeless tobacco: Never Used  . Alcohol  use No    Level V caveat: Unable to obtain Review of Systems due to altered mental status     ____________________________________________   PHYSICAL EXAM:  VITAL SIGNS: ED Triage Vitals [11/17/2016 1450]  Enc Vitals Group     BP 115/81     Pulse Rate (!) 115     Resp      Temp      Temp src      SpO2      Weight      Height      Head Circumference      Peak Flow      Pain Score      Pain Loc      Pain Edu?      Excl. in [place]?     Constitutional: unresponsive Eyes: reactive pupils Head: Atraumatic. Nose: No trauma Mouth/Throat: vomitus and mouth.    Cardiovascular: tachycardia, regular rhythm after ACLS successful. Grossly normal heart sounds.  Good peripheral circulation. Respiratory: intubated, bibasilar rales Gastrointestinal: soft, no significant distentions. Genitourinary: patient had episode of incontinence Musculoskeletal: No lower extremity edema Neurologic:  Unable to examine Skin:  Skin is warm, dry and intact. No rash noted. Psychiatric: unable to examine  ____________________________________________   LABS (all labs ordered are listed, but only abnormal results are displayed)  Labs Reviewed  URINE CULTURE  CULTURE, BLOOD (ROUTINE X 2)  CULTURE, BLOOD (ROUTINE X 2)  COMPREHENSIVE METABOLIC PANEL  CBC  TROPONIN I  BLOOD GAS, ARTERIAL  URINALYSIS, COMPLETE (UACMP) WITH MICROSCOPIC  LACTIC ACID, PLASMA  LACTIC ACID, PLASMA  MAGNESIUM  BRAIN NATRIURETIC PEPTIDE  APTT  PROTIME-INR  CBG MONITORING, ED   ____________________________________________  EKG  ED ECG REPORT I, Travis Maddox, the attending physician, personally viewed and interpreted this ECG.  Date: 12/13/2016 EKG Time: 2:51 PM Rate: 114 Rhythm: sinus tachycardia QRS Axis: normal Intervals: normal ST/T Wave abnormalities: nonspecific changes postarrest EKG  ____________________________________________  RADIOLOGY  ET tube in appropriate  position ____________________________________________   PROCEDURES  Procedure(s) performed: yes  INTUBATION Performed by: Travis Maddox  Required items: required blood products, implants, devices, and special equipment available Patient identity confirmed: provided demographic data and hospital-assigned identification number Time out: Immediately prior to procedure a "time out" was called to verify the correct patient, procedure, equipment, support staff and site/side marked as required.  Indications: cardiac arrest  Intubation method: Glidescope Laryngoscopy   Preoxygenation: BVM  Sedatives: none Paralytic: none  Tube Size: 7.5 cuffed  Post-procedure assessment: chest rise and ETCO2 monitor Breath sounds: equal and absent over the epigastrium Tube secured with: ETT holder Chest x-ray interpreted by radiologist and me.  Chest x-ray findings: endotracheal tube in appropriate position  Patient tolerated the procedure well with no immediate complications.       Critical Care performed: yes  CRITICAL CARE Performed by: Travis Maddox   Total critical care time:  40 minutes  Critical care time was exclusive of separately billable procedures and treating other patients.  Critical care was necessary to treat or prevent imminent or life-threatening deterioration.  Critical care was time spent personally by me on the following activities: development of treatment plan with patient and/or surrogate as well as nursing, discussions with consultants, evaluation of patient's response to treatment, examination of patient, obtaining history from patient or surrogate, ordering and performing treatments and interventions, ordering and review of laboratory studies, ordering and review of radiographic studies, pulse oximetry and re-evaluation of patient's condition.  ____________________________________________   INITIAL IMPRESSION / ASSESSMENT AND PLAN / ED COURSE  Pertinent  labs & imaging results that were available during my care of the patient were reviewed by me and considered in my medical decision making (see chart for details).  I was called to the [place] for CODE BLUE,ACLS was in progress. I took over, ROSC obtained. Patient transported back to the emergency department.did lose pulses again and ACLS was again performed, patient intubated.ROSC and dopamine started.caregiver is aware of patient's condition.  Dr. Mortimer Fries of ICU has seen the patient and recommended against cooling the patient  ----------------------------------------- 3:41 PM on 12/15/2016 -----------------------------------------  Contacted by radiology that NG tube needs to be pulled back slightly, RN will perform this    ____________________________________________   FINAL CLINICAL IMPRESSION(S) / ED DIAGNOSES  Final diagnoses:  Cardiac arrest ([place])      NEW MEDICATIONS STARTED DURING THIS VISIT:  New Prescriptions   No medications on file     Note:  This document was prepared using Dragon voice recognition software and may include unintentional dictation errors.    Travis Drafts, MD 11/23/2016 [phone]

## 2016-12-16 NOTE — Code Documentation (Signed)
Compressions held - no pulse  PEA

## 2016-12-16 NOTE — ED Notes (Signed)
Respiratory paged for patient transport to ICU.

## 2016-12-16 NOTE — Code Documentation (Signed)
asystole

## 2016-12-17 ENCOUNTER — Inpatient Hospital Stay: Payer: Medicare Other

## 2016-12-17 ENCOUNTER — Encounter: Payer: Self-pay | Admitting: Radiology

## 2016-12-17 DIAGNOSIS — I469 Cardiac arrest, cause unspecified: Secondary | ICD-10-CM

## 2016-12-17 DIAGNOSIS — E785 Hyperlipidemia, unspecified: Secondary | ICD-10-CM

## 2016-12-17 DIAGNOSIS — R4182 Altered mental status, unspecified: Secondary | ICD-10-CM

## 2016-12-17 DIAGNOSIS — C3402 Malignant neoplasm of left main bronchus: Secondary | ICD-10-CM

## 2016-12-17 DIAGNOSIS — I10 Essential (primary) hypertension: Secondary | ICD-10-CM

## 2016-12-17 DIAGNOSIS — Z86010 Personal history of colonic polyps: Secondary | ICD-10-CM

## 2016-12-17 DIAGNOSIS — E039 Hypothyroidism, unspecified: Secondary | ICD-10-CM

## 2016-12-17 DIAGNOSIS — R5382 Chronic fatigue, unspecified: Secondary | ICD-10-CM

## 2016-12-17 DIAGNOSIS — Z87891 Personal history of nicotine dependence: Secondary | ICD-10-CM

## 2016-12-17 DIAGNOSIS — Z7952 Long term (current) use of systemic steroids: Secondary | ICD-10-CM

## 2016-12-17 DIAGNOSIS — J9600 Acute respiratory failure, unspecified whether with hypoxia or hypercapnia: Secondary | ICD-10-CM

## 2016-12-17 DIAGNOSIS — T451X5A Adverse effect of antineoplastic and immunosuppressive drugs, initial encounter: Secondary | ICD-10-CM

## 2016-12-17 DIAGNOSIS — Z9911 Dependence on respirator [ventilator] status: Secondary | ICD-10-CM

## 2016-12-17 DIAGNOSIS — Z79899 Other long term (current) drug therapy: Secondary | ICD-10-CM

## 2016-12-17 DIAGNOSIS — I472 Ventricular tachycardia: Secondary | ICD-10-CM

## 2016-12-17 DIAGNOSIS — J9601 Acute respiratory failure with hypoxia: Secondary | ICD-10-CM

## 2016-12-17 DIAGNOSIS — Z9221 Personal history of antineoplastic chemotherapy: Secondary | ICD-10-CM

## 2016-12-17 DIAGNOSIS — Z9225 Personal history of immunosupression therapy: Secondary | ICD-10-CM

## 2016-12-17 DIAGNOSIS — F209 Schizophrenia, unspecified: Secondary | ICD-10-CM

## 2016-12-17 DIAGNOSIS — T782XXA Anaphylactic shock, unspecified, initial encounter: Secondary | ICD-10-CM

## 2016-12-17 DIAGNOSIS — M7989 Other specified soft tissue disorders: Secondary | ICD-10-CM

## 2016-12-17 LAB — TROPONIN I
TROPONIN I: 0.05 ng/mL — AB (ref <0.03)
Troponin I: 0.04 ng/mL (ref <0.03)
Troponin I: <0.03 ng/mL (ref <0.03)

## 2016-12-17 LAB — CBC
HCT: 21.4 % — ABNORMAL LOW (ref 40.0–52.0)
HEMATOCRIT: 23.2 % — AB (ref 40.0–52.0)
HEMOGLOBIN: 7.6 g/dL — AB (ref 13.0–18.0)
Hemoglobin: 7.1 g/dL — ABNORMAL LOW (ref 13.0–18.0)
MCH: 27.4 pg (ref 26.0–34.0)
MCH: 27.3 pg (ref 26.0–34.0)
MCHC: 32.8 g/dL (ref 32.0–36.0)
MCHC: 33 g/dL (ref 32.0–36.0)
MCV: 83.6 fL (ref 80.0–100.0)
MCV: 82.7 fL (ref 80.0–100.0)
PLATELETS: 258 10*3/uL (ref 150–440)
Platelets: 374 10*3/uL (ref 150–440)
RBC: 2.78 MIL/uL — AB (ref 4.40–5.90)
RBC: 2.59 MIL/uL — ABNORMAL LOW (ref 4.40–5.90)
RDW: 17 % — ABNORMAL HIGH (ref 11.5–14.5)
RDW: 17.9 % — AB (ref 11.5–14.5)
WBC: 11 10*3/uL — AB (ref 3.8–10.6)
WBC: 7.6 10*3/uL (ref 3.8–10.6)

## 2016-12-17 LAB — BLOOD GAS, ARTERIAL
ACID-BASE EXCESS: 5 mmol/L — AB (ref 0.0–2.0)
BICARBONATE: 29.1 mmol/L — AB (ref 20.0–28.0)
FIO2: 0.5
O2 Saturation: 98 %
PATIENT TEMPERATURE: 37
PCO2 ART: 40 mmHg (ref 32.0–48.0)
PEEP: 5 cmH2O
PH ART: 7.47 — AB (ref 7.350–7.450)
RATE: 20 resp/min
VT: 500 mL
pO2, Arterial: 97 mmHg (ref 83.0–108.0)

## 2016-12-17 LAB — BASIC METABOLIC PANEL
ANION GAP: 10 (ref 5–15)
ANION GAP: 8 (ref 5–15)
BUN: 18 mg/dL (ref 6–20)
BUN: 33 mg/dL — AB (ref 6–20)
CALCIUM: 7.3 mg/dL — AB (ref 8.9–10.3)
CO2: 27 mmol/L (ref 22–32)
CO2: 25 mmol/L (ref 22–32)
Calcium: 6.7 mg/dL — ABNORMAL LOW (ref 8.9–10.3)
Chloride: 107 mmol/L (ref 101–111)
Chloride: 110 mmol/L (ref 101–111)
Creatinine, Ser: 1.21 mg/dL (ref 0.61–1.24)
Creatinine, Ser: 1.51 mg/dL — ABNORMAL HIGH (ref 0.61–1.24)
GFR calc Af Amer: 56 mL/min — ABNORMAL LOW (ref >60)
GFR, EST NON AFRICAN AMERICAN: 49 mL/min — AB (ref >60)
GLUCOSE: 136 mg/dL — AB (ref 65–99)
Glucose, Bld: 142 mg/dL — ABNORMAL HIGH (ref 65–99)
POTASSIUM: 3.2 mmol/L — AB (ref 3.5–5.1)
POTASSIUM: 4.3 mmol/L (ref 3.5–5.1)
Sodium: 144 mmol/L (ref 135–145)
Sodium: 143 mmol/L (ref 135–145)

## 2016-12-17 LAB — MAGNESIUM
MAGNESIUM: 1.5 mg/dL — AB (ref 1.7–2.4)
MAGNESIUM: 2.2 mg/dL (ref 1.7–2.4)
Magnesium: 2.4 mg/dL (ref 1.7–2.4)

## 2016-12-17 LAB — LACTIC ACID, PLASMA
LACTIC ACID, VENOUS: 3.4 mmol/L — AB (ref 0.5–1.9)
Lactic Acid, Venous: 2 mmol/L (ref 0.5–1.9)

## 2016-12-17 LAB — PHOSPHORUS: PHOSPHORUS: 4 mg/dL (ref 2.5–4.6)

## 2016-12-17 LAB — PROTIME-INR
INR: 1.39
PROTHROMBIN TIME: 16.9 s — AB (ref 11.4–15.2)

## 2016-12-17 LAB — GLUCOSE, CAPILLARY: Glucose-Capillary: 156 mg/dL — ABNORMAL HIGH (ref 65–99)

## 2016-12-17 LAB — POTASSIUM: Potassium: 4 mmol/L (ref 3.5–5.1)

## 2016-12-17 LAB — APTT: aPTT: 44 seconds — ABNORMAL HIGH (ref 24–36)

## 2016-12-17 MED ORDER — SODIUM CHLORIDE 0.9 % IV BOLUS (SEPSIS)
1000.0000 mL | Freq: Once | INTRAVENOUS | Status: AC
  Administered 2016-12-17: 1000 mL via INTRAVENOUS

## 2016-12-17 MED ORDER — POTASSIUM CHLORIDE 10 MEQ/100ML IV SOLN
10.0000 meq | INTRAVENOUS | Status: DC
  Filled 2016-12-17 (×4): qty 100

## 2016-12-17 MED ORDER — ORAL CARE MOUTH RINSE
15.0000 mL | Freq: Four times a day (QID) | OROMUCOSAL | Status: DC
  Administered 2016-12-17 – 2016-12-18 (×5): 15 mL via OROMUCOSAL

## 2016-12-17 MED ORDER — IPRATROPIUM BROMIDE 0.02 % IN SOLN
0.5000 mg | Freq: Four times a day (QID) | RESPIRATORY_TRACT | Status: DC
  Administered 2016-12-18 (×2): 0.5 mg via RESPIRATORY_TRACT
  Filled 2016-12-17 (×2): qty 2.5

## 2016-12-17 MED ORDER — POTASSIUM CHLORIDE 10 MEQ/50ML IV SOLN
10.0000 meq | INTRAVENOUS | Status: AC
  Administered 2016-12-17 (×4): 10 meq via INTRAVENOUS
  Filled 2016-12-17 (×4): qty 50

## 2016-12-17 MED ORDER — AMIODARONE IV BOLUS ONLY 150 MG/100ML
150.0000 mg | Freq: Once | INTRAVENOUS | Status: AC
  Administered 2016-12-17: 150 mg via INTRAVENOUS
  Filled 2016-12-17: qty 100

## 2016-12-17 MED ORDER — SODIUM CHLORIDE 0.9 % IV BOLUS (SEPSIS): 1000.0000 mL | Freq: Once | INTRAVENOUS | Status: DC

## 2016-12-17 MED ORDER — POTASSIUM CHLORIDE 10 MEQ/50ML IV SOLN
10.0000 meq | INTRAVENOUS | Status: AC
  Administered 2016-12-17 (×2): 10 meq via INTRAVENOUS
  Filled 2016-12-17 (×2): qty 50

## 2016-12-17 MED ORDER — CHLORHEXIDINE GLUCONATE 0.12% ORAL RINSE (MEDLINE KIT)
15.0000 mL | Freq: Two times a day (BID) | OROMUCOSAL | Status: DC
  Administered 2016-12-17 – 2016-12-18 (×3): 15 mL via OROMUCOSAL

## 2016-12-17 MED ORDER — LEVALBUTEROL HCL 0.63 MG/3ML IN NEBU
0.6300 mg | INHALATION_SOLUTION | Freq: Four times a day (QID) | RESPIRATORY_TRACT | Status: DC
  Administered 2016-12-18 (×2): 0.63 mg via RESPIRATORY_TRACT
  Filled 2016-12-17 (×2): qty 3

## 2016-12-17 MED ORDER — AMIODARONE HCL IN DEXTROSE 360-4.14 MG/200ML-% IV SOLN
60.0000 mg/h | INTRAVENOUS | Status: DC
  Administered 2016-12-18 (×2): 60 mg/h via INTRAVENOUS
  Filled 2016-12-17 (×2): qty 200

## 2016-12-17 MED ORDER — MAGNESIUM SULFATE 4 GM/100ML IV SOLN
4.0000 g | Freq: Once | INTRAVENOUS | Status: AC
  Administered 2016-12-17: 4 g via INTRAVENOUS
  Filled 2016-12-17: qty 100

## 2016-12-17 MED ORDER — AMIODARONE HCL IN DEXTROSE 360-4.14 MG/200ML-% IV SOLN
60.0000 mg/h | INTRAVENOUS | Status: DC
  Administered 2016-12-17 – 2016-12-18 (×2): 60 mg/h via INTRAVENOUS
  Filled 2016-12-17 (×2): qty 200

## 2016-12-17 MED ORDER — PANTOPRAZOLE SODIUM 40 MG PO PACK
40.0000 mg | PACK | Freq: Every day | ORAL | Status: DC
  Administered 2016-12-17: 40 mg
  Filled 2016-12-17: qty 20

## 2016-12-17 MED ORDER — IOPAMIDOL (ISOVUE-370) INJECTION 76%
75.0000 mL | Freq: Once | INTRAVENOUS | Status: AC | PRN
  Administered 2016-12-17: 75 mL via INTRAVENOUS

## 2016-12-17 MED ORDER — AMIODARONE LOAD VIA INFUSION
150.0000 mg | Freq: Once | INTRAVENOUS | Status: AC
  Administered 2016-12-17: 150 mg via INTRAVENOUS
  Filled 2016-12-17: qty 83.34

## 2016-12-17 MED FILL — Medication: Qty: 1 | Status: AC

## 2016-12-17 NOTE — Progress Notes (Signed)
Pt heart rate sustained in the 130's. Notified [facility]. Awaiting call back.

## 2016-12-17 NOTE — Progress Notes (Signed)
eLink Physician-Brief Progress Note Patient Name: Travis Maddox DOB: [birth year] MRN: [id number]   Date of Service  12/17/2016  HPI/Events of Note  Patient developed rapid ventricular response with irregular rhythm. Suspicious for atrial fibrillation. Electrolytes normal. Borderline hypertension.   eICU Interventions  1. Normal saline 1 L bolus 2. PCCM NP at bedside addressing w/ antiarrhythmic      Intervention Category Major Interventions: Arrhythmia - evaluation and management  Tera Partridge 12/17/2016, 8:50 PM

## 2016-12-17 NOTE — Consult Note (Signed)
MEDICATION RELATED CONSULT NOTE - INITIAL   Pharmacy Consult for electrolytes Indication: hypokalemia, hypomagnesium  Allergies  Allergen Reactions  . Carboplatin Anaphylaxis    Patient Measurements: Height: 6' 2"  (188 cm) Weight: 210 lb 1.6 oz (95.3 kg) IBW/kg (Calculated) : 82.2 Adjusted Body Weight:   Vital Signs: Temp: 98.6 F (37 C) (09/01 2000) Temp Source: Oral (09/01 2000) BP: 105/89 (09/01 2200) Pulse Rate: 79 (09/01 2200) Intake/Output from previous day: 08/31 0701 - 09/01 0700 In: 2566.7 [I.V.:1216.7; IV Piggyback:1350] Out: 3009 [Urine:1170] Intake/Output from this shift: No intake/output data recorded.  Labs:  Recent Labs  12/03/2016 0837  12/06/2016 1452 11/26/2016 2052 12/17/16 0132 12/17/16 0727 12/17/16 1911 12/17/16 2125  WBC 5.0  --  4.8 12.3* 11.0*  --   --  7.6  HGB 8.2*  --  8.0* 8.5* 7.6*  --   --  7.1*  HCT 24.5*  --  27.2* 26.7* 23.2*  --   --  21.4*  PLT 472*  --  491* 500* 374  --   --  258  APTT  --   --  48*  --   --   --   --  44*  CREATININE 0.96  --  1.12 1.13 1.21  --  1.51*  --   MG  --   < > 2.3 1.8  --  1.5* 2.4 2.2  PHOS  --   --   --  3.8  --  4.0  --   --   ALBUMIN 3.2*  --  2.6*  --   --   --   --   --   PROT 6.9  --  6.0*  --   --   --   --   --   AST 33  --  65*  --   --   --   --   --   ALT 16*  --  23  --   --   --   --   --   ALKPHOS 84  --  93  --   --   --   --   --   BILITOT 0.3  --  0.4  --   --   --   --   --   < > = values in this interval not displayed. Estimated Creatinine Clearance: 60.5 mL/min (A) (by C-G formula based on SCr of 1.51 mg/dL (H)).   Microbiology: Recent Results (from the past 720 hour(s))  Blood culture (routine x 2)     Status: None (Preliminary result)   Collection Time: 12/15/2016  2:53 PM  Result Value Ref Range Status   Specimen Description BLOOD BLOOD RIGHT FOREARM  Final   Special Requests   Final    BOTTLES DRAWN AEROBIC AND ANAEROBIC Blood Culture adequate volume   Culture NO  GROWTH < 24 HOURS  Final   Report Status PENDING  Incomplete  Blood culture (routine x 2)     Status: None (Preliminary result)   Collection Time: 11/19/2016  2:58 PM  Result Value Ref Range Status   Specimen Description BLOOD RIGHT ANTECUBITAL  Final   Special Requests   Final    BOTTLES DRAWN AEROBIC AND ANAEROBIC Blood Culture adequate volume   Culture NO GROWTH < 24 HOURS  Final   Report Status PENDING  Incomplete  MRSA PCR Screening     Status: None   Collection Time: 11/25/2016  8:01 PM  Result Value Ref Range Status  MRSA by PCR NEGATIVE NEGATIVE Final    Comment:        The GeneXpert MRSA Assay (FDA approved for NASAL specimens only), is one component of a comprehensive MRSA colonization surveillance program. It is not intended to diagnose MRSA infection nor to guide or monitor treatment for MRSA infections.     Medical History: Past Medical History:  Diagnosis Date  . Chronic fatigue   . Colon polyps   . Hyperlipemia   . Hypertension   . Hypothyroidism   . Schizophrenia ([place])   . 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 Wears dentures    full upper and lower    Medications:  Scheduled:  . chlorhexidine gluconate (MEDLINE KIT)  15 mL Mouth Rinse BID  . docusate sodium  100 mg Oral BID  . enoxaparin (LOVENOX) injection  40 mg Subcutaneous Q24H  . ipratropium  0.5 mg Nebulization Q6H  . levalbuterol  0.63 mg Nebulization Q6H  . mouth rinse  15 mL Mouth Rinse QID  . methylPREDNISolone (SOLU-MEDROL) injection  60 mg Intravenous Q24H  . pantoprazole sodium  40 mg Per Tube Q1200    Assessment: Patient is a 61 year old male who is admitted s/p cardiac arrest. Pt currently intubated. Pharmacy consulted to replace electroyles. K=3.2 Mg=1.5 Phos=4  Goal of Therapy:  K= 4-5 Mg=2-2.4 Phos=2.5-4.6  Plan:  Mg IV 4g once KCL IV 10 MEQ x 4 Recheck electrolytes at 2200  9/1 2130 K+ and Mg WNL. Recheck BMP and magnesium with AM labs.  Sim Boast, PharmD, BCPS  12/17/16 11:03 PM

## 2016-12-17 NOTE — Progress Notes (Signed)
eLink Physician-Brief Progress Note Patient Name: Travis Maddox DOB: October 26, 1955 MRN: [id number]   Date of Service  12/17/2016  HPI/Events of Note  Notified of arrhythmia on telemetry. Personally reviewed by me which shows intermittent supraventricular beats. Magnesium 1.5 this morning & potassium low was wall. Replacement ordered which appears to been started earlier this afternoon.   eICU Interventions  1. Repeating basic metabolic panel and magnesium 2. Continuous telemetry monitoring      Intervention Category Major Interventions: Electrolyte abnormality - evaluation and management;Arrhythmia - evaluation and management  Tera Partridge 12/17/2016, 6:33 PM

## 2016-12-17 NOTE — Consult Note (Signed)
MEDICATION RELATED CONSULT NOTE - INITIAL   Pharmacy Consult for electrolytes Indication: hypokalemia, hypomagnesium  Allergies  Allergen Reactions  . Carboplatin Anaphylaxis    Patient Measurements: Height: 6' 2"  (188 cm) Weight: 210 lb 1.6 oz (95.3 kg) IBW/kg (Calculated) : 82.2 Adjusted Body Weight:   Vital Signs: Temp: 100.2 F (37.9 C) (09/01 1300) Temp Source: Oral (09/01 1300) BP: 118/67 (09/01 1300) Pulse Rate: 117 (09/01 1300) Intake/Output from previous day: 08/31 0701 - 09/01 0700 In: 2566.7 [I.V.:1216.7; IV Piggyback:1350] Out: 1170 [Urine:1170] Intake/Output from this shift: Total I/O In: 638.5 [I.V.:338.5; IV Piggyback:300] Out: -   Labs:  Recent Labs  12/05/2016 0837 11/29/2016 1452 12/14/2016 2052 12/17/16 0132 12/17/16 0727  WBC 5.0 4.8 12.3* 11.0*  --   HGB 8.2* 8.0* 8.5* 7.6*  --   HCT 24.5* 27.2* 26.7* 23.2*  --   PLT 472* 491* 500* 374  --   APTT  --  48*  --   --   --   CREATININE 0.96 1.12 1.13 1.21  --   MG  --  2.3 1.8  --  1.5*  PHOS  --   --  3.8  --  4.0  ALBUMIN 3.2* 2.6*  --   --   --   PROT 6.9 6.0*  --   --   --   AST 33 65*  --   --   --   ALT 16* 23  --   --   --   ALKPHOS 84 93  --   --   --   BILITOT 0.3 0.4  --   --   --    Estimated Creatinine Clearance: 75.5 mL/min (by C-G formula based on SCr of 1.21 mg/dL).   Microbiology: Recent Results (from the past 720 hour(s))  Blood culture (routine x 2)     Status: None (Preliminary result)   Collection Time: 11/25/2016  2:53 PM  Result Value Ref Range Status   Specimen Description BLOOD BLOOD RIGHT FOREARM  Final   Special Requests   Final    BOTTLES DRAWN AEROBIC AND ANAEROBIC Blood Culture adequate volume   Culture NO GROWTH < 24 HOURS  Final   Report Status PENDING  Incomplete  Blood culture (routine x 2)     Status: None (Preliminary result)   Collection Time: 11/19/2016  2:58 PM  Result Value Ref Range Status   Specimen Description BLOOD RIGHT ANTECUBITAL  Final   Special Requests   Final    BOTTLES DRAWN AEROBIC AND ANAEROBIC Blood Culture adequate volume   Culture NO GROWTH < 24 HOURS  Final   Report Status PENDING  Incomplete  MRSA PCR Screening     Status: None   Collection Time: 11/29/2016  8:01 PM  Result Value Ref Range Status   MRSA by PCR NEGATIVE NEGATIVE Final    Comment:        The GeneXpert MRSA Assay (FDA approved for NASAL specimens only), is one component of a comprehensive MRSA colonization surveillance program. It is not intended to diagnose MRSA infection nor to guide or monitor treatment for MRSA infections.     Medical History: Past Medical History:  Diagnosis Date  . Chronic fatigue   . Colon polyps   . Hyperlipemia   . Hypertension   . Hypothyroidism   . Schizophrenia ([place])   . 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 Wears dentures    full upper and lower    Medications:  Scheduled:  . chlorhexidine gluconate (MEDLINE KIT)  15 mL Mouth Rinse BID  . docusate sodium  100 mg Oral BID  . enoxaparin (LOVENOX) injection  40 mg Subcutaneous Q24H  . ipratropium-albuterol  3 mL Nebulization Q6H  . mouth rinse  15 mL Mouth Rinse QID  . methylPREDNISolone (SOLU-MEDROL) injection  60 mg Intravenous Q24H  . pantoprazole sodium  40 mg Per Tube Q1200    Assessment: Patient is a 61 year old male who is admitted s/p cardiac arrest. Pt currently intubated. Pharmacy consulted to replace electroyles. K=3.2 Mg=1.5 Phos=4  Goal of Therapy:  K= 4-5 Mg=2-2.4 Phos=2.5-4.6  Plan:  Mg IV 4g once KCL IV 10 MEQ x 4 Recheck electrolytes at [place], Pharm.D, BCPS Clinical Pharmacist  12/17/2016,2:02 PM

## 2016-12-17 NOTE — Progress Notes (Signed)
[facility] Progess Note     ASSESSMENT  S/P ASYSTOLIC CARDIAC ARREST ACUTE HYPERCAPNIC RESPIRATORY FAILURE ALTERED MENTAL STATUS ? 2 TO ANOXIC ENCEPHALOPATHY STAGE 4 LUNG CANCER ANEMIA HYPOKALEMIA HYPOMAGNESEMIA  PLAN: Will continue full vent support at this time. CTA negative for PE. Borderline troponins. On BD and broad spectrum antibiotics.  Repeat ABG and check lactic acid. Replace lytes as needed,  No indication for transfusion at this time. Monitor mental status closely. Minimize sedating meds. DVT/GI/VAP prophylaxis. Full COde. No family at bedside.  Cc: 32 minutes  Dimas Chyle MD     CHIEF COMPLAINT:   61 year old white male with stage IV lung cancer per report was at a office visit at the cancer center when he subsequently was found unresponsive in the bathroom patient was in asystole PA CODE BLUE was called patient subsequently received CPR and was transferred to the emergency room and subsequently developed a second reactive arrest and in total time down time was 30 minutes of asystole PA CPR was provided and return of spontaneous circulation happened within 30 minutes     SUBJECTIVE:  Patient seen and examined at bedside. No acute events overnight. CT chest independently reviewed.  Sedated.   VITAL SIGNS: Temp:  [98.3 F (36.8 C)-99.7 F (37.6 C)] 99.7 F (37.6 C) (09/01 1200) Pulse Rate:  [87-126] 111 (09/01 1200) Resp:  [0-32] 14 (09/01 1200) BP: (75-163)/(44-100) 106/65 (09/01 1200) SpO2:  [86 %-100 %] 95 % (09/01 1200) FiO2 (%):  [40 %-100 %] 50 % (09/01 0800) Weight:  [210 lb 1.6 oz (95.3 kg)] 210 lb 1.6 oz (95.3 kg) (09/01 0441)     PHYSICAL EXAMINATION: Physical Examination:   VS: BP 106/65 (BP Location: Left Arm)   Pulse (!) 111   Temp 99.7 F (37.6 C) (Oral)   Resp 14   Ht 6\' 2"  (1.88 m)   Wt 210 lb 1.6 oz (95.3 kg)   SpO2 95%   BMI 26.98 kg/m    General Appearance: No distress  Neuro:  sedated HEENT: ETT+ Pulmonary: normal breath sounds   Cardiovascular: Normal S1,S2.  No m/r/g.   Abdomen: Benign, Soft, non-tender. Endocrine: No evident thyromegaly. Skin:  warm, no rashes, no ecchymosis  Extremities: normal, no cyanosis, clubbing.    LABORATORY PANEL:   CBC  Recent Labs Lab 12/17/16 0132  WBC 11.0*  HGB 7.6*  HCT 23.2*  PLT 374    Chemistries   Recent Labs Lab 12/03/2016 1452  12/17/16 0132 12/17/16 0727  NA 145  < > 144  --   K 3.7  < > 3.2*  --   CL 108  < > 107  --   CO2 21*  < > 27  --   GLUCOSE 393*  < > 142*  --   BUN 9  < > 18  --   CREATININE 1.12  < > 1.21  --   CALCIUM 8.4*  < > 7.3*  --   MG 2.3  < >  --  1.5*  PHOS  --   < >  --  4.0  AST 65*  --   --   --   ALT 23  --   --   --   ALKPHOS 93  --   --   --   BILITOT 0.4  --   --   --   < > = values in this interval not displayed.   Recent Labs Lab 11/27/2016 2000 12/17/16 8416  GLUCAP 244* 156*    Recent Labs Lab 12/15/2016 1510 12/05/2016 2000  PHART 6.95* 7.28*  PCO2ART 77* 60*  PO2ART 358* 56*    Recent Labs Lab 12/09/2016 0837 11/30/2016 1452  AST 33 65*  ALT 16* 23  ALKPHOS 84 93  BILITOT 0.3 0.4  ALBUMIN 3.2* 2.6*    Cardiac Enzymes  Recent Labs Lab 12/17/16 0727  TROPONINI 0.04*    RADIOLOGY:  Dg Abdomen 1 View  Result Date: 12/04/2016 CLINICAL DATA:  Gastric tube retracted.  Status post cardiac arrest. EXAM: ABDOMEN - 1 VIEW COMPARISON:  Earlier today. FINDINGS: Normal bowel gas pattern without the previously demonstrated gaseous distention of the stomach. There is a suggestion of the distal portion of an orogastric tube with its tip in the proximal to mid stomach. However, no orogastric tube can be seen extending through the esophagus on the current image. Lumbar spine degenerative changes. IMPRESSION: There is a suggestion of the distal portion of an orogastric tube with its tip in the proximal to mid stomach. However, no orogastric tube is seen  extending through the esophagus on the current image. A portable chest radiograph may provide useful additional information regarding the tube status. Electronically Signed   By: Claudie Revering M.D.   On: 12/12/2016 16:27   Dg Abdomen 1 View  Result Date: 11/26/2016 CLINICAL DATA:  OG tube placement, status post cardiac arrest EXAM: ABDOMEN - 1 VIEW COMPARISON:  None. FINDINGS: Gastric distention. Enteric tube terminates in the mid gastric body. IMPRESSION: Enteric tube terminates in the mid gastric body. Electronically Signed   By: Julian Hy M.D.   On: 12/04/2016 15:24   Ct Angio Chest Pe W Or Wo Contrast  Result Date: 12/17/2016 CLINICAL DATA:  Stage IV lung cancer getting chemotherapy at [facility] had a cardiac arrest this afternoon CODE BLUE was called, patient received epinephrine, atropine, bicarbonate,. Patient had CPR for about 30 minutes and transferred to emergency room, intubated this time. Patient was unresponsive in the bathroom and found to be in asystole, patient received CPR and transferred to emergency room and subsequently developed second cardiac arrest and total down time 30 minutes of asystole with spontaneous circulation. In 30 minutes. Patient at this time currently intubated without any sedation. He is on full vent support EXAM: CT ANGIOGRAPHY CHEST WITH CONTRAST TECHNIQUE: Multidetector CT imaging of the chest was performed using the standard protocol during bolus administration of intravenous contrast. Multiplanar CT image reconstructions and MIPs were obtained to evaluate the vascular anatomy. CONTRAST:  75 mL Isovue 370 IV COMPARISON:  11/14/2016 FINDINGS: Cardiovascular: Left IJ port catheter extends to the distal SVC. Intraluminal Filling defect associated with catheter tip suggesting adherent fibrin sheath/thrombus. SVC remains patent. Satisfactory opacification of pulmonary arteries noted, and there is no evidence of acute pulmonary emboli. Caliber changes in the  right upper lobe branches of the pulmonary artery probably related to neoplasm as these were present on prior scan. Scattered coronary calcifications. Adequate contrast opacification of the thoracic aorta with no evidence of dissection, aneurysm, or stenosis. There is classic 3-vessel brachiocephalic arch anatomy without proximal stenosis. Mild calcified plaque in the aortic arch. Mediastinum/Nodes: No pericardial effusion. Stable 1 cm right hilar node. No new mediastinal adenopathy. Endotracheal tube to the distal trachea. Nasogastric tube placement to the stomach. Lungs/Pleura: Persistent right suprahilar and contiguous apical masslike consolidation. Resolution of right pleural effusion since prior study. 1.1 cm and 1.9 cm left apical pulmonary nodules as before. New extensive patchy airspace opacities in  the superior and posterior basal segments of both lower lobes with more dense basilar consolidation left greater than right. Upper Abdomen: No acute abnormality. Musculoskeletal: No chest wall abnormality. No acute or significant osseous findings. Review of the MIP images confirms the above findings. IMPRESSION: 1. Negative for acute PE or thoracic aortic dissection. 2. New extensive patchy airspace opacities in both lower lobes probably infectious/inflammatory. 3. Coronary and Aortic Atherosclerosis (ICD10-170.0). 4. Stable right apical/suprahilar mass and left apical pulmonary nodules. 5. Interval resolution of right pleural effusion 6. Fibrin sheath/thrombus at the tip of the port catheter with continued patency of the SVC. Electronically Signed   By: Lucrezia Europe M.D.   On: 12/17/2016 12:08   Dg Chest Port 1 View  Result Date: 12/17/2016 CLINICAL DATA:  Respiratory failure EXAM: PORTABLE CHEST 1 VIEW COMPARISON:  12/07/2016 FINDINGS: Endotracheal tube tip is about 3.2 cm superior to the carina. Esophageal tube tip is below the diaphragm. Left-sided central venous port tip overlies the SVC. Development of  vascular congestion and interstitial and alveolar disease left greater than right. Stable cardiomediastinal silhouette. IMPRESSION: 1. Support lines and tubes as above 2. Development of vascular congestion and diffuse interstitial and alveolar opacity left greater than right which may reflect edema, although pneumonia may also produce this appearance Electronically Signed   By: Donavan Foil M.D.   On: 12/17/2016 03:58   Dg Chest Portable 1 View  Result Date: 11/19/2016 CLINICAL DATA:  Encounter for evaluation of ET tube placement. S/P cardiac arrest. EXAM: PORTABLE CHEST 1 VIEW COMPARISON:  04/23/2016 FINDINGS: Status post placement of endotracheal tube, tip 4.6 cm above the carina. Patient has a left-sided power port with tip overlying the level of superior vena cava. Nasogastric tube is in place, tip coiled back upon itself in the lower esophagus. Stomach is distended. Heart size is enlarged. There is right upper lobe opacity, better seen on recent chest CT but increased compared with earlier exams. IMPRESSION: 1. Interval placement of endotracheal tube and nasogastric tube. 2. Nasogastric tube tip is coiled in the stomach, tip in the lower esophagus. Gaseous distension of the stomach. Recommend repositioning and nasogastric tube. 3. Right upper lobe mass/density. These results will be called to the ordering clinician or representative by the Radiologist Assistant, and communication documented in the PACS or zVision Dashboard. Electronically Signed   By: Nolon Nations M.D.   On: 11/21/2016 15:32           12/17/2016

## 2016-12-17 NOTE — Progress Notes (Addendum)
Patient has a MAP of 55. [place] notified. Received orders to administer 1L NS bolus.

## 2016-12-17 NOTE — Progress Notes (Addendum)
Pt HR got up to 153 for several seconds. Pt is in sinus tach and heart rate quickly came back down to 119-110. MD notified. No new orders given.  Made MD aware of hbg of 7.6, mag of 1.5 and K+ of 3.2.

## 2016-12-17 NOTE — Progress Notes (Signed)
Pt went into afib with RVR rates from 180 - 206. Given 1 liter bolus, amio bolus x 2 and started on amio drip.

## 2016-12-17 NOTE — Progress Notes (Addendum)
Pt heart rate jumped up to 210. MD notified. Received orders to get an EKG the next time it happens.

## 2016-12-17 NOTE — Progress Notes (Signed)
eLink Physician-Brief Progress Note Patient Name: Travis Maddox DOB: 1955/11/08 MRN: [id number]   Date of Service  12/17/2016  HPI/Events of Note  SUP  eICU Interventions  protonix     Intervention Category Intermediate Interventions: Best-practice therapies (e.g. DVT, beta blocker, etc.)  Travis Maddox V. 12/17/2016, 12:04 AM

## 2016-12-17 NOTE — Consult Note (Addendum)
Hematology/Oncology Consult note [facility] Telephone:([phone] Fax:([phone]  Patient Care Team: [name], MD as PCP - General (Internal Medicine)   Name of the patient: Travis Maddox  [id number]  18-Jun-1955    Reason for referral- Stage IV lung cancer   Referring physician- Dr.Konidena  Date of visit: 12/17/16  History of presenting illness-  This is a 61yo male known to oncology service with a known history of Stage IV lung squamous cancer ([id number]), first diagnosed in 2016, treated with immunotherapy, disease progression in July 2018, currently on second line treatment with carboplatin and Taxol.  Yesterday was Day 1 of cycle 2 Carbo and Taxol. He received Taxol and when he is getting Carboplatin at [place], he had anaphylactic reaction with cardiac arrest.  CODE BLUE was called, patient received epinephrine, atropine, bicarbonate,. Patient had CPR for about 30 minutes with ROSC, transferred to hospital, subsequently another cardiac arrest with asystole, CPR and ROSC in 30 minutes. He was intubated currently on Ventilation support. He is currently intubated and sedated.    Review of systems- ROS Can not evaluate as he is intubated and altered mental status.   Allergies  Allergen Reactions  . Carboplatin Anaphylaxis    Patient Active Problem List   Diagnosis Date Noted  . Cardiac arrest ([place]) 12/01/2016  . Goals of care, counseling/discussion 11/16/2016  . Bilateral hand swelling 08/24/2016  . Problems with swallowing and mastication   . Ulcer of esophagus without bleeding   . Gastritis without bleeding   . Malignant neoplasm of hilus of right lung ([place]) 10/28/2015  . Schizophrenia ([place]) 09/30/2015     Past Medical History:  Diagnosis Date  . Chronic fatigue   . Colon polyps   . Hyperlipemia   . Hypertension   . Hypothyroidism   . Schizophrenia ([place])   . Squamous cell carcinoma of right lung ([place]) 08/22/2014  . Wears  dentures    full upper and lower     Past Surgical History:  Procedure Laterality Date  . COLONOSCOPY     history of colon polyps  . COLONOSCOPY WITH PROPOFOL N/A 11/24/2016   Procedure: COLONOSCOPY WITH PROPOFOL;  Surgeon: Jonathon Bellows, MD;  Location: [facility] ENDOSCOPY;  Service: Endoscopy;  Laterality: N/A;  . ENDOBRONCHIAL ULTRASOUND  07/30/2014   right hilum FNA/EBUS  . ESOPHAGOGASTRODUODENOSCOPY (EGD) WITH PROPOFOL N/A 08/15/2016   Procedure: ESOPHAGOGASTRODUODENOSCOPY (EGD) WITH PROPOFOL;  Surgeon: [name], MD;  Location: [place];  Service: Endoscopy;  Laterality: N/A;  . PERIPHERAL VASCULAR CATHETERIZATION N/A 08/21/2014   Procedure: Glori Luis Cath Insertion;  Surgeon: Algernon Huxley, MD;  Location: [place] CV LAB;  Service: Cardiovascular;  Laterality: N/A;    Social History   Social History  . Marital status: Widowed    Spouse name: N/A  . Number of children: N/A  . Years of education: N/A   Occupational History  . Not on file.   Social History Main Topics  . Smoking status: Former Smoker    Packs/day: 0.25    Years: 42.00    Types: Cigarettes    Quit date: 2015  . Smokeless tobacco: Never Used  . Alcohol use No  . Drug use: No  . Sexual activity: Not on file   Other Topics Concern  . Not on file   Social History Narrative  . No narrative on file     No family history on file.   Current Facility-Administered Medications:  .  0.9 %  sodium chloride  infusion, , Intravenous, Continuous, Tukov, Magadalene S, NP, Last Rate: 125 mL/hr at 12/17/16 0000 .  acetaminophen (TYLENOL) tablet 650 mg, 650 mg, Oral, Q6H PRN **OR** acetaminophen (TYLENOL) suppository 650 mg, 650 mg, Rectal, Q6H PRN, Epifanio Lesches, MD .  bisacodyl (DULCOLAX) suppository 10 mg, 10 mg, Rectal, Daily PRN, Tukov, Magadalene S, NP .  chlorhexidine gluconate (MEDLINE KIT) (PERIDEX) 0.12 % solution 15 mL, 15 mL, Mouth Rinse, BID, Tukov, Magadalene S, NP, 15 mL at 12/17/16 0800 .   dextrose 50 % solution, , Intravenous, PRN, [name], MD, 1 ampule at 11/19/2016 1441 .  docusate sodium (COLACE) capsule 100 mg, 100 mg, Oral, BID, Epifanio Lesches, MD, 100 mg at 11/18/2016 2134 .  enoxaparin (LOVENOX) injection 40 mg, 40 mg, Subcutaneous, Q24H, Epifanio Lesches, MD, 40 mg at 12/09/2016 2134 .  EPINEPHrine (ADRENALIN) 1 MG/10ML injection, , Intravenous, PRN, [name], MD, 1 mg at 11/29/2016 1442 .  fentaNYL (SUBLIMAZE) bolus via infusion 50-100 mcg, 50-100 mcg, Intravenous, Q1H PRN, Tukov, Magadalene S, NP .  fentaNYL 2552mg in NS 2518m(1023mml) infusion-PREMIX, 25-400 mcg/hr, Intravenous, Continuous, Tukov, Magadalene S, NP, Last Rate: 5 mL/hr at 12/17/16 1206, 50 mcg/hr at 12/17/16 1206 .  ipratropium-albuterol (DUONEB) 0.5-2.5 (3) MG/3ML nebulizer solution 3 mL, 3 mL, Nebulization, Q6H, KonEpifanio LeschesD, 3 mL at 12/17/16 0758 .  MEDLINE mouth rinse, 15 mL, Mouth Rinse, QID, Tukov, Magadalene S, NP, 15 mL at 12/17/16 0400 .  methylPREDNISolone sodium succinate (SOLU-MEDROL) 125 mg/2 mL injection 60 mg, 60 mg, Intravenous, Q24H, Konidena, Snehalatha, MD .  midazolam (VERSED) injection 2-4 mg, 2-4 mg, Intravenous, Q1H PRN, Tukov, Magadalene S, NP .  midazolam (VERSED) injection 2-4 mg, 2-4 mg, Intravenous, Q2H PRN, Tukov, Magadalene S, NP, 2 mg at 12/17/16 0553 .  ondansetron (ZOFRAN) tablet 4 mg, 4 mg, Oral, Q6H PRN **OR** ondansetron (ZOFRAN) injection 4 mg, 4 mg, Intravenous, Q6H PRN, KonEpifanio LeschesD .  pantoprazole sodium (PROTONIX) 40 mg/20 mL oral suspension 40 mg, 40 mg, Per Tube, Q1200, AlvRigoberto NoelD .  piperacillin-tazobactam (ZOSYN) IVPB 3.375 g, 3.375 g, Intravenous, Q8H, KonEpifanio LeschesD, Stopped at 12/17/16 [id number] sennosides (SENOKOT) 8.8 MG/5ML syrup 5 mL, 5 mL, Per Tube, BID PRN, TukPatria Maneagadalene S, NP .  vancomycin (VANCOCIN) 1,250 mg in sodium chloride 0.9 % 250 mL IVPB, 1,250 mg, Intravenous, Q12H, KonEpifanio LeschesD, Stopped at 12/17/16 1113  Facility-Administered Medications Ordered in Other Encounters:  .  pegfilgrastim (NEULASTA ONPRO KIT) injection 6 mg, 6 mg, Subcutaneous, Once, BraCammie SickleD   Physical exam:  Vitals:   12/17/16 0900 12/17/16 1000 12/17/16 1100 12/17/16 1200  BP: 103/60 101/61 105/64 106/65  Pulse: (!) 122 (!) 119 (!) 114 (!) 111  Resp: (!) 24 (!) 24 (!) 25 14  Temp:    99.7 F (37.6 C)  TempSrc:    Oral  SpO2: 95% 96% 96% 95%  Weight:      Height:       Physical Exam   GENERAL: Intubated and sedated  SKIN:  No rashes or significant lesions  HEENT:non icteric LYMPH: No palpable cervical and axillary lymphadenopathy  LUNGS: no crackles or wheezes HEART: Regular rate & rhythm, no murmurs, ABDOMEN: Abdomen soft, non-tender, normal bowel sounds,  MUSCULOSKELETAL: exam deferred..  Marland KitchenXTREMITIES: No edema, no skin discoloration  NEURO: sedated  CMP Latest Ref Rng & Units 12/17/2016  Glucose 65 - 99 mg/dL 142(H)  BUN 6 - 20 mg/dL 18  Creatinine 0.61 -  1.24 mg/dL 1.21  Sodium 135 - 145 mmol/L 144  Potassium 3.5 - 5.1 mmol/L 3.2(L)  Chloride 101 - 111 mmol/L 107  CO2 22 - 32 mmol/L 27  Calcium 8.9 - 10.3 mg/dL 7.3(L)  Total Protein 6.5 - 8.1 g/dL -  Total Bilirubin 0.3 - 1.2 mg/dL -  Alkaline Phos 38 - 126 U/L -  AST 15 - 41 U/L -  ALT 17 - 63 U/L -   CBC Latest Ref Rng & Units 12/17/2016  WBC 3.8 - 10.6 K/uL 11.0(H)  Hemoglobin 13.0 - 18.0 g/dL 7.6(L)  Hematocrit 40.0 - 52.0 % 23.2(L)  Platelets 150 - 440 K/uL 374    @IMAGES @  Dg Abdomen 1 View  Result Date: 11/19/2016 CLINICAL DATA:  Gastric tube retracted.  Status post cardiac arrest. EXAM: ABDOMEN - 1 VIEW COMPARISON:  Earlier today. FINDINGS: Normal bowel gas pattern without the previously demonstrated gaseous distention of the stomach. There is a suggestion of the distal portion of an orogastric tube with its tip in the proximal to mid stomach. However, no orogastric tube can be  seen extending through the esophagus on the current image. Lumbar spine degenerative changes. IMPRESSION: There is a suggestion of the distal portion of an orogastric tube with its tip in the proximal to mid stomach. However, no orogastric tube is seen extending through the esophagus on the current image. A portable chest radiograph may provide useful additional information regarding the tube status. Electronically Signed   By: Claudie Revering M.D.   On: 12/08/2016 16:27   Dg Abdomen 1 View  Result Date: 11/19/2016 CLINICAL DATA:  OG tube placement, status post cardiac arrest EXAM: ABDOMEN - 1 VIEW COMPARISON:  None. FINDINGS: Gastric distention. Enteric tube terminates in the mid gastric body. IMPRESSION: Enteric tube terminates in the mid gastric body. Electronically Signed   By: Julian Hy M.D.   On: 11/20/2016 15:24   Ct Angio Chest Pe W Or Wo Contrast  Result Date: 12/17/2016 CLINICAL DATA:  Stage IV lung cancer getting chemotherapy at [place] had a cardiac arrest this afternoon CODE BLUE was called, patient received epinephrine, atropine, bicarbonate,. Patient had CPR for about 30 minutes and transferred to emergency room, intubated this time. Patient was unresponsive in the bathroom and found to be in asystole, patient received CPR and transferred to emergency room and subsequently developed second cardiac arrest and total down time 30 minutes of asystole with spontaneous circulation. In 30 minutes. Patient at this time currently intubated without any sedation. He is on full vent support EXAM: CT ANGIOGRAPHY CHEST WITH CONTRAST TECHNIQUE: Multidetector CT imaging of the chest was performed using the standard protocol during bolus administration of intravenous contrast. Multiplanar CT image reconstructions and MIPs were obtained to evaluate the vascular anatomy. CONTRAST:  75 mL Isovue 370 IV COMPARISON:  11/14/2016 FINDINGS: Cardiovascular: Left IJ port catheter extends to the distal SVC.  Intraluminal Filling defect associated with catheter tip suggesting adherent fibrin sheath/thrombus. SVC remains patent. Satisfactory opacification of pulmonary arteries noted, and there is no evidence of acute pulmonary emboli. Caliber changes in the right upper lobe branches of the pulmonary artery probably related to neoplasm as these were present on prior scan. Scattered coronary calcifications. Adequate contrast opacification of the thoracic aorta with no evidence of dissection, aneurysm, or stenosis. There is classic 3-vessel brachiocephalic arch anatomy without proximal stenosis. Mild calcified plaque in the aortic arch. Mediastinum/Nodes: No pericardial effusion. Stable 1 cm right hilar node. No new mediastinal adenopathy. Endotracheal tube  to the distal trachea. Nasogastric tube placement to the stomach. Lungs/Pleura: Persistent right suprahilar and contiguous apical masslike consolidation. Resolution of right pleural effusion since prior study. 1.1 cm and 1.9 cm left apical pulmonary nodules as before. New extensive patchy airspace opacities in the superior and posterior basal segments of both lower lobes with more dense basilar consolidation left greater than right. Upper Abdomen: No acute abnormality. Musculoskeletal: No chest wall abnormality. No acute or significant osseous findings. Review of the MIP images confirms the above findings. IMPRESSION: 1. Negative for acute PE or thoracic aortic dissection. 2. New extensive patchy airspace opacities in both lower lobes probably infectious/inflammatory. 3. Coronary and Aortic Atherosclerosis (ICD10-170.0). 4. Stable right apical/suprahilar mass and left apical pulmonary nodules. 5. Interval resolution of right pleural effusion 6. Fibrin sheath/thrombus at the tip of the port catheter with continued patency of the SVC. Electronically Signed   By: Lucrezia Europe M.D.   On: 12/17/2016 12:08   Dg Chest Port 1 View  Result Date: 12/17/2016 CLINICAL DATA:   Respiratory failure EXAM: PORTABLE CHEST 1 VIEW COMPARISON:  11/18/2016 FINDINGS: Endotracheal tube tip is about 3.2 cm superior to the carina. Esophageal tube tip is below the diaphragm. Left-sided central venous port tip overlies the SVC. Development of vascular congestion and interstitial and alveolar disease left greater than right. Stable cardiomediastinal silhouette. IMPRESSION: 1. Support lines and tubes as above 2. Development of vascular congestion and diffuse interstitial and alveolar opacity left greater than right which may reflect edema, although pneumonia may also produce this appearance Electronically Signed   By: Donavan Foil M.D.   On: 12/17/2016 03:58   Dg Chest Portable 1 View  Result Date: 11/19/2016 CLINICAL DATA:  Encounter for evaluation of ET tube placement. S/P cardiac arrest. EXAM: PORTABLE CHEST 1 VIEW COMPARISON:  04/23/2016 FINDINGS: Status post placement of endotracheal tube, tip 4.6 cm above the carina. Patient has a left-sided power port with tip overlying the level of superior vena cava. Nasogastric tube is in place, tip coiled back upon itself in the lower esophagus. Stomach is distended. Heart size is enlarged. There is right upper lobe opacity, better seen on recent chest CT but increased compared with earlier exams. IMPRESSION: 1. Interval placement of endotracheal tube and nasogastric tube. 2. Nasogastric tube tip is coiled in the stomach, tip in the lower esophagus. Gaseous distension of the stomach. Recommend repositioning and nasogastric tube. 3. Right upper lobe mass/density. These results will be called to the ordering clinician or representative by the Radiologist Assistant, and communication documented in the PACS or zVision Dashboard. Electronically Signed   By: Nolon Nations M.D.   On: 12/08/2016 15:32    Assessment and plan- Patient is a 61 y.o. male with Stage IV lung cancer who had anaphylactic reaction with cardiac arrest  # Cardiac arrest: currently  intubated. Continue ventilation support. ICU care per critical care team. Monitor neurological status  # Anaphylactic reaction; continue steroid.  # Stage IV lung cancer, previously treated with immunotherapy, currently on second cycle of 2nd line chemotherapy treatment with carboplatin and Taxol. He has not seen much of the chemotherapy options. If he can recover from this episode, potential other chemotherapy options can be utilized in outpatient setting. Patient's caregiver and mother at bedside. Discussed with them that patient is critically ill, prognosis is poor.   Thank you for this kind referral and the opportunity to participate in the care of this patient   Dr. Earlie Server, MD, PhD [facility] Pager- [phone] 12/17/2016

## 2016-12-17 NOTE — Progress Notes (Signed)
Pharmacy Antibiotic Note  Travis Maddox is a 61 y.o. male admitted on 11/17/2016 with sepsis.  Pharmacy has been consulted for vancomycin and Zosyn dosing.  Plan: Vancomycin 1250 mg  IV every 12 hours.  Goal trough 15-20 mcg/mL. Will obtain vancomycin trough prior to pm dose on 9/2. MRSA PCR is negative.  Continue Zosyn 3.375 g IV q8h EI  Please consider obtaining procalcitonin levels to help guide therapy.   Height: 6\' 2"  (188 cm) Weight: 210 lb 1.6 oz (95.3 kg) IBW/kg (Calculated) : 82.2  Temp (24hrs), Avg:98.6 F (37 C), Min:98.3 F (36.8 C), Max:99.2 F (37.3 C)   Recent Labs Lab 11/16/2016 0837 11/28/2016 1452 11/19/2016 1926 11/28/2016 2052 12/17/16 0132  WBC 5.0 4.8  --  12.3* 11.0*  CREATININE 0.96 1.12  --  1.13 1.21  LATICACIDVEN  --  12.5* 3.7*  --   --     Estimated Creatinine Clearance: 75.5 mL/min (by C-G formula based on SCr of 1.21 mg/dL).    Allergies  Allergen Reactions  . Carboplatin Anaphylaxis    Antimicrobials this admission: Vancomycin 8/31 >>  Zosyn 8/31 >>  Dose adjustments this admission: N/A  Microbiology results: 8/31 BCx: no growht < 24 hours  8/31 UCx: sent  8/31 MRSA PCR: negative   Thank you for allowing pharmacy to be a part of this patient's care.  Travis Maddox 12/17/2016 11:19 AM

## 2016-12-17 NOTE — Progress Notes (Signed)
[place] [facility] [place] NAME: Travis Maddox    MR#:  [id number]  DATE OF BIRTH:  Sep 08, 1955  SUBJECTIVE:   Patient intubated sedated on vent  REVIEW OF SYSTEMS:    Unable to obtain patient intubated sedated on vent   Tolerating Diet: npo      DRUG ALLERGIES:   Allergies  Allergen Reactions  . Carboplatin Anaphylaxis    VITALS:  Blood pressure 101/61, pulse (!) 119, temperature 99.2 F (37.3 C), temperature source Oral, resp. rate (!) 24, height 6\' 2"  (1.88 m), weight 95.3 kg (210 lb 1.6 oz), SpO2 96 %.  PHYSICAL EXAMINATION:  Constitutional: Appears well-developed and well-nourished. No distress. HENT: Normocephalic. . On vent  Eyes: Conjunctivae are normal. PERRLA, no scleral icterus.  Neck: Normal ROM. Neck supple. No JVD. No tracheal deviation. CVS: RRR, S1/S2 +, no murmurs, no gallops, no carotid bruit.  Pulmonary: Effort and breath sounds normal, no stridor, rhonchi, wheezes, rales.  Abdominal: Soft. BS +,  no distension, tenderness, rebound or guarding.  Musculoskeletal: No edema and no tenderness.  Neuro: Sedated on vent  Skin: Skin is warm and dry. No rash noted. Psychiatric: Sedated on vent    LABORATORY PANEL:   CBC  Recent Labs Lab 12/17/16 0132  WBC 11.0*  HGB 7.6*  HCT 23.2*  PLT 374   ------------------------------------------------------------------------------------------------------------------  Chemistries   Recent Labs Lab 12/07/2016 1452 12/08/2016 2052 12/17/16 0132  NA 145 144 144  K 3.7 4.3 3.2*  CL 108 106 107  CO2 21* 28 27  GLUCOSE 393* 226* 142*  BUN 9 15 18   CREATININE 1.12 1.13 1.21  CALCIUM 8.4* 7.7* 7.3*  MG 2.3 1.8  --   AST 65*  --   --   ALT 23  --   --   ALKPHOS 93  --   --   BILITOT 0.4  --   --    ------------------------------------------------------------------------------------------------------------------  Cardiac Enzymes  Recent Labs Lab 12/01/2016 1926  12/17/16 0132 12/17/16 0727  TROPONINI 0.05* 0.05* 0.04*   ------------------------------------------------------------------------------------------------------------------  RADIOLOGY:  Dg Abdomen 1 View  Result Date: 12/08/2016 CLINICAL DATA:  Gastric tube retracted.  Status post cardiac arrest. EXAM: ABDOMEN - 1 VIEW COMPARISON:  Earlier today. FINDINGS: Normal bowel gas pattern without the previously demonstrated gaseous distention of the stomach. There is a suggestion of the distal portion of an orogastric tube with its tip in the proximal to mid stomach. However, no orogastric tube can be seen extending through the esophagus on the current image. Lumbar spine degenerative changes. IMPRESSION: There is a suggestion of the distal portion of an orogastric tube with its tip in the proximal to mid stomach. However, no orogastric tube is seen extending through the esophagus on the current image. A portable chest radiograph may provide useful additional information regarding the tube status. Electronically Signed   By: Claudie Revering M.D.   On: 12/04/2016 16:27   Dg Abdomen 1 View  Result Date: 12/12/2016 CLINICAL DATA:  OG tube placement, status post cardiac arrest EXAM: ABDOMEN - 1 VIEW COMPARISON:  None. FINDINGS: Gastric distention. Enteric tube terminates in the mid gastric body. IMPRESSION: Enteric tube terminates in the mid gastric body. Electronically Signed   By: Julian Hy M.D.   On: 11/29/2016 15:24   Dg Chest Port 1 View  Result Date: 12/17/2016 CLINICAL DATA:  Respiratory failure EXAM: PORTABLE CHEST 1 VIEW COMPARISON:  12/08/2016 FINDINGS: Endotracheal tube tip is about 3.2 cm superior  to the carina. Esophageal tube tip is below the diaphragm. Left-sided central venous port tip overlies the SVC. Development of vascular congestion and interstitial and alveolar disease left greater than right. Stable cardiomediastinal silhouette. IMPRESSION: 1. Support lines and tubes as above 2.  Development of vascular congestion and diffuse interstitial and alveolar opacity left greater than right which may reflect edema, although pneumonia may also produce this appearance Electronically Signed   By: Donavan Foil M.D.   On: 12/17/2016 03:58   Dg Chest Portable 1 View  Result Date: 12/01/2016 CLINICAL DATA:  Encounter for evaluation of ET tube placement. S/P cardiac arrest. EXAM: PORTABLE CHEST 1 VIEW COMPARISON:  04/23/2016 FINDINGS: Status post placement of endotracheal tube, tip 4.6 cm above the carina. Patient has a left-sided power port with tip overlying the level of superior vena cava. Nasogastric tube is in place, tip coiled back upon itself in the lower esophagus. Stomach is distended. Heart size is enlarged. There is right upper lobe opacity, better seen on recent chest CT but increased compared with earlier exams. IMPRESSION: 1. Interval placement of endotracheal tube and nasogastric tube. 2. Nasogastric tube tip is coiled in the stomach, tip in the lower esophagus. Gaseous distension of the stomach. Recommend repositioning and nasogastric tube. 3. Right upper lobe mass/density. These results will be called to the ordering clinician or representative by the Radiologist Assistant, and communication documented in the PACS or zVision Dashboard. Electronically Signed   By: Nolon Nations M.D.   On: 11/24/2016 15:32     ASSESSMENT AND PLAN:   61 year old male with stage IV lung cancer who was found at cancer center unresponsive and in asystole and CODE BLUE was subsequently called.  1. Cardiac arrest: Patient was in asystole and CPR was performed for approximately 30 minutes The patient is intubated and sedated on vent. Continue current vent support Cardiac arrest may be from anaphylactic reaction to carboplatin.  2. Hypokalemia: Replete  3. Elevated troponin: Patient has not ruled out for ACS Needs ECHO  4. Hypotension requiring pressors due to cardiac arrest and shock  (cardiac)  5. Stage IV lung cancer: Oncology evaluation           CODE STATUS: full  TOTAL TIME TAKING CARE OF THIS PATIENT: 30 minutes.     POSSIBLE D/C ??, DEPENDING ON CLINICAL CONDITION.   Dalayza Zambrana M.D on 12/17/2016 at 10:06 AM  Between 7am to 6pm - Pager - [phone] After 6pm go to [email or web address] - password [email or web address] [facility]  Office  [phone]  CC: Primary care physician; [name], MD  Note: This dictation was prepared with Dragon dictation along with smaller phrase technology. Any transcriptional errors that result from this process are unintentional.

## 2016-12-17 DEATH — deceased

## 2016-12-18 ENCOUNTER — Inpatient Hospital Stay (HOSPITAL_COMMUNITY)
Admit: 2016-12-18 | Discharge: 2016-12-18 | Disposition: A | Discharge status: Home / Self Care | Payer: Medicare Other | Attending: Adult Health | Admitting: Adult Health

## 2016-12-18 ENCOUNTER — Inpatient Hospital Stay: Payer: Medicare Other

## 2016-12-18 DIAGNOSIS — I4891 Unspecified atrial fibrillation: Secondary | ICD-10-CM

## 2016-12-18 DIAGNOSIS — I4819 Other persistent atrial fibrillation: Secondary | ICD-10-CM

## 2016-12-18 DIAGNOSIS — I483 Typical atrial flutter: Secondary | ICD-10-CM

## 2016-12-18 DIAGNOSIS — C3490 Malignant neoplasm of unspecified part of unspecified bronchus or lung: Secondary | ICD-10-CM

## 2016-12-18 DIAGNOSIS — I480 Paroxysmal atrial fibrillation: Secondary | ICD-10-CM

## 2016-12-18 LAB — LACTIC ACID, PLASMA: Lactic Acid, Venous: 1.6 mmol/L (ref 0.5–1.9)

## 2016-12-18 LAB — BLOOD GAS, ARTERIAL
ACID-BASE DEFICIT: 0.5 mmol/L (ref 0.0–2.0)
Bicarbonate: 24.2 mmol/L (ref 20.0–28.0)
FIO2: 0.5
O2 SAT: 98.3 %
PATIENT TEMPERATURE: 37
PEEP: 10 cmH2O
PH ART: 7.4 (ref 7.350–7.450)
PO2 ART: 111 mmHg — AB (ref 83.0–108.0)
RATE: 20 resp/min
VT: 500 mL
pCO2 arterial: 39 mmHg (ref 32.0–48.0)

## 2016-12-18 LAB — BASIC METABOLIC PANEL
Anion gap: 6 (ref 5–15)
BUN: 32 mg/dL — ABNORMAL HIGH (ref 6–20)
CHLORIDE: 112 mmol/L — AB (ref 101–111)
CO2: 25 mmol/L (ref 22–32)
CREATININE: 1.39 mg/dL — AB (ref 0.61–1.24)
Calcium: 6.9 mg/dL — ABNORMAL LOW (ref 8.9–10.3)
GFR calc non Af Amer: 54 mL/min — ABNORMAL LOW (ref >60)
GLUCOSE: 152 mg/dL — AB (ref 65–99)
Potassium: 3.6 mmol/L (ref 3.5–5.1)
Sodium: 143 mmol/L (ref 135–145)

## 2016-12-18 LAB — ECHOCARDIOGRAM COMPLETE
HEIGHTINCHES: 74 in
WEIGHTICAEL: 3675.51 [oz_av]

## 2016-12-18 LAB — URINE CULTURE: CULTURE: NO GROWTH

## 2016-12-18 LAB — CBC
HEMATOCRIT: 21.2 % — AB (ref 40.0–52.0)
HEMOGLOBIN: 6.9 g/dL — AB (ref 13.0–18.0)
MCH: 26.9 pg (ref 26.0–34.0)
MCHC: 32.3 g/dL (ref 32.0–36.0)
MCV: 83.2 fL (ref 80.0–100.0)
Platelets: 220 10*3/uL (ref 150–440)
RBC: 2.55 MIL/uL — AB (ref 4.40–5.90)
RDW: 18.1 % — ABNORMAL HIGH (ref 11.5–14.5)
WBC: 5.3 10*3/uL (ref 3.8–10.6)

## 2016-12-18 LAB — TROPONIN I: Troponin I: <0.03 ng/mL (ref <0.03)

## 2016-12-18 LAB — GLUCOSE, CAPILLARY: Glucose-Capillary: 115 mg/dL — ABNORMAL HIGH (ref 65–99)

## 2016-12-18 LAB — HIV ANTIBODY (ROUTINE TESTING W REFLEX): HIV SCREEN 4TH GENERATION: NONREACTIVE

## 2016-12-18 LAB — MAGNESIUM: Magnesium: 2.4 mg/dL (ref 1.7–2.4)

## 2016-12-18 MED ORDER — VITAL AF 1.2 CAL PO LIQD: 1000.0000 mL | ORAL | Status: DC

## 2016-12-18 MED ORDER — GLYCOPYRROLATE 0.2 MG/ML IJ SOLN
0.4000 mg | INTRAMUSCULAR | Status: DC | PRN
  Administered 2016-12-18: 0.4 mg via INTRAVENOUS
  Filled 2016-12-18: qty 2

## 2016-12-18 MED ORDER — MORPHINE BOLUS VIA INFUSION
1.0000 mg | INTRAVENOUS | Status: DC | PRN
  Filled 2016-12-18: qty 1

## 2016-12-18 MED ORDER — DIGOXIN 0.25 MG/ML IJ SOLN
0.1250 mg | Freq: Once | INTRAMUSCULAR | Status: AC
  Administered 2016-12-18: 0.125 mg via INTRAVENOUS
  Filled 2016-12-18: qty 0.5

## 2016-12-18 MED ORDER — DILTIAZEM HCL 25 MG/5ML IV SOLN
10.0000 mg | Freq: Once | INTRAVENOUS | Status: AC
  Administered 2016-12-18: 10 mg via INTRAVENOUS
  Filled 2016-12-18: qty 5

## 2016-12-18 MED ORDER — VITAL HIGH PROTEIN PO LIQD: 1000.0000 mL | ORAL | Status: DC

## 2016-12-18 MED ORDER — ESMOLOL HCL-SODIUM CHLORIDE 2000 MG/100ML IV SOLN
25.0000 ug/kg/min | INTRAVENOUS | Status: DC
  Administered 2016-12-18: 25 ug/kg/min via INTRAVENOUS
  Filled 2016-12-18: qty 100

## 2016-12-18 MED ORDER — POTASSIUM CHLORIDE 10 MEQ/100ML IV SOLN
10.0000 meq | INTRAVENOUS | Status: DC
  Administered 2016-12-18 (×2): 10 meq via INTRAVENOUS
  Filled 2016-12-18 (×2): qty 100

## 2016-12-18 MED ORDER — LORAZEPAM 2 MG/ML IJ SOLN: 1.0000 mg | INTRAMUSCULAR | Status: DC | PRN

## 2016-12-18 MED ORDER — ESMOLOL BOLUS VIA INFUSION
500.0000 ug/kg | Freq: Once | INTRAVENOUS | Status: AC
  Administered 2016-12-18: 52100 ug via INTRAVENOUS
  Filled 2016-12-18: qty 53000

## 2016-12-18 MED ORDER — PANTOPRAZOLE SODIUM 40 MG IV SOLR
40.0000 mg | INTRAVENOUS | Status: DC
  Administered 2016-12-18: 40 mg via INTRAVENOUS
  Filled 2016-12-18: qty 40

## 2016-12-18 MED ORDER — ADENOSINE 12 MG/4ML IV SOLN
12.0000 mg | Freq: Once | INTRAVENOUS | Status: AC
  Administered 2016-12-18: 12 mg via INTRAVENOUS

## 2016-12-18 MED ORDER — SODIUM CHLORIDE 0.9 % IV SOLN
1.0000 mg/h | INTRAVENOUS | Status: DC
  Administered 2016-12-18: 5 mg/h via INTRAVENOUS
  Filled 2016-12-18: qty 10

## 2016-12-18 MED ORDER — FUROSEMIDE 10 MG/ML IJ SOLN
20.0000 mg | Freq: Once | INTRAMUSCULAR | Status: AC
  Administered 2016-12-18: 20 mg via INTRAVENOUS
  Filled 2016-12-18: qty 2

## 2016-12-21 ENCOUNTER — Telehealth: Payer: Self-pay

## 2016-12-21 LAB — CULTURE, BLOOD (ROUTINE X 2)
CULTURE: NO GROWTH
Culture: NO GROWTH
SPECIAL REQUESTS: ADEQUATE
Special Requests: ADEQUATE

## 2016-12-21 NOTE — Telephone Encounter (Signed)
Death cert placed in DK's folder. 

## 2016-12-21 NOTE — Telephone Encounter (Signed)
Received Death Certificate placed in lbpu box.

## 2016-12-22 MED ORDER — METHYLPREDNISOLONE SODIUM SUCC 125 MG IJ SOLR
125.0000 mg | INTRAMUSCULAR | Status: AC
  Administered 2016-12-16: 125 mg via INTRAVENOUS

## 2016-12-22 MED ORDER — DIPHENHYDRAMINE HCL 50 MG/ML IJ SOLN
25.0000 mg | INTRAMUSCULAR | Status: AC
  Administered 2016-12-16: 25 mg via INTRAVENOUS

## 2016-12-22 NOTE — Addendum Note (Signed)
Addended by: Sinclair Grooms D on: 12/22/2016 11:50 AM   Modules accepted: Orders

## 2016-12-22 NOTE — Telephone Encounter (Signed)
Informed Rich and [place] Silos that death cert is ready for pick up. Nothing further needed.

## 2016-12-26 ENCOUNTER — Ambulatory Visit: Payer: Medicare Other

## 2016-12-26 ENCOUNTER — Other Ambulatory Visit: Payer: Medicare Other

## 2017-01-06 ENCOUNTER — Ambulatory Visit: Payer: Medicare Other | Admitting: Internal Medicine

## 2017-01-06 ENCOUNTER — Other Ambulatory Visit: Payer: Medicare Other

## 2017-01-06 ENCOUNTER — Ambulatory Visit: Payer: Medicare Other

## 2017-01-16 NOTE — Progress Notes (Signed)
Initial Nutrition Assessment  DOCUMENTATION CODES:   Not applicable  INTERVENTION:  If patient expected to remain intubated greater than 24-48 hrs, recommend initiating adult tube feeding protocol. If patient is started on tube feeds, recommend goal regimen of Vital AF 1.2 at 65 ml/hr (1560 ml goal daily volume). Provides 1872 kcal, 117 grams of protein, 1264 ml H2O daily. Can initiate at trickle rate (10-20 ml/hr) and advance as tolerated.  Will monitor outcome of discussions regarding goals of care.  NUTRITION DIAGNOSIS:   Inadequate oral intake related to inability to eat as evidenced by NPO status.  GOAL:   Provide needs based on ASPEN/SCCM guidelines  MONITOR:   Vent status, Labs, Weight trends, TF tolerance, I & O's  REASON FOR ASSESSMENT:   Ventilator    ASSESSMENT:   61 year old male with PMHx if schizophrenia, HTN, HLD, hypothyroidism, stage IV squamous cell carcinoma of right lung who suffered anaphylactic reaction with cardiac arrest at cancer center on 8/31 after day 1 cycle 2 of Taxol and Carboplatin, was in asystole and underwent 30 minutes of CPR, was intubated.  -Patient had two episodes of cardiac arrest. -Pending PMT consult to discuss goals of care.  No family at bedside. Per chart patient is from group home. Current body weight significantly elevated from recent weights in chart. Patient was 193.4 lbs on 10/26/2016 and 200.2 lbs on 12/13/2016. Current body weight of 229.7 lbs likely falsely elevated in setting of edema. Will use recent weight of 200.2 lbs (90.8 kg) to estimate needs and will continue to monitor weight trend.  Access: OGT placed 8/31; per abdominal x-ray on 8/31 OGT with tip in proximal to mid stomach, but OGT cannot be seen extending through esophagus. OGT currently to LIS with thick brown output.  Patient is currently intubated on ventilator support MV: 9.3 L/min Temp (24hrs), Avg:99 F (37.2 C), Min:98.1 F (36.7 C), Max:100.2 F (37.9  C)  Propofol: N/A  Medications reviewed and include: Colace, methylprednisolone 60 mg Q24hrs, pantoprazole, NS @ 125 ml/hr, fentanyl gtt, Zosyn, potassium chloride 10 mEq two times today IV, vancomycin, esmolol gtt.  Labs reviewed: CBG 115, Chloride 112, BUN 32, Creatinine 1.39.  Nutrition-Focused physical exam completed. Findings are no fat depletion, no muscle depletion. Per RN edema assessment, patient has mild to moderate pitting edema. Noted on PMHx that patient wears full upper and lower dentures.  Discussed with RN. Patient did not require hypothermia protocol. Likely will not feed yet in setting of significant output from OGT.  Diet Order:  Diet NPO time specified  Skin:  Reviewed, no issues  Last BM:  Unknown  Height:   Ht Readings from Last 1 Encounters:  12/06/2016 6\' 2"  (1.88 m)    Weight:   Wt Readings from Last 1 Encounters:  2017-01-07 229 lb 11.5 oz (104.2 kg)    Ideal Body Weight:  86.4 kg  BMI:  Body mass index is 29.49 kg/m.  Estimated Nutritional Needs:   Kcal:  2126 (PSU 2003b w/ MSJ 1792, Ve 9.3, Tmax 37.9)  Protein:  109-127 grams (1.2-1.4 grams/kg)  Fluid:  2.3-2.7 L/day (25-30 ml/kg)  EDUCATION NEEDS:   No education needs identified at this time  Willey Blade, MS, RD, LDN Pager: [phone] After Hours Pager: [phone]

## 2017-01-16 NOTE — Progress Notes (Signed)
43 Spoke with pt's brother, Herbie [place] who relayed to me that the family is ready to transition patient to comfort care. Family members have been notified and are ready. Family has spoken with Dr. Lyndel Safe about their wishes.  1450 Pt has been transitioned to comfort care and has been placed on a morphine drip. Family at bedside. Mable Fill has been paged.

## 2017-01-16 NOTE — Death Summary Note (Signed)
DEATH SUMMARY   Patient Details  Name: Travis Maddox MRN: [id number] DOB: Apr 13, 1956  Admission/Discharge Information   Admit Date:  01/05/17  Date of Death: Date of Death: Jan 07, 2017  Time of Death: Time of Death: 07/16/2000  Length of Stay: 2  Referring Physician: [name], MD   Reason(s) for Hospitalization  cardiac arrest secondary to anaphylaxis, New onset unstable SVT  Diagnoses  Preliminary cause of death: MOF secondary to cardiopulmonary arrest and anaphylactic shock Secondary Diagnoses (including complications and co-morbidities):  Active Problems:   Cardiac arrest (HCC)   Acute respiratory failure with hypoxia (HCC)   Anaphylactic syndrome   Acute respiratory failure (HCC)   Persistent atrial fibrillation (HCC)   Stage IV squamous cell carcinoma of lung [facility])   Brief Hospital Course (including significant findings, care, treatment, and services provided and events leading to death)  Travis Maddox is a 61 y.o. year old male who was admitted from the cancer center following a cardiopulmonary arrest 2/2 anaphylaxis from Carboplatin. He was resuscitated with ROSC and intubated and transferred to the ED. He was admitted to the ICU. He continued to be in shock requiring pressors and aggressive fluid resuscitation. Last night he developed SVT and subsequently dropped his blood pressure. Despite multiple antiarrhythmics, his HR remained in the 140s. His family decided to make him comfort care and he expired at 70: 02    Pertinent Labs and Studies  Significant Diagnostic Studies Dg Abdomen 1 View  Result Date: 2017/01/05 CLINICAL DATA:  Gastric tube retracted.  Status post cardiac arrest. EXAM: ABDOMEN - 1 VIEW COMPARISON:  Earlier today. FINDINGS: Normal bowel gas pattern without the previously demonstrated gaseous distention of the stomach. There is a suggestion of the distal portion of an orogastric tube with its tip in the proximal to mid stomach. However, no orogastric  tube can be seen extending through the esophagus on the current image. Lumbar spine degenerative changes. IMPRESSION: There is a suggestion of the distal portion of an orogastric tube with its tip in the proximal to mid stomach. However, no orogastric tube is seen extending through the esophagus on the current image. A portable chest radiograph may provide useful additional information regarding the tube status. Electronically Signed   By: Claudie Revering M.D.   On: 2017-01-05 16:27   Dg Abdomen 1 View  Result Date: 05-Jan-2017 CLINICAL DATA:  OG tube placement, status post cardiac arrest EXAM: ABDOMEN - 1 VIEW COMPARISON:  None. FINDINGS: Gastric distention. Enteric tube terminates in the mid gastric body. IMPRESSION: Enteric tube terminates in the mid gastric body. Electronically Signed   By: Julian Hy M.D.   On: 01-05-2017 15:24   Ct Angio Chest Pe W Or Wo Contrast  Result Date: 12/17/2016 CLINICAL DATA:  Stage IV lung cancer getting chemotherapy at [facility] had a cardiac arrest this afternoon CODE BLUE was called, patient received epinephrine, atropine, bicarbonate,. Patient had CPR for about 30 minutes and transferred to emergency room, intubated this time. Patient was unresponsive in the bathroom and found to be in asystole, patient received CPR and transferred to emergency room and subsequently developed second cardiac arrest and total down time 30 minutes of asystole with spontaneous circulation. In 30 minutes. Patient at this time currently intubated without any sedation. He is on full vent support EXAM: CT ANGIOGRAPHY CHEST WITH CONTRAST TECHNIQUE: Multidetector CT imaging of the chest was performed using the standard protocol during bolus administration of intravenous contrast. Multiplanar CT image reconstructions and MIPs were obtained  to evaluate the vascular anatomy. CONTRAST:  75 mL Isovue 370 IV COMPARISON:  11/14/2016 FINDINGS: Cardiovascular: Left IJ port catheter extends to the  distal SVC. Intraluminal Filling defect associated with catheter tip suggesting adherent fibrin sheath/thrombus. SVC remains patent. Satisfactory opacification of pulmonary arteries noted, and there is no evidence of acute pulmonary emboli. Caliber changes in the right upper lobe branches of the pulmonary artery probably related to neoplasm as these were present on prior scan. Scattered coronary calcifications. Adequate contrast opacification of the thoracic aorta with no evidence of dissection, aneurysm, or stenosis. There is classic 3-vessel brachiocephalic arch anatomy without proximal stenosis. Mild calcified plaque in the aortic arch. Mediastinum/Nodes: No pericardial effusion. Stable 1 cm right hilar node. No new mediastinal adenopathy. Endotracheal tube to the distal trachea. Nasogastric tube placement to the stomach. Lungs/Pleura: Persistent right suprahilar and contiguous apical masslike consolidation. Resolution of right pleural effusion since prior study. 1.1 cm and 1.9 cm left apical pulmonary nodules as before. New extensive patchy airspace opacities in the superior and posterior basal segments of both lower lobes with more dense basilar consolidation left greater than right. Upper Abdomen: No acute abnormality. Musculoskeletal: No chest wall abnormality. No acute or significant osseous findings. Review of the MIP images confirms the above findings. IMPRESSION: 1. Negative for acute PE or thoracic aortic dissection. 2. New extensive patchy airspace opacities in both lower lobes probably infectious/inflammatory. 3. Coronary and Aortic Atherosclerosis (ICD10-170.0). 4. Stable right apical/suprahilar mass and left apical pulmonary nodules. 5. Interval resolution of right pleural effusion 6. Fibrin sheath/thrombus at the tip of the port catheter with continued patency of the SVC. Electronically Signed   By: Lucrezia Europe M.D.   On: 12/17/2016 12:08   Dg Chest Port 1 View  Result Date: January 01, 2017 CLINICAL  DATA:  61 y/o  M; sudden onset arrhythmia. EXAM: PORTABLE CHEST 1 VIEW COMPARISON:  12/17/2016 chest radiograph FINDINGS: Stable cardiac silhouette given projection and technique. Endotracheal tube tip 4.8 cm from carina. Enteric tube tip below the field of view in the abdomen. Left central venous catheter tip projects over mid SVC. Interval increase in reticular markings the lungs likely represents interval development of mild interstitial pulmonary edema. Patchy opacities at lung bases may represent atelectasis or infiltrates. No acute osseous abnormality identified. IMPRESSION: Increased reticular markings within the lungs compatible with interstitial pulmonary edema. Stable minor bibasilar atelectasis and/or pneumonia. Stable lines and tubes. Electronically Signed   By: Kristine Garbe M.D.   On: January 01, 2017 00:57   Dg Chest Port 1 View  Result Date: 12/17/2016 CLINICAL DATA:  Respiratory failure EXAM: PORTABLE CHEST 1 VIEW COMPARISON:  11/30/2016 FINDINGS: Endotracheal tube tip is about 3.2 cm superior to the carina. Esophageal tube tip is below the diaphragm. Left-sided central venous port tip overlies the SVC. Development of vascular congestion and interstitial and alveolar disease left greater than right. Stable cardiomediastinal silhouette. IMPRESSION: 1. Support lines and tubes as above 2. Development of vascular congestion and diffuse interstitial and alveolar opacity left greater than right which may reflect edema, although pneumonia may also produce this appearance Electronically Signed   By: Donavan Foil M.D.   On: 12/17/2016 03:58   Dg Chest Portable 1 View  Result Date: 11/30/2016 CLINICAL DATA:  Encounter for evaluation of ET tube placement. S/P cardiac arrest. EXAM: PORTABLE CHEST 1 VIEW COMPARISON:  04/23/2016 FINDINGS: Status post placement of endotracheal tube, tip 4.6 cm above the carina. Patient has a left-sided power port with tip overlying the level of superior  vena cava.  Nasogastric tube is in place, tip coiled back upon itself in the lower esophagus. Stomach is distended. Heart size is enlarged. There is right upper lobe opacity, better seen on recent chest CT but increased compared with earlier exams. IMPRESSION: 1. Interval placement of endotracheal tube and nasogastric tube. 2. Nasogastric tube tip is coiled in the stomach, tip in the lower esophagus. Gaseous distension of the stomach. Recommend repositioning and nasogastric tube. 3. Right upper lobe mass/density. These results will be called to the ordering clinician or representative by the Radiologist Assistant, and communication documented in the PACS or zVision Dashboard. Electronically Signed   By: Nolon Nations M.D.   On: 11/29/2016 15:32    Microbiology Recent Results (from the past 240 hour(s))  Urine culture     Status: None   Collection Time: 11/21/2016  2:53 PM  Result Value Ref Range Status   Specimen Description URINE, RANDOM  Final   Special Requests NONE  Final   Culture   Final    NO GROWTH Performed at [facility], [address] [place], [place] [address]    Report Status 10-Jan-2017 FINAL  Final  Blood culture (routine x 2)     Status: None (Preliminary result)   Collection Time: 11/29/2016  2:53 PM  Result Value Ref Range Status   Specimen Description BLOOD BLOOD RIGHT FOREARM  Final   Special Requests   Final    BOTTLES DRAWN AEROBIC AND ANAEROBIC Blood Culture adequate volume   Culture NO GROWTH 2 DAYS  Final   Report Status PENDING  Incomplete  Blood culture (routine x 2)     Status: None (Preliminary result)   Collection Time: 12/14/2016  2:58 PM  Result Value Ref Range Status   Specimen Description BLOOD RIGHT ANTECUBITAL  Final   Special Requests   Final    BOTTLES DRAWN AEROBIC AND ANAEROBIC Blood Culture adequate volume   Culture NO GROWTH 2 DAYS  Final   Report Status PENDING  Incomplete  MRSA PCR Screening     Status: None   Collection Time: 12/09/2016  8:01 PM   Result Value Ref Range Status   MRSA by PCR NEGATIVE NEGATIVE Final    Comment:        The GeneXpert MRSA Assay (FDA approved for NASAL specimens only), is one component of a comprehensive MRSA colonization surveillance program. It is not intended to diagnose MRSA infection nor to guide or monitor treatment for MRSA infections.     Lab Basic Metabolic Panel:  Recent Labs Lab 12/01/2016 1452 11/27/2016 2052 12/17/16 0132 12/17/16 0727 12/17/16 1911 12/17/16 2125 10-Jan-2017 0301  NA 145 144 144  --  143  --  143  K 3.7 4.3 3.2*  --  4.3 4.0 3.6  CL 108 106 107  --  110  --  112*  CO2 21* 28 27  --  25  --  25  GLUCOSE 393* 226* 142*  --  136*  --  152*  BUN 9 15 18   --  33*  --  32*  CREATININE 1.12 1.13 1.21  --  1.51*  --  1.39*  CALCIUM 8.4* 7.7* 7.3*  --  6.7*  --  6.9*  MG 2.3 1.8  --  1.5* 2.4 2.2 2.4  PHOS  --  3.8  --  4.0  --   --   --    Liver Function Tests:  Recent Labs Lab 11/27/2016 0837 11/28/2016 1452  AST  33 65*  ALT 16* 23  ALKPHOS 84 93  BILITOT 0.3 0.4  PROT 6.9 6.0*  ALBUMIN 3.2* 2.6*   No results for input(s): LIPASE, AMYLASE in the last 168 hours. No results for input(s): AMMONIA in the last 168 hours. CBC:  Recent Labs Lab 11/19/2016 0837 12/04/2016 1452 12/06/2016 2052 12/17/16 0132 12/17/16 2125 Jan 16, 2017 1024  WBC 5.0 4.8 12.3* 11.0* 7.6 5.3  NEUTROABS 3.4  --   --   --   --   --   HGB 8.2* 8.0* 8.5* 7.6* 7.1* 6.9*  HCT 24.5* 27.2* 26.7* 23.2* 21.4* 21.2*  MCV 82.8 93.5 84.3 83.6 82.7 83.2  PLT 472* 491* 500* 374 258 220   Cardiac Enzymes:  Recent Labs Lab 12/17/16 0132 12/17/16 0727 12/17/16 2125 16-Jan-2017 0301 01/16/17 0926  TROPONINI 0.05* 0.04* <0.03 <0.03 <0.03   Sepsis Labs:  Recent Labs Lab 12/06/2016 1926 12/02/2016 2052 12/17/16 0132 12/17/16 1401 12/17/16 2125 01-16-2017 1024  WBC  --  12.3* 11.0*  --  7.6 5.3  LATICACIDVEN 3.7*  --   --  2.0* 3.4* 1.6    Procedures/Operations  None  [facility]  ANP-BC Pulmonary and [place] Pager [phone] or [phone]   Mary Sella 01-16-2017, 8:16 PM

## 2017-01-16 NOTE — Progress Notes (Signed)
In transferring to bed, patient became apneic. Ushered family back into room. Patient passed shortly thereafter. Two RN witness of death with this RN and Domingo Pulse RN. Notified 1C RN Silva Bandy. Followed though with appropriate post-mortem care.   Patient had dentures left at bedside, sent dentures with patient in body bag to morgue. Called brother Machael Raine, left voicemail that dentures were sent with patient.

## 2017-01-16 NOTE — Progress Notes (Signed)
    Hematology/Oncology Progress Note [facility]  [phone] [phone]  Patient Care Team: [name], MD as PCP - General (Internal Medicine)   Name of the patient: Diane Mochizuki  [id number]  1955-07-28   Date of visit: 01/03/2017  Interval history-  Overnight, developed A fib with RVR, was given amio gtt. Patient's family family members decides to pursue comfort care.  Patient was extubated this afternoon. He is on comfort care.   Review of systems- ROS  On comfort care. Deferred.   Current treatment- comfort care.   Allergies  Allergen Reactions  . Carboplatin Anaphylaxis    History reviewed. No pertinent family history.  Current Facility-Administered Medications:  .  glycopyrrolate (ROBINUL) injection 0.4 mg, 0.4 mg, Intravenous, Q4H PRN, Dimas Chyle, MD .  LORazepam (ATIVAN) injection 1 mg, 1 mg, Intravenous, Q1H PRN, Dimas Chyle, MD .  morphine 250 mg in sodium chloride 0.9 % 250 mL (1 mg/mL) infusion, 1-10 mg/hr, Intravenous, Continuous, Dimas Chyle, MD, Last Rate: 5 mL/hr at 01/03/2017 1430, 5 mg/hr at 01/03/2017 1430 .  morphine bolus via infusion 1 mg, 1 mg, Intravenous, Q30 min PRN, Dimas Chyle, MD .  ondansetron [facility]) tablet 4 mg, 4 mg, Oral, Q6H PRN **OR** ondansetron (ZOFRAN) injection 4 mg, 4 mg, Intravenous, Q6H PRN, Epifanio Lesches, MD .  sennosides (SENOKOT) 8.8 MG/5ML syrup 5 mL, 5 mL, Per Tube, BID PRN, Patria Mane, Magadalene S, NP  Facility-Administered Medications Ordered in Other Encounters:  .  pegfilgrastim (NEULASTA ONPRO KIT) injection 6 mg, 6 mg, Subcutaneous, Once, [name], MD  Physical exam:  Vitals:   01/03/2017 1230 01-03-17 1300 January 03, 2017 1330 2017/01/03 1400  BP: (!) 98/56 (!) 97/55 102/68 (!) 102/55  Pulse: 83 79 81 81  Resp: 12 11 (!) 9 15  Temp:      TempSrc:      SpO2: 100% 100% 100% 100%  Weight:      Height:       Physical Exam   Deferred as patient is on comfort care.    CMP Latest Ref Rng & Units 01/03/17  Glucose 65 - 99 mg/dL 152(H)  BUN 6 - 20 mg/dL 32(H)  Creatinine 0.61 - 1.24 mg/dL 1.39(H)  Sodium 135 - 145 mmol/L 143  Potassium 3.5 - 5.1 mmol/L 3.6  Chloride 101 - 111 mmol/L 112(H)  CO2 22 - 32 mmol/L 25  Calcium 8.9 - 10.3 mg/dL 6.9(L)  Total Protein 6.5 - 8.1 g/dL -  Total Bilirubin 0.3 - 1.2 mg/dL -  Alkaline Phos 38 - 126 U/L -  AST 15 - 41 U/L -  ALT 17 - 63 U/L -   CBC Latest Ref Rng & Units 01/03/17  WBC 3.8 - 10.6 K/uL 5.3  Hemoglobin 13.0 - 18.0 g/dL 6.9(L)  Hematocrit 40.0 - 52.0 % 21.2(L)  Platelets 150 - 440 K/uL 220      Assessment and plan- Patient is a 61 y.o. male with Stage IV lung cancer who had anaphylactic reaction with cardiac arrest/respiratory failure/arrythmia.  Patient is on comfort care, appears comfortable. Patient's care giver is at bedside. Spirit support was given.   Dr. Earlie Server, MD, PhD [facility] Pager- [phone] 2017-01-03 3:10 PM

## 2017-01-16 NOTE — Progress Notes (Signed)
Patient is being transferred to bed 118. Called and gave telephone report to [facility]. Patient resting in bed, family at bedside. Notified family of transfer to new room.

## 2017-01-16 NOTE — Progress Notes (Signed)
Patient is extubated and placed on 2 lpm O2 [place]

## 2017-01-16 NOTE — Consult Note (Signed)
Cardiology Consultation:   Patient ID: Travis Maddox; [id number]; 1956/01/25   Admit date: 11/30/2016 Date of Consult: 12/19/16  Primary Care Provider: [name], MD Primary Cardiologist: None Primary Electrophysiologist:  None   Patient Profile:   Travis Maddox is a 61 y.o. male with a hx of stage 4 lung cancer who is being seen today for the evaluation of arrhythmia at the request of Dr Benjie Karvonen.  History of Present Illness:   Travis Maddox 61 y.o. with stage 4 squamous lung cancer. No history available as patient is intubated. Apparently was getting chemotherapy and had anaphylactic reaction to chemo Asystolic with two codes both 30 minutes to ROSC. No previous history of cardiac issues Appears to be in atrial tachycardia or flutter. No preceding chest pain syncope or palpitations Remains intubated with stable hemodynamics.   Past Medical History:  Diagnosis Date  . Chronic fatigue   . Colon polyps   . Hyperlipemia   . Hypertension   . Hypothyroidism   . Schizophrenia ([place])   . Squamous cell carcinoma of right lung ([place]) 08/22/2014  . Wears dentures    full upper and lower    Past Surgical History:  Procedure Laterality Date  . COLONOSCOPY     history of colon polyps  . COLONOSCOPY WITH PROPOFOL N/A 11/24/2016   Procedure: COLONOSCOPY WITH PROPOFOL;  Surgeon: Jonathon Bellows, MD;  Location: [facility] ENDOSCOPY;  Service: Endoscopy;  Laterality: N/A;  . ENDOBRONCHIAL ULTRASOUND  07/30/2014   right hilum FNA/EBUS  . ESOPHAGOGASTRODUODENOSCOPY (EGD) WITH PROPOFOL N/A 08/15/2016   Procedure: ESOPHAGOGASTRODUODENOSCOPY (EGD) WITH PROPOFOL;  Surgeon: [name], MD;  Location: [place];  Service: Endoscopy;  Laterality: N/A;  . PERIPHERAL VASCULAR CATHETERIZATION N/A 08/21/2014   Procedure: Glori Luis Cath Insertion;  Surgeon: Algernon Huxley, MD;  Location: [place] CV LAB;  Service: Cardiovascular;  Laterality: N/A;     Home Medications:  Prior to Admission medications     Medication Sig Start Date End Date Taking? Authorizing Provider  atorvastatin (LIPITOR) 10 MG tablet Take 10 mg by mouth every morning.    Yes [provider]  calcium carbonate (TUMS - DOSED IN MG ELEMENTAL CALCIUM) 500 MG chewable tablet Chew 1 tablet by mouth 2 (two) times daily.   Yes [provider]  ferrous sulfate 325 (65 FE) MG tablet Take 325 mg by mouth daily with breakfast.   Yes [provider]  levothyroxine (SYNTHROID, LEVOTHROID) 88 MCG tablet Take 88 mcg by mouth every morning.    Yes [provider]  lisinopril (PRINIVIL,ZESTRIL) 10 MG tablet Take 10 mg by mouth daily.   Yes [provider]  OLANZapine (ZYPREXA) 20 MG tablet Take 20 mg by mouth at bedtime.    Yes [provider]  pantoprazole (PROTONIX) 40 MG tablet Take 1 tablet (40 mg total) by mouth daily. 08/15/16  Yes [name], MD  prochlorperazine (COMPAZINE) 10 MG tablet TAKE 1 TABLET BY MOUTH EVERY 6 HOURS AS NEEDED FOR NAUSEA & VOMITING 11/02/16  Yes [name], MD  propranolol (INDERAL) 10 MG tablet Take 10 mg by mouth 3 (three) times daily.   Yes [provider]  sucralfate (CARAFATE) 1 g tablet Take 1 g by mouth 3 (three) times daily.    Yes [provider]  ondansetron (ZOFRAN) 8 MG tablet TAKE 1 TABLET EVERY 8 HOURS AS NEEDED FOR NAUSEA AND VOMITING. [phone] DAYS AFTER CHEMO) Patient not taking: Reported on 12/03/2016 11/28/16   [name], MD  predniSONE (DELTASONE) 20 MG tablet 2 pills a day with food x 14 days; and then once a day with food. Patient not taking: Reported on 12/05/2016 12/06/2016   [name], MD    Inpatient Medications: Scheduled Meds: . chlorhexidine gluconate (MEDLINE KIT)  15 mL Mouth Rinse BID  . docusate sodium  100 mg Oral BID  . enoxaparin (LOVENOX) injection  40 mg Subcutaneous Q24H  . ipratropium  0.5 mg Nebulization Q6H  . levalbuterol  0.63 mg Nebulization Q6H  . mouth rinse  15  mL Mouth Rinse QID  . methylPREDNISolone (SOLU-MEDROL) injection  60 mg Intravenous Q24H  . pantoprazole (PROTONIX) IV  40 mg Intravenous Q24H   Continuous Infusions: . sodium chloride 125 mL/hr at 12/22/2016 0719  . amiodarone 60 mg/hr (12-22-2016 8250)  . fentaNYL infusion INTRAVENOUS 75 mcg/hr (Dec 22, 2016 0916)  . piperacillin-tazobactam (ZOSYN)  IV 3.375 g (December 22, 2016 0543)  . potassium chloride    . sodium chloride    . vancomycin 1,250 mg (2016/12/22 0915)   PRN Meds: acetaminophen **OR** acetaminophen, bisacodyl, dextrose, EPINEPHrine, fentaNYL, midazolam, midazolam, ondansetron **OR** ondansetron (ZOFRAN) IV, sennosides  Allergies:    Allergies  Allergen Reactions  . Carboplatin Anaphylaxis    Social History:   Social History   Social History  . Marital status: Widowed    Spouse name: N/A  . Number of children: N/A  . Years of education: N/A   Occupational History  . Not on file.   Social History Main Topics  . Smoking status: Former Smoker    Packs/day: 0.25    Years: 42.00    Types: Cigarettes    Quit date: 2015  . Smokeless tobacco: Never Used  . Alcohol use No  . Drug use: No  . Sexual activity: Not on file   Other Topics Concern  . Not on file   Social History Narrative  . No narrative on file    Family History:   History reviewed. No pertinent family history.   ROS:  Please see the history of present illness.  ROS  All other ROS reviewed and negative.     Physical Exam/Data:   Vitals:   12-22-2016 0700 12-22-2016 0800 12-22-2016 0842 12-22-16 0900  BP: (!) 88/56 99/61  (!) 93/53  Pulse: 69 74  73  Resp: 15 14  16   Temp:  98.1 F (36.7 C)    TempSrc:  Oral    SpO2: 98% 96% 96% 96%  Weight:      Height:        Intake/Output Summary (Last 24 hours) at 22-Dec-2016 1010 Last data filed at 12-22-16 0800  Gross per 24 hour  Intake          4574.61 ml  Output             1275 ml  Net          3299.61 ml   Filed Weights   11/21/2016 2001 12/17/16 0441  Dec 22, 2016 0412  Weight: 210 lb 1.6 oz (95.3 kg) 210 lb 1.6 oz (95.3 kg) 229 lb 11.5 oz (104.2 kg)   Body mass index is 29.49 kg/m.  General:  Chronically ill white male intubated  HEENT: normal Lymph: no adenopathy Neck: no JVD Endocrine:  No thryomegaly Vascular: No carotid bruits; FA pulses 2+ bilaterally without bruits  Cardiac:  normal S1, S2; RRR; no murmur   Lungs:  clear to auscultation bilaterally, no wheezing, rhonchi or rales  Abd: soft, nontender, no hepatomegaly  Ext:  no edema Musculoskeletal:  No deformities, BUE and BLE strength normal and equal Skin: warm and dry  Neuro:  CNs 2-12 intact, no focal abnormalities noted Psych:  Normal affect   EKG:  The EKG was personally reviewed and demonstrates:  Afib/flutter rate 146 Telemetry:  Telemetry was personally reviewed and demonstrates:  Narrow complex tachycardia 150  Relevant CV Studies: None  Laboratory Data:  Chemistry Recent Labs Lab 12/17/16 0132 12/17/16 1911 12/17/16 2125 12/22/2016 0301  NA 144 143  --  143  K 3.2* 4.3 4.0 3.6  CL 107 110  --  112*  CO2 27 25  --  25  GLUCOSE 142* 136*  --  152*  BUN 18 33*  --  32*  CREATININE 1.21 1.51*  --  1.39*  CALCIUM 7.3* 6.7*  --  6.9*  GFRNONAA >60 49*  --  54*  GFRAA >60 56*  --  >60  ANIONGAP 10 8  --  6     Recent Labs Lab 11/25/2016 0837 12/02/2016 1452  PROT 6.9 6.0*  ALBUMIN 3.2* 2.6*  AST 33 65*  ALT 16* 23  ALKPHOS 84 93  BILITOT 0.3 0.4   Hematology Recent Labs Lab 11/18/2016 2052 12/17/16 0132 12/17/16 2125  WBC 12.3* 11.0* 7.6  RBC 3.16* 2.78* 2.59*  HGB 8.5* 7.6* 7.1*  HCT 26.7* 23.2* 21.4*  MCV 84.3 83.6 82.7  MCH 26.8 27.4 27.3  MCHC 31.8* 32.8 33.0  RDW 16.8* 17.0* 17.9*  PLT 500* 374 258   Cardiac Enzymes Recent Labs Lab 12/01/2016 1926 12/17/16 0132 12/17/16 0727 12/17/16 2125 12-22-16 0301 12-22-2016 0926  TROPONINI 0.05* 0.05* 0.04* <0.03 <0.03 <0.03   No results for input(s): TROPIPOC in the last 168 hours.   BNP Recent Labs Lab 11/18/2016 1452  BNP 337.0*    DDimer No results for input(s): DDIMER in the last 168 hours.  Radiology/Studies:  Dg Abdomen 1 View  Result Date: 12/15/2016 CLINICAL DATA:  Gastric tube retracted.  Status post cardiac arrest. EXAM: ABDOMEN - 1 VIEW COMPARISON:  Earlier today. FINDINGS: Normal bowel gas pattern without the previously demonstrated gaseous distention of the stomach. There is a suggestion of the distal portion of an orogastric tube with its tip in the proximal to mid stomach. However, no orogastric tube can be seen extending through the esophagus on the current image. Lumbar spine degenerative changes. IMPRESSION: There is a suggestion of the distal portion of an orogastric tube with its tip in the proximal to mid stomach. However, no orogastric tube is seen extending through the esophagus on the current image. A portable chest radiograph may provide useful additional information regarding the tube status. Electronically Signed   By: Claudie Revering M.D.   On: 12/07/2016 16:27   Dg Abdomen 1 View  Result Date: 11/29/2016 CLINICAL DATA:  OG tube placement, status post cardiac arrest EXAM: ABDOMEN - 1 VIEW COMPARISON:  None. FINDINGS: Gastric distention. Enteric tube terminates in the mid gastric body. IMPRESSION: Enteric tube terminates in the mid gastric body. Electronically Signed   By: Julian Hy M.D.   On: 12/03/2016 15:24   Ct Angio Chest Pe W Or Wo Contrast  Result Date: 12/17/2016 CLINICAL DATA:  Stage IV lung cancer getting chemotherapy at [facility] had a cardiac arrest this afternoon CODE BLUE was called, patient received epinephrine, atropine, bicarbonate,. Patient had CPR for about 30 minutes and transferred to emergency room, intubated this time. Patient was unresponsive in the bathroom and found to be in asystole,  patient received CPR and transferred to emergency room and subsequently developed second cardiac arrest and total down time 30 minutes  of asystole with spontaneous circulation. In 30 minutes. Patient at this time currently intubated without any sedation. He is on full vent support EXAM: CT ANGIOGRAPHY CHEST WITH CONTRAST TECHNIQUE: Multidetector CT imaging of the chest was performed using the standard protocol during bolus administration of intravenous contrast. Multiplanar CT image reconstructions and MIPs were obtained to evaluate the vascular anatomy. CONTRAST:  75 mL Isovue 370 IV COMPARISON:  11/14/2016 FINDINGS: Cardiovascular: Left IJ port catheter extends to the distal SVC. Intraluminal Filling defect associated with catheter tip suggesting adherent fibrin sheath/thrombus. SVC remains patent. Satisfactory opacification of pulmonary arteries noted, and there is no evidence of acute pulmonary emboli. Caliber changes in the right upper lobe branches of the pulmonary artery probably related to neoplasm as these were present on prior scan. Scattered coronary calcifications. Adequate contrast opacification of the thoracic aorta with no evidence of dissection, aneurysm, or stenosis. There is classic 3-vessel brachiocephalic arch anatomy without proximal stenosis. Mild calcified plaque in the aortic arch. Mediastinum/Nodes: No pericardial effusion. Stable 1 cm right hilar node. No new mediastinal adenopathy. Endotracheal tube to the distal trachea. Nasogastric tube placement to the stomach. Lungs/Pleura: Persistent right suprahilar and contiguous apical masslike consolidation. Resolution of right pleural effusion since prior study. 1.1 cm and 1.9 cm left apical pulmonary nodules as before. New extensive patchy airspace opacities in the superior and posterior basal segments of both lower lobes with more dense basilar consolidation left greater than right. Upper Abdomen: No acute abnormality. Musculoskeletal: No chest wall abnormality. No acute or significant osseous findings. Review of the MIP images confirms the above findings. IMPRESSION: 1.  Negative for acute PE or thoracic aortic dissection. 2. New extensive patchy airspace opacities in both lower lobes probably infectious/inflammatory. 3. Coronary and Aortic Atherosclerosis (ICD10-170.0). 4. Stable right apical/suprahilar mass and left apical pulmonary nodules. 5. Interval resolution of right pleural effusion 6. Fibrin sheath/thrombus at the tip of the port catheter with continued patency of the SVC. Electronically Signed   By: Lucrezia Europe M.D.   On: 12/17/2016 12:08   Dg Chest Port 1 View  Result Date: 01-05-17 CLINICAL DATA:  61 y/o  M; sudden onset arrhythmia. EXAM: PORTABLE CHEST 1 VIEW COMPARISON:  12/17/2016 chest radiograph FINDINGS: Stable cardiac silhouette given projection and technique. Endotracheal tube tip 4.8 cm from carina. Enteric tube tip below the field of view in the abdomen. Left central venous catheter tip projects over mid SVC. Interval increase in reticular markings the lungs likely represents interval development of mild interstitial pulmonary edema. Patchy opacities at lung bases may represent atelectasis or infiltrates. No acute osseous abnormality identified. IMPRESSION: Increased reticular markings within the lungs compatible with interstitial pulmonary edema. Stable minor bibasilar atelectasis and/or pneumonia. Stable lines and tubes. Electronically Signed   By: Kristine Garbe M.D.   On: 01/05/2017 00:57   Dg Chest Port 1 View  Result Date: 12/17/2016 CLINICAL DATA:  Respiratory failure EXAM: PORTABLE CHEST 1 VIEW COMPARISON:  11/22/2016 FINDINGS: Endotracheal tube tip is about 3.2 cm superior to the carina. Esophageal tube tip is below the diaphragm. Left-sided central venous port tip overlies the SVC. Development of vascular congestion and interstitial and alveolar disease left greater than right. Stable cardiomediastinal silhouette. IMPRESSION: 1. Support lines and tubes as above 2. Development of vascular congestion and diffuse interstitial and  alveolar opacity left greater than right which may reflect edema, although pneumonia  may also produce this appearance Electronically Signed   By: Donavan Foil M.D.   On: 12/17/2016 03:58   Dg Chest Portable 1 View  Result Date: 11/22/2016 CLINICAL DATA:  Encounter for evaluation of ET tube placement. S/P cardiac arrest. EXAM: PORTABLE CHEST 1 VIEW COMPARISON:  04/23/2016 FINDINGS: Status post placement of endotracheal tube, tip 4.6 cm above the carina. Patient has a left-sided power port with tip overlying the level of superior vena cava. Nasogastric tube is in place, tip coiled back upon itself in the lower esophagus. Stomach is distended. Heart size is enlarged. There is right upper lobe opacity, better seen on recent chest CT but increased compared with earlier exams. IMPRESSION: 1. Interval placement of endotracheal tube and nasogastric tube. 2. Nasogastric tube tip is coiled in the stomach, tip in the lower esophagus. Gaseous distension of the stomach. Recommend repositioning and nasogastric tube. 3. Right upper lobe mass/density. These results will be called to the ordering clinician or representative by the Radiologist Assistant, and communication documented in the PACS or zVision Dashboard. Electronically Signed   By: Nolon Nations M.D.   On: 11/27/2016 15:32    Assessment and Plan:   Arrhythmia:  Appears to have atrial fibrillation/flutter I gave 12 mg adenosine at bedside And confirmed atrial flutter. Echo limited due to vent and rapid rate but looks ok wit no  Bad valves normal RV size and likely normal EF. Will add esmolol for rate control continue Amiodarone. Will need consideration for anticoagulation if persists.  Overall prognosis from stage 4 lung cancer and ROSC 30 minutes x 2 is poor  Signed, Jenkins Rouge, MD  12-Jan-2017 10:10 AM

## 2017-01-16 NOTE — Progress Notes (Signed)
Pharmacy Antibiotic Note  Travis Maddox is a 61 y.o. male admitted on 12/12/2016 with sepsis.  Pharmacy has been consulted for vancomycin and Zosyn dosing.  Plan: Vancomycin 1250 mg  IV every 12 hours.  Goal trough 15-20 mcg/mL. Will obtain vancomycin trough prior to pm dose on 9/2 (before 5th dose). MRSA PCR is negative. Bump in SCr overnight but trending back down - will need to closely monitor.   Continue Zosyn 3.375 g IV q8h EI  Please consider obtaining procalcitonin levels to help guide therapy.   Height: 6\' 2"  (188 cm) Weight: 229 lb 11.5 oz (104.2 kg) IBW/kg (Calculated) : 82.2  Temp (24hrs), Avg:99.1 F (37.3 C), Min:98.1 F (36.7 C), Max:100.2 F (37.9 C)   Recent Labs Lab 12/10/2016 0837 11/20/2016 1452 11/19/2016 1926 11/17/2016 2052 12/17/16 0132 12/17/16 1401 12/17/16 1911 12/17/16 2125 Jan 09, 2017 0301  WBC 5.0 4.8  --  12.3* 11.0*  --   --  7.6  --   CREATININE 0.96 1.12  --  1.13 1.21  --  1.51*  --  1.39*  LATICACIDVEN  --  12.5* 3.7*  --   --  2.0*  --  3.4*  --     Estimated Creatinine Clearance: 72.7 mL/min (A) (by C-G formula based on SCr of 1.39 mg/dL (H)).    Allergies  Allergen Reactions  . Carboplatin Anaphylaxis    Antimicrobials this admission: Vancomycin 8/31 >>  Zosyn 8/31 >>  Dose adjustments this admission: N/A  Microbiology results: 8/31 BCx: no growth 2 days  8/31 UCx: sent  8/31 MRSA PCR: negative   Thank you for allowing pharmacy to be a part of this patient's care.  Rocky Morel 01/09/2017 8:59 AM

## 2017-01-16 NOTE — Consult Note (Signed)
[place] for electrolytes Indication: hypokalemia, hypomagnesium  Allergies  Allergen Reactions  . Carboplatin Anaphylaxis    Patient Measurements: Height: 6\' 2"  (188 cm) Weight: 229 lb 11.5 oz (104.2 kg) IBW/kg (Calculated) : 82.2 Adjusted Body Weight:   Vital Signs: Temp: 98.1 F (36.7 C) (09/02 0800) Temp Source: Oral (09/02 0800) BP: 99/61 (09/02 0800) Pulse Rate: 74 (09/02 0800) Intake/Output from previous day: 09/01 0701 - 09/02 0700 In: 4775.7 [I.V.:3825.7; IV Piggyback:950] Out: 9563 [Urine:1275] Intake/Output from this shift: Total I/O In: 375 [I.V.:375] Out: -   Labs:  Recent Labs  11/29/2016 0837  11/18/2016 1452 11/18/2016 2052 12/17/16 0132 12/17/16 0727 12/17/16 1911 12/17/16 2125 01-10-2017 0301  WBC 5.0  --  4.8 12.3* 11.0*  --   --  7.6  --   HGB 8.2*  --  8.0* 8.5* 7.6*  --   --  7.1*  --   HCT 24.5*  --  27.2* 26.7* 23.2*  --   --  21.4*  --   PLT 472*  --  491* 500* 374  --   --  258  --   APTT  --   --  48*  --   --   --   --  44*  --   CREATININE 0.96  --  1.12 1.13 1.21  --  1.51*  --  1.39*  MG  --   < > 2.3 1.8  --  1.5* 2.4 2.2 2.4  PHOS  --   --   --  3.8  --  4.0  --   --   --   ALBUMIN 3.2*  --  2.6*  --   --   --   --   --   --   PROT 6.9  --  6.0*  --   --   --   --   --   --   AST 33  --  65*  --   --   --   --   --   --   ALT 16*  --  23  --   --   --   --   --   --   ALKPHOS 84  --  93  --   --   --   --   --   --   BILITOT 0.3  --  0.4  --   --   --   --   --   --   < > = values in this interval not displayed. Estimated Creatinine Clearance: 72.7 mL/min (A) (by C-G formula based on SCr of 1.39 mg/dL (H)).   Assessment: Patient is a 61 year old male who is admitted s/p cardiac arrest. Pt currently intubated. Pharmacy consulted to replace electroyles.  K 3.6, Mag 2.4, notes about AFib with RVR, one dose of lasix given 9/2  Goal of Therapy:  K= 4-5 Mg=2-2.4 Phos=2.5-4.6  Plan:  KCL  IV 10 MEQ x 2 - for goal K ~4 as pt in AFib. Recheck electrolytes in AM  Rayna Sexton, PharmD, BCPS Clinical Pharmacist 01/10/2017 8:53 AM

## 2017-01-16 NOTE — Progress Notes (Signed)
Brief Note Family have decided to pursue comfort care measures. Will sign off. Please call with questions.   Dimas Chyle MD

## 2017-01-16 NOTE — Progress Notes (Signed)
Chaplain received a PG due to patient being on comfort care. Provided ministry of spiritual presence and words of comfort to patient, family, and friend. Offered silent prayer.     2017-01-17 1450  Clinical Encounter Type  Visited With Patient;Patient and family together;Health care provider  Visit Type Initial;Spiritual support;Patient actively dying  Referral From Nurse  Consult/Referral To Chaplain  Spiritual Encounters  Spiritual Needs Emotional

## 2017-01-16 NOTE — Progress Notes (Signed)
[facility] [place] NAME: Travis Maddox    MR#:  [id number]  DATE OF BIRTH:  January 25, 1956  SUBJECTIVE:   Patient intubated sedated on vent Sinus tachy started on amio gtt   REVIEW OF SYSTEMS:    Unable to obtain patient intubated sedated on vent   Tolerating Diet: npo      DRUG ALLERGIES:   Allergies  Allergen Reactions  . Carboplatin Anaphylaxis    VITALS:  Blood pressure (!) 93/53, pulse 73, temperature 98.1 F (36.7 C), temperature source Oral, resp. rate 16, height 6\' 2"  (1.88 m), weight 104.2 kg (229 lb 11.5 oz), SpO2 96 %.  PHYSICAL EXAMINATION:  Constitutional: Appears well-developed and well-nourished. No distress. HENT: Normocephalic. . On vent  Eyes: Conjunctivae are normal. PERRLA, no scleral icterus.  Neck: Normal ROM. Neck supple. No JVD. No tracheal deviation. CVS: RRR, S1/S2 +, no murmurs, no gallops, no carotid bruit.  Pulmonary: Effort and breath sounds normal, no stridor, rhonchi, wheezes, rales.  Abdominal: Soft. BS +,  no distension, tenderness, rebound or guarding.  Musculoskeletal: 1+ edema and no tenderness.  Neuro: Sedated on vent  Skin: Skin is warm and dry. No rash noted. Psychiatric: Sedated on vent    LABORATORY PANEL:   CBC  Recent Labs Lab 12/17/16 2125  WBC 7.6  HGB 7.1*  HCT 21.4*  PLT 258   ------------------------------------------------------------------------------------------------------------------  Chemistries   Recent Labs Lab 12/11/2016 1452  28-Dec-2016 0301  NA 145  < > 143  K 3.7  < > 3.6  CL 108  < > 112*  CO2 21*  < > 25  GLUCOSE 393*  < > 152*  BUN 9  < > 32*  CREATININE 1.12  < > 1.39*  CALCIUM 8.4*  < > 6.9*  MG 2.3  < > 2.4  AST 65*  --   --   ALT 23  --   --   ALKPHOS 93  --   --   BILITOT 0.4  --   --   < > = values in this interval not  displayed. ------------------------------------------------------------------------------------------------------------------  Cardiac Enzymes  Recent Labs Lab 12/17/16 2125 28-Dec-2016 0301 12-28-2016 0926  TROPONINI <0.03 <0.03 <0.03   ------------------------------------------------------------------------------------------------------------------  RADIOLOGY:  Dg Abdomen 1 View  Result Date: 12/02/2016 CLINICAL DATA:  Gastric tube retracted.  Status post cardiac arrest. EXAM: ABDOMEN - 1 VIEW COMPARISON:  Earlier today. FINDINGS: Normal bowel gas pattern without the previously demonstrated gaseous distention of the stomach. There is a suggestion of the distal portion of an orogastric tube with its tip in the proximal to mid stomach. However, no orogastric tube can be seen extending through the esophagus on the current image. Lumbar spine degenerative changes. IMPRESSION: There is a suggestion of the distal portion of an orogastric tube with its tip in the proximal to mid stomach. However, no orogastric tube is seen extending through the esophagus on the current image. A portable chest radiograph may provide useful additional information regarding the tube status. Electronically Signed   By: Claudie Revering M.D.   On: 11/30/2016 16:27   Dg Abdomen 1 View  Result Date: 11/18/2016 CLINICAL DATA:  OG tube placement, status post cardiac arrest EXAM: ABDOMEN - 1 VIEW COMPARISON:  None. FINDINGS: Gastric distention. Enteric tube terminates in the mid gastric body. IMPRESSION: Enteric tube terminates in the mid gastric body. Electronically Signed   By: Julian Hy M.D.   On: 11/17/2016 15:24   Ct Angio  Chest Pe W Or Wo Contrast  Result Date: 12/17/2016 CLINICAL DATA:  Stage IV lung cancer getting chemotherapy at [facility] had a cardiac arrest this afternoon CODE BLUE was called, patient received epinephrine, atropine, bicarbonate,. Patient had CPR for about 30 minutes and transferred to  emergency room, intubated this time. Patient was unresponsive in the bathroom and found to be in asystole, patient received CPR and transferred to emergency room and subsequently developed second cardiac arrest and total down time 30 minutes of asystole with spontaneous circulation. In 30 minutes. Patient at this time currently intubated without any sedation. He is on full vent support EXAM: CT ANGIOGRAPHY CHEST WITH CONTRAST TECHNIQUE: Multidetector CT imaging of the chest was performed using the standard protocol during bolus administration of intravenous contrast. Multiplanar CT image reconstructions and MIPs were obtained to evaluate the vascular anatomy. CONTRAST:  75 mL Isovue 370 IV COMPARISON:  11/14/2016 FINDINGS: Cardiovascular: Left IJ port catheter extends to the distal SVC. Intraluminal Filling defect associated with catheter tip suggesting adherent fibrin sheath/thrombus. SVC remains patent. Satisfactory opacification of pulmonary arteries noted, and there is no evidence of acute pulmonary emboli. Caliber changes in the right upper lobe branches of the pulmonary artery probably related to neoplasm as these were present on prior scan. Scattered coronary calcifications. Adequate contrast opacification of the thoracic aorta with no evidence of dissection, aneurysm, or stenosis. There is classic 3-vessel brachiocephalic arch anatomy without proximal stenosis. Mild calcified plaque in the aortic arch. Mediastinum/Nodes: No pericardial effusion. Stable 1 cm right hilar node. No new mediastinal adenopathy. Endotracheal tube to the distal trachea. Nasogastric tube placement to the stomach. Lungs/Pleura: Persistent right suprahilar and contiguous apical masslike consolidation. Resolution of right pleural effusion since prior study. 1.1 cm and 1.9 cm left apical pulmonary nodules as before. New extensive patchy airspace opacities in the superior and posterior basal segments of both lower lobes with more dense  basilar consolidation left greater than right. Upper Abdomen: No acute abnormality. Musculoskeletal: No chest wall abnormality. No acute or significant osseous findings. Review of the MIP images confirms the above findings. IMPRESSION: 1. Negative for acute PE or thoracic aortic dissection. 2. New extensive patchy airspace opacities in both lower lobes probably infectious/inflammatory. 3. Coronary and Aortic Atherosclerosis (ICD10-170.0). 4. Stable right apical/suprahilar mass and left apical pulmonary nodules. 5. Interval resolution of right pleural effusion 6. Fibrin sheath/thrombus at the tip of the port catheter with continued patency of the SVC. Electronically Signed   By: Lucrezia Europe M.D.   On: 12/17/2016 12:08   Dg Chest Port 1 View  Result Date: 2017/01/12 CLINICAL DATA:  61 y/o  M; sudden onset arrhythmia. EXAM: PORTABLE CHEST 1 VIEW COMPARISON:  12/17/2016 chest radiograph FINDINGS: Stable cardiac silhouette given projection and technique. Endotracheal tube tip 4.8 cm from carina. Enteric tube tip below the field of view in the abdomen. Left central venous catheter tip projects over mid SVC. Interval increase in reticular markings the lungs likely represents interval development of mild interstitial pulmonary edema. Patchy opacities at lung bases may represent atelectasis or infiltrates. No acute osseous abnormality identified. IMPRESSION: Increased reticular markings within the lungs compatible with interstitial pulmonary edema. Stable minor bibasilar atelectasis and/or pneumonia. Stable lines and tubes. Electronically Signed   By: Kristine Garbe M.D.   On: 01-12-17 00:57   Dg Chest Port 1 View  Result Date: 12/17/2016 CLINICAL DATA:  Respiratory failure EXAM: PORTABLE CHEST 1 VIEW COMPARISON:  12/15/2016 FINDINGS: Endotracheal tube tip is about 3.2 cm superior  to the carina. Esophageal tube tip is below the diaphragm. Left-sided central venous port tip overlies the SVC. Development of  vascular congestion and interstitial and alveolar disease left greater than right. Stable cardiomediastinal silhouette. IMPRESSION: 1. Support lines and tubes as above 2. Development of vascular congestion and diffuse interstitial and alveolar opacity left greater than right which may reflect edema, although pneumonia may also produce this appearance Electronically Signed   By: Donavan Foil M.D.   On: 12/17/2016 03:58   Dg Chest Portable 1 View  Result Date: 11/22/2016 CLINICAL DATA:  Encounter for evaluation of ET tube placement. S/P cardiac arrest. EXAM: PORTABLE CHEST 1 VIEW COMPARISON:  04/23/2016 FINDINGS: Status post placement of endotracheal tube, tip 4.6 cm above the carina. Patient has a left-sided power port with tip overlying the level of superior vena cava. Nasogastric tube is in place, tip coiled back upon itself in the lower esophagus. Stomach is distended. Heart size is enlarged. There is right upper lobe opacity, better seen on recent chest CT but increased compared with earlier exams. IMPRESSION: 1. Interval placement of endotracheal tube and nasogastric tube. 2. Nasogastric tube tip is coiled in the stomach, tip in the lower esophagus. Gaseous distension of the stomach. Recommend repositioning and nasogastric tube. 3. Right upper lobe mass/density. These results will be called to the ordering clinician or representative by the Radiologist Assistant, and communication documented in the PACS or zVision Dashboard. Electronically Signed   By: Nolon Nations M.D.   On: 11/17/2016 15:32     ASSESSMENT AND PLAN:   61 year old male with stage IV lung cancer who was found at cancer center unresponsive and in asystole and CODE BLUE was subsequently called.  1. Cardiac arrest: Patient was in asystole and CPR was performed for approximately 30 minutes The patient is intubated and sedated on vent. Continue current vent support Cardiac arrest may be from anaphylactic reaction to  carboplatin. Continue steroids for Anaphylactic reaction 2. Hypokalemia: Repleted  3. Elevated troponin: Patient has not ruled out for ACS ECHO pending  4. Hypotension requiring pressors due to cardiac arrest and shock (anaphylactic)  5. Stage IV lung cancer: Oncology evaluation appreciated  6. Acute on Chronic anemia: Transfuse if Hgb less then 7. CBC pending this am          CODE STATUS: full  TOTAL TIME TAKING CARE OF THIS PATIENT: 28 minutes.     POSSIBLE D/C ??, DEPENDING ON CLINICAL CONDITION.   Arwilda Georgia M.D on 01/06/17 at 10:03 AM  Between 7am to 6pm - Pager - [phone] After 6pm go to [email or web address] - password [email or web address] [facility]  Office  [phone]  CC: Primary care physician; [name], MD  Note: This dictation was prepared with Dragon dictation along with smaller phrase technology. Any transcriptional errors that result from this process are unintentional.

## 2017-01-16 DEATH — deceased

## 2017-01-23 ENCOUNTER — Other Ambulatory Visit: Payer: Self-pay | Admitting: Nurse Practitioner

## 2018-03-11 IMAGING — CT CT ANGIO CHEST
2 of 6 series · 17 of 46 positions shown · IV contrast (APPLIED)
Comparison: 11/14/2016

CLINICAL DATA: Stage IV lung cancer getting chemotherapy at [[facility]] had a cardiac arrest this afternoon CODE BLUE was called,
patient received epinephrine, atropine, bicarbonate,. Patient had
CPR for about 30 minutes and transferred to emergency room,
intubated this time. Patient was unresponsive in the bathroom and
found to be in asystole, patient received CPR and transferred to
emergency room and subsequently developed second cardiac arrest and
total down time 30 minutes of asystole with spontaneous circulation.
In 30 minutes. Patient at this time currently intubated without any
sedation. He is on full vent support

EXAM:
CT ANGIOGRAPHY CHEST WITH CONTRAST
TECHNIQUE: Multidetector CT imaging of the chest was performed using the
standard protocol during bolus administration of intravenous
contrast. Multiplanar CT image reconstructions and MIPs were
obtained to evaluate the vascular anatomy.
CONTRAST:  75 mL Isovue 370 IV

[Series 6: thins · axial · 0.74mm/px · z∈[-893,-620]mm · 15 of 301 slices shown]
[im 14/301  lung]
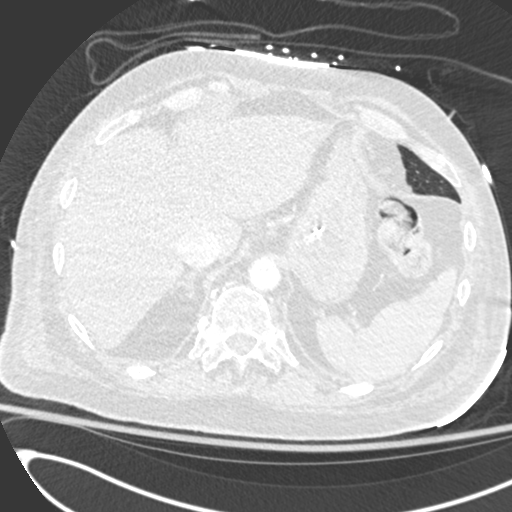
[im 40/301  soft-tissue]
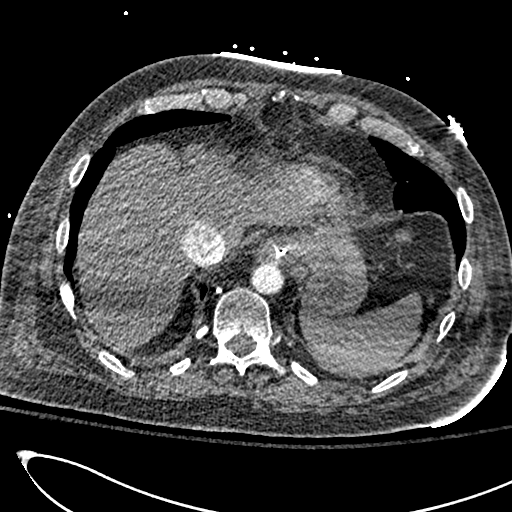
[im 53/301  lung]
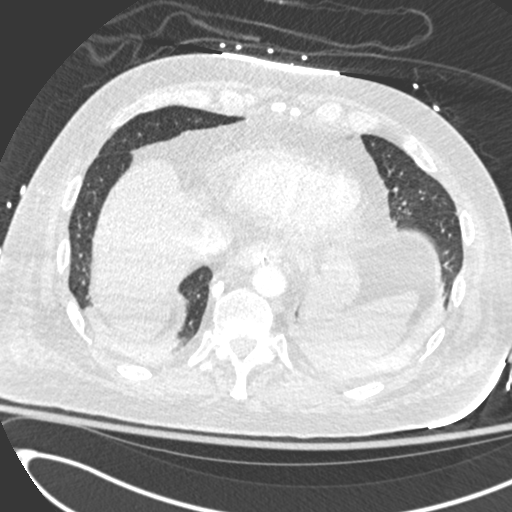
[im 79/301  soft-tissue]
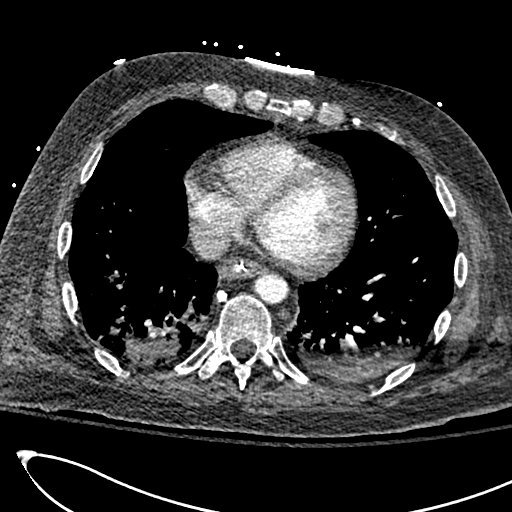
[im 92/301  lung]
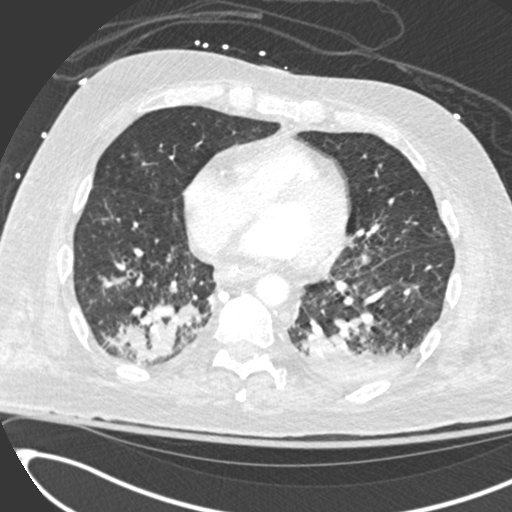
[im 118/301  soft-tissue]
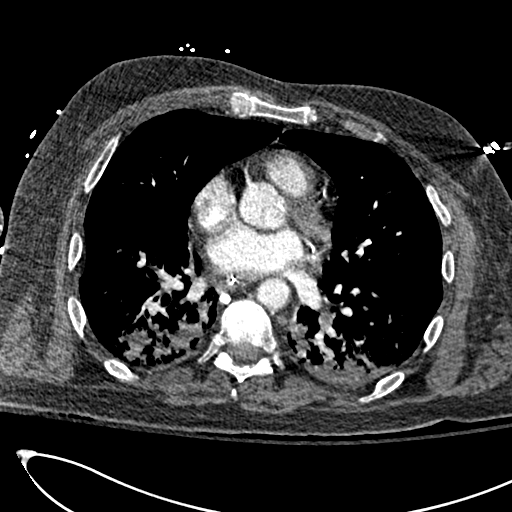
[im 131/301  lung]
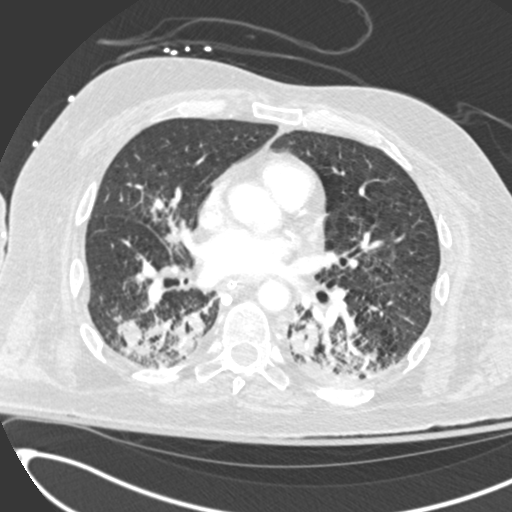
[im 157/301  soft-tissue]
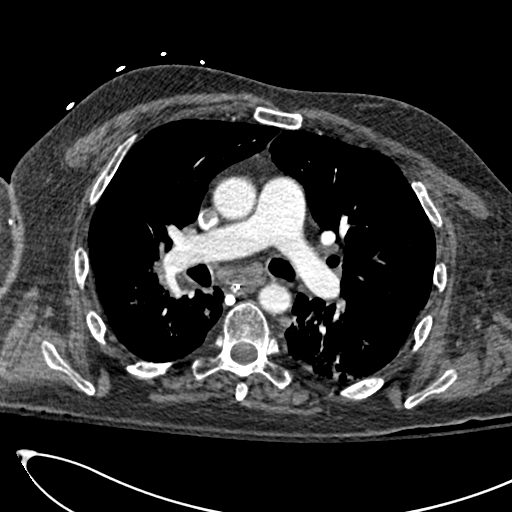
[im 170/301  lung]
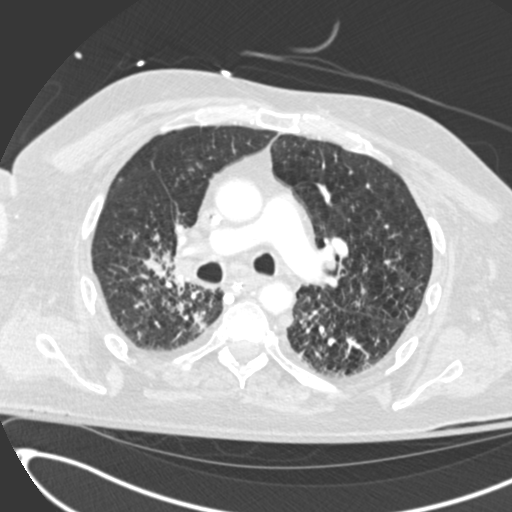
[im 183/301  soft-tissue]
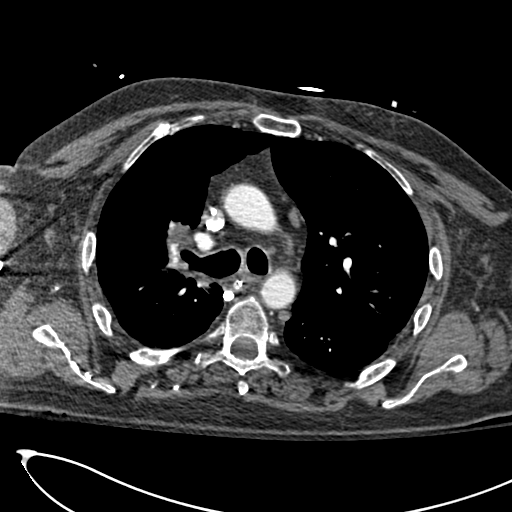
[im 209/301  lung]
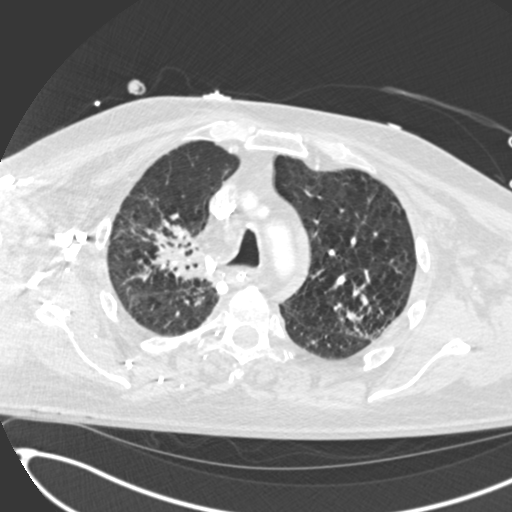
[im 222/301  soft-tissue]
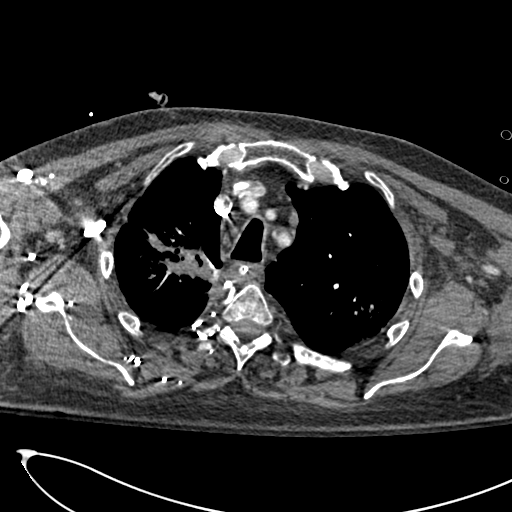
[im 248/301  lung]
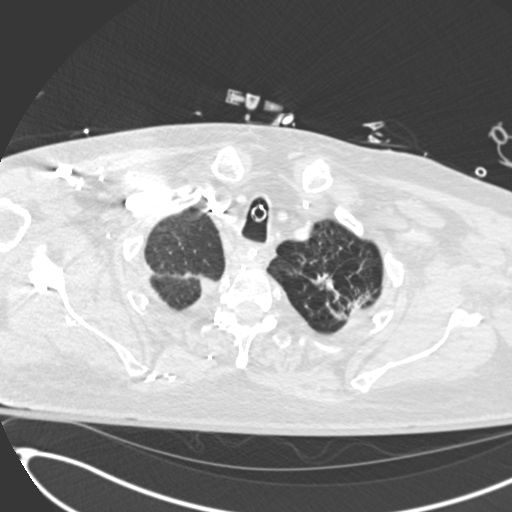
[im 261/301  soft-tissue]
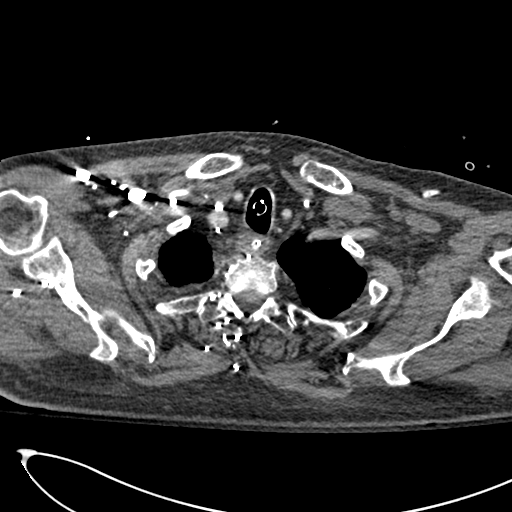
[im 287/301  lung]
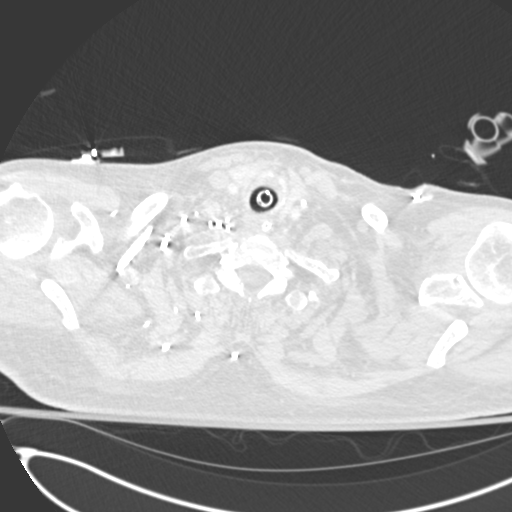

[Series 8: coronal mpr · coronal · 0.61mm/px · 2 of 88 slices shown]
[im 30/88  soft-tissue]
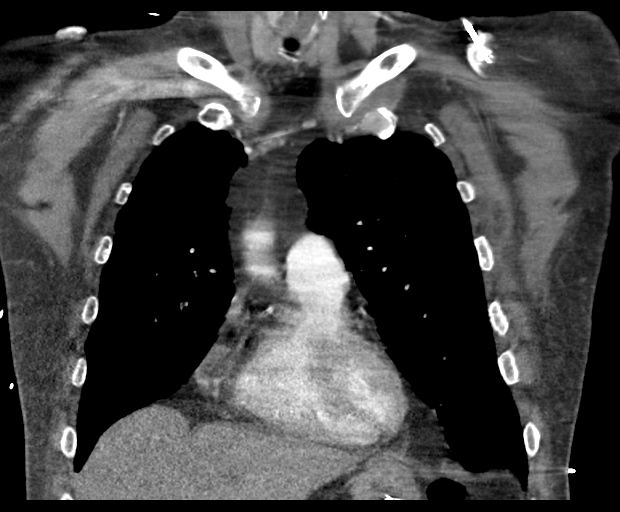
[im 59/88  soft-tissue]
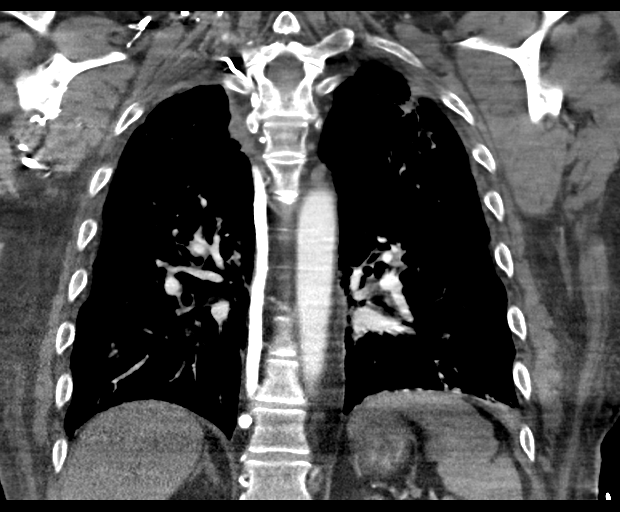

[17 of 46 positions shown; findings below may reference images not displayed]

FINDINGS: Cardiovascular: Left IJ port catheter extends to the distal SVC.
Intraluminal Filling defect associated with catheter tip suggesting
adherent fibrin sheath/thrombus. SVC remains patent.

Satisfactory opacification of pulmonary arteries noted, and there is
no evidence of acute pulmonary emboli. Caliber changes in the right
upper lobe branches of the pulmonary artery probably related to
neoplasm as these were present on prior scan.

Scattered coronary calcifications. Adequate contrast opacification
of the thoracic aorta with no evidence of dissection, aneurysm, or
stenosis. There is classic 3-vessel brachiocephalic arch anatomy
without proximal stenosis. Mild calcified plaque in the aortic arch.

Mediastinum/Nodes: No pericardial effusion. Stable 1 cm right hilar
node. No new mediastinal adenopathy. Endotracheal tube to the distal
trachea. Nasogastric tube placement to the stomach.

Lungs/Pleura: Persistent right suprahilar and contiguous apical
masslike consolidation. Resolution of right pleural effusion since
prior study.

1.1 cm and 1.9 cm left apical pulmonary nodules as before.

New extensive patchy airspace opacities in the superior and
posterior basal segments of both lower lobes with more dense basilar
consolidation left greater than right.

Upper Abdomen: No acute abnormality.

Musculoskeletal: No chest wall abnormality. No acute or significant
osseous findings.

Review of the MIP images confirms the above findings.
IMPRESSION: 1. Negative for acute PE or thoracic aortic dissection.
2. New extensive patchy airspace opacities in both lower lobes
probably infectious/inflammatory.
3. Coronary and Aortic Atherosclerosis ([id number]-170.0).
4. Stable right apical/suprahilar mass and left apical pulmonary
nodules.
5. Interval resolution of right pleural effusion
6. Fibrin sheath/thrombus at the tip of the port catheter with
continued patency of the SVC.

## 2018-03-12 IMAGING — DX DG CHEST 1V PORT
1 series · 2 of 2 positions shown · non-contrast
Comparison: 12/17/2016 chest radiograph

CLINICAL DATA: 61 y/o  M; sudden onset arrhythmia.

EXAM:
PORTABLE CHEST 1 VIEW

[Series 1: chest ap · 0.14mm/px · 2 of 2 slices shown]
[im 1/2]
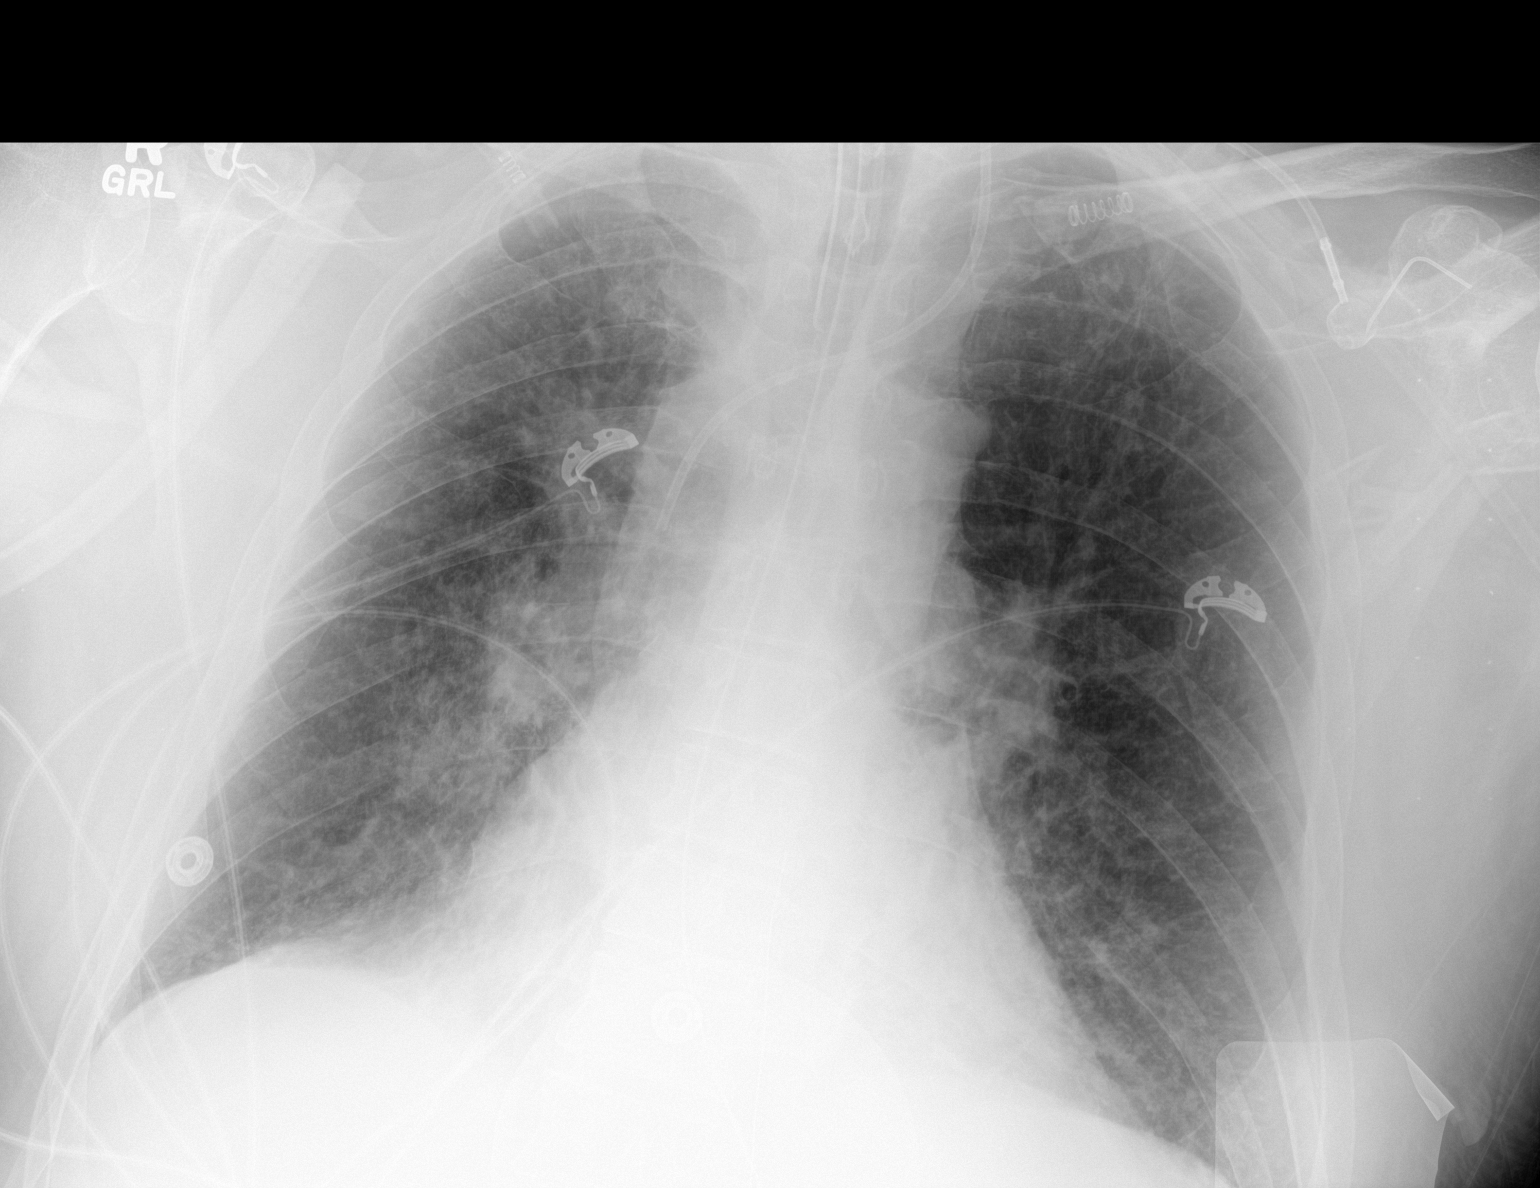
[im 2/2]
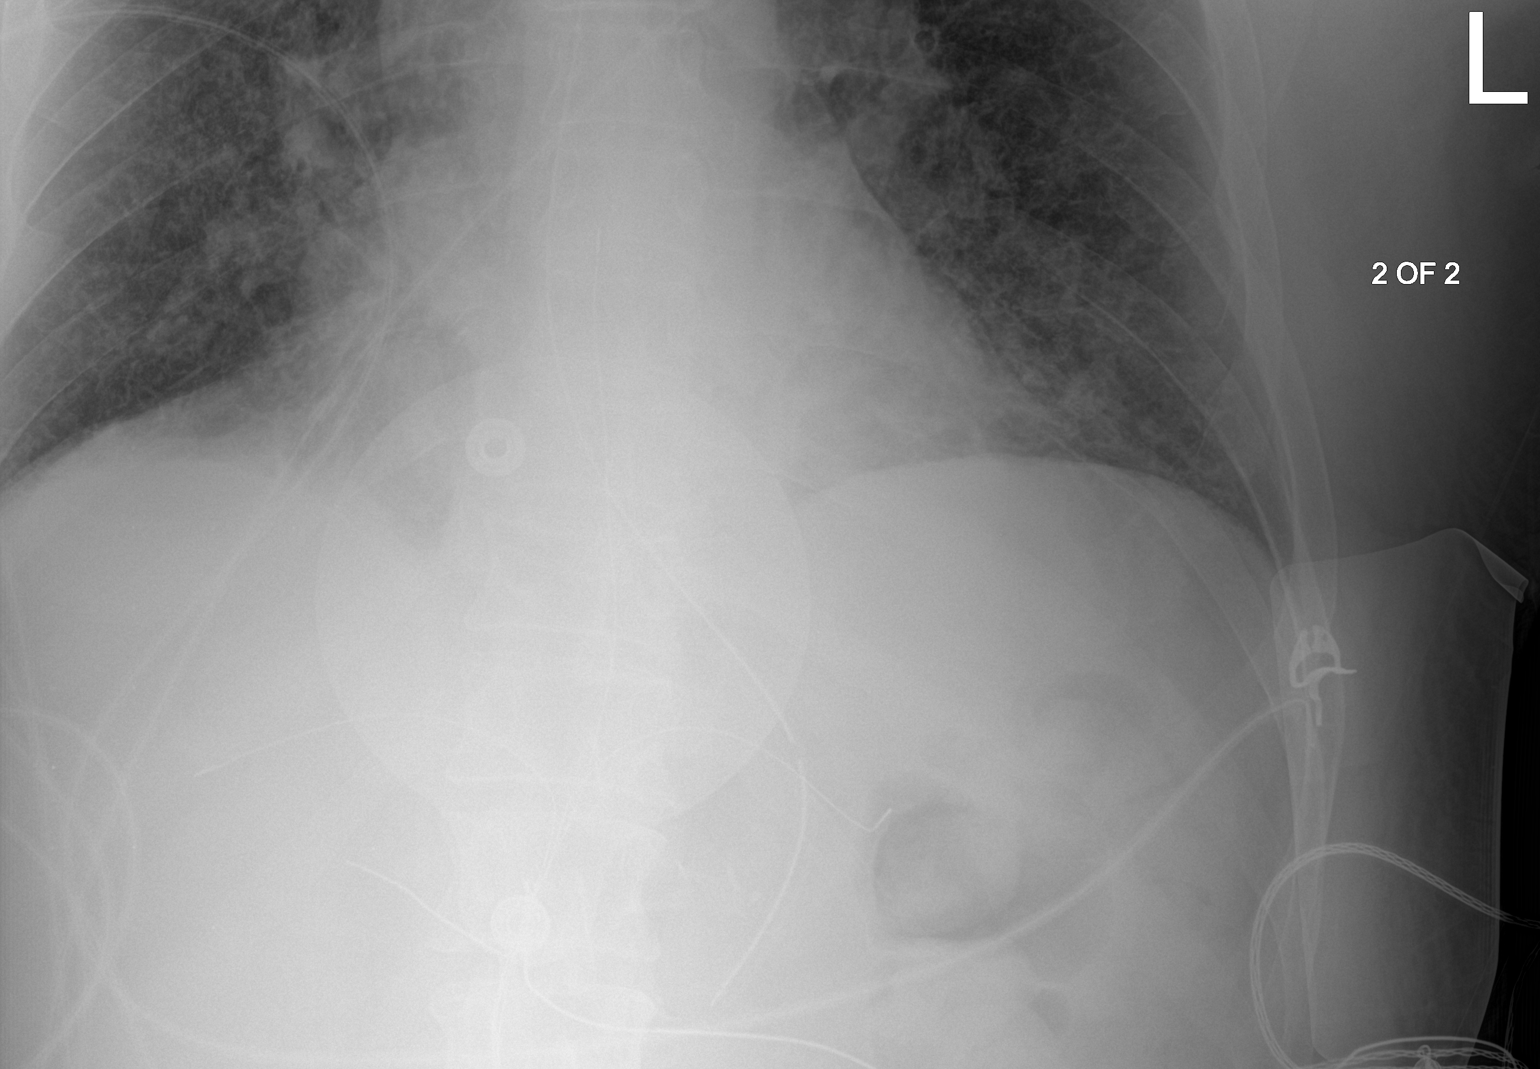

[2 of 2 positions shown; findings below may reference images not displayed]

FINDINGS: Stable cardiac silhouette given projection and technique.
Endotracheal tube tip 4.8 cm from carina. Enteric tube tip below the
field of view in the abdomen. Left central venous catheter tip
projects over mid SVC. Interval increase in reticular markings the
lungs likely represents interval development of mild interstitial
pulmonary edema. Patchy opacities at lung bases may represent
atelectasis or infiltrates. No acute osseous abnormality identified.
IMPRESSION: Increased reticular markings within the lungs compatible with
interstitial pulmonary edema. Stable minor bibasilar atelectasis
and/or pneumonia. Stable lines and tubes.

By: Juslhomme Emilsy M.D.
